# Patient Record
Sex: Female | Born: 1945 | Race: Black or African American | Hispanic: No | Smoking: Never smoker
Health system: Southern US, Community
[De-identification: ages and names within clinical notes are randomized; demographics above are authoritative.]

## PROBLEM LIST (undated history)

## (undated) DIAGNOSIS — E119 Type 2 diabetes mellitus without complications: Secondary | ICD-10-CM

## (undated) DIAGNOSIS — E7800 Pure hypercholesterolemia, unspecified: Secondary | ICD-10-CM

## (undated) DIAGNOSIS — N179 Acute kidney failure, unspecified: Secondary | ICD-10-CM

## (undated) DIAGNOSIS — D649 Anemia, unspecified: Secondary | ICD-10-CM

## (undated) DIAGNOSIS — I10 Essential (primary) hypertension: Secondary | ICD-10-CM

## (undated) HISTORY — DX: Essential (primary) hypertension: I10

## (undated) HISTORY — DX: Anemia, unspecified: D64.9

## (undated) HISTORY — DX: Type 2 diabetes mellitus without complications: E11.9

## (undated) HISTORY — DX: Acute kidney failure, unspecified: N17.9

## (undated) HISTORY — PX: OOPHORECTOMY: SHX86

---

## 1973-10-17 HISTORY — PX: TUBAL LIGATION: SHX77

## 1977-10-17 HISTORY — PX: MICROLARYNGOSCOPY WITH CO2 LASER AND EXCISION OF VOCAL CORD LESION: SHX5970

## 1979-10-18 HISTORY — PX: ABDOMINAL HYSTERECTOMY: SHX81

## 1999-10-18 DIAGNOSIS — E119 Type 2 diabetes mellitus without complications: Secondary | ICD-10-CM

## 1999-10-18 HISTORY — DX: Type 2 diabetes mellitus without complications: E11.9

## 2003-10-18 HISTORY — PX: BREAST BIOPSY: SHX20

## 2007-05-21 ENCOUNTER — Ambulatory Visit: Payer: Self-pay | Admitting: Internal Medicine

## 2007-08-22 ENCOUNTER — Ambulatory Visit: Payer: Self-pay | Admitting: Internal Medicine

## 2008-04-14 ENCOUNTER — Ambulatory Visit: Payer: Self-pay | Admitting: Internal Medicine

## 2008-05-29 ENCOUNTER — Ambulatory Visit: Payer: Self-pay | Admitting: Gastroenterology

## 2010-01-21 ENCOUNTER — Ambulatory Visit: Payer: Self-pay | Admitting: Internal Medicine

## 2011-01-27 ENCOUNTER — Ambulatory Visit: Payer: Self-pay | Admitting: Internal Medicine

## 2013-06-03 ENCOUNTER — Ambulatory Visit: Payer: Self-pay | Admitting: Internal Medicine

## 2013-10-17 HISTORY — PX: BREAST BIOPSY: SHX20

## 2013-10-31 ENCOUNTER — Ambulatory Visit: Payer: Self-pay | Admitting: Internal Medicine

## 2013-11-12 ENCOUNTER — Encounter: Payer: Self-pay | Admitting: *Deleted

## 2013-12-05 ENCOUNTER — Encounter: Payer: Self-pay | Admitting: General Surgery

## 2013-12-05 ENCOUNTER — Ambulatory Visit (INDEPENDENT_AMBULATORY_CARE_PROVIDER_SITE_OTHER): Payer: Medicare Other | Admitting: General Surgery

## 2013-12-05 VITALS — BP 138/74 | HR 86 | Resp 14 | Ht 63.0 in | Wt 142.0 lb

## 2013-12-05 DIAGNOSIS — D249 Benign neoplasm of unspecified breast: Secondary | ICD-10-CM

## 2013-12-05 NOTE — Patient Instructions (Signed)
Continue self breast exams. Call office for any new breast issues or concerns. 

## 2013-12-05 NOTE — Progress Notes (Signed)
Patient ID: Patricia Cross, female   DOB: [birth year], 68 y.o.   MRN: [id number]  Chief Complaint  Patient presents with  . Breast Problem    abnormal mammogram    HPI Patricia Cross is a 69 y.o. female.  who presents for a breast evaluation. The most recent mammogram was done on 11-01-13. An ultrasound was done as well. Patient does perform regular self breast checks and gets regular mammograms done.  No family history of breast cancer.States she felt the "hard spot" right breast back around Christmas and it has not change in size. Denies pain or any other breast symptoms.  HPI  Past Medical History  Diagnosis Date  . Diabetes mellitus without complication   . Hypertension   . Anemia     Past Surgical History  Procedure Laterality Date  . Abdominal hysterectomy  1981  . Tubal ligation  1975  . Microlaryngoscopy with co2 laser and excision of vocal cord lesion  1979  . Breast biopsy Left 2005    Family History  Problem Relation Age of Onset  . Cancer Daughter 67    pancreatic    Social History History  Substance Use Topics  . Smoking status: Never Smoker   . Smokeless tobacco: Not on file  . Alcohol Use: No    Allergies not on file  Current Outpatient Prescriptions  Medication Sig Dispense Refill  . amLODipine (NORVASC) 5 MG tablet Take 5 mg by mouth daily.       Marland Kitchen glyBURIDE (DIABETA) 5 MG tablet Take 5 mg by mouth 3 (three) times daily.       Marland Kitchen losartan-hydrochlorothiazide (HYZAAR) 100-25 MG per tablet Take 1 tablet by mouth daily.       . metFORMIN (GLUCOPHAGE) 1000 MG tablet Take 1,000 mg by mouth 2 (two) times daily with a meal.       . ONGLYZA 5 MG TABS tablet 5 mg daily.       . pantoprazole (PROTONIX) 40 MG tablet Take 40 mg by mouth daily.       Marland Kitchen PREMARIN 0.45 MG tablet Take 0.45 mg by mouth daily.       . simvastatin (ZOCOR) 80 MG tablet Take 80 mg by mouth daily at 6 PM.       . SUPREP BOWEL PREP SOLN        No current facility-administered  medications for this visit.    Review of Systems Review of Systems  Constitutional: Negative.   Respiratory: Negative.   Cardiovascular: Negative.     Blood pressure 138/74, pulse 86, resp. rate 14, height 5\' 3"  (1.6 m), weight 142 lb (64.411 kg).  Physical Exam Physical Exam  Constitutional: She is oriented to person, place, and time. She appears well-developed and well-nourished.  Neck: Neck supple.  Cardiovascular: Normal rate, regular rhythm and normal heart sounds.   Pulmonary/Chest: Effort normal and breath sounds normal. Right breast exhibits mass. Right breast exhibits no inverted nipple, no nipple discharge, no skin change and no tenderness. Left breast exhibits no inverted nipple, no mass, no nipple discharge, no skin change and no tenderness.  Right breast nodule 4 o'clock   Lymphadenopathy:    She has no cervical adenopathy.  Neurological: She is alert and oriented to person, place, and time.  Skin: Skin is warm and dry.    Data Reviewed 08/22/2007 bilateral screening mammograms were reviewed. This shows a 0.8 x 1.2 x 1.25 cm well-circumscribed subcutaneous nodule in the right retroareolar tissue. BI-RAD-3 10/31/2013 right breast  diagnostic mammogram shows a 1.2 x 1.95 x 1.95 slightly lobulated subcutaneous mass in the right retroareolar tissue. BI-RAD-3   Assessment    Right retroareolar fibroadenoma, now palpable. Modest change from 1.2-1.95 cm over 9 years.     Plan    The patient had been encouraged to have a followup exam in 6 months. She is now anxious that there is palpable. She was reassured that this is likely a benign lesion but desired to proceed to excision. This can safely be accomplished as an office procedure.       Robert Bellow 12/06/2013, 12:30 PM

## 2013-12-06 DIAGNOSIS — D249 Benign neoplasm of unspecified breast: Secondary | ICD-10-CM | POA: Insufficient documentation

## 2013-12-23 ENCOUNTER — Ambulatory Visit (INDEPENDENT_AMBULATORY_CARE_PROVIDER_SITE_OTHER): Payer: Medicare Other | Admitting: General Surgery

## 2013-12-23 ENCOUNTER — Encounter: Payer: Self-pay | Admitting: General Surgery

## 2013-12-23 VITALS — BP 130/62 | HR 74 | Resp 13 | Ht 63.0 in | Wt 139.0 lb

## 2013-12-23 DIAGNOSIS — N63 Unspecified lump in unspecified breast: Secondary | ICD-10-CM

## 2013-12-23 NOTE — Progress Notes (Signed)
Patient ID: Patricia Cross, female   DOB: 1946/01/09, 68 y.o.   MRN: [id number]  Chief Complaint  Patient presents with  . Procedure    right breast mass    HPI Patricia Cross is a 68 y.o. female here today for an right breast excision.   HPI  Past Medical History  Diagnosis Date  . Diabetes mellitus without complication   . Hypertension   . Anemia     Past Surgical History  Procedure Laterality Date  . Abdominal hysterectomy  1981  . Tubal ligation  1975  . Microlaryngoscopy with co2 laser and excision of vocal cord lesion  1979  . Breast biopsy Left 2005    Family History  Problem Relation Age of Onset  . Cancer Daughter 29    pancreatic    Social History History  Substance Use Topics  . Smoking status: Never Smoker   . Smokeless tobacco: Never Used  . Alcohol Use: No    No Known Allergies  Current Outpatient Prescriptions  Medication Sig Dispense Refill  . amLODipine (NORVASC) 5 MG tablet Take 5 mg by mouth daily.       Marland Kitchen glyBURIDE (DIABETA) 5 MG tablet Take 5 mg by mouth 3 (three) times daily.       Marland Kitchen losartan-hydrochlorothiazide (HYZAAR) 100-25 MG per tablet Take 1 tablet by mouth daily.       . metFORMIN (GLUCOPHAGE) 1000 MG tablet Take 1,000 mg by mouth 2 (two) times daily with a meal.       . ONGLYZA 5 MG TABS tablet 5 mg daily.       . pantoprazole (PROTONIX) 40 MG tablet Take 40 mg by mouth daily.       Marland Kitchen PREMARIN 0.45 MG tablet Take 0.45 mg by mouth daily.       . simvastatin (ZOCOR) 80 MG tablet Take 80 mg by mouth daily at 6 PM.       . SUPREP BOWEL PREP SOLN        No current facility-administered medications for this visit.    Review of Systems Review of Systems  Constitutional: Negative.   Respiratory: Negative.   Cardiovascular: Negative.     Blood pressure 130/62, pulse 74, resp. rate 13, height 5\' 3"  (1.6 m), weight 139 lb (63.05 kg).  Physical Exam Physical Exam  Pulmonary/Chest: Right breast exhibits mass (1.5 cm massin the  edge of the areola at the 4 o'clock position of the right breast. Confirmed by palpation with the patient prior to the instillation of local anesthesia.).    Data Reviewed Previous ultrasound examinations.  Assessment    Fibroadenoma of the right breast.     Plan    The patient desired to proceed with excision. The area was marked after confirming the area of palpable concern with the patient. 20 cc of 0.5% Xylocaine with 0.25% Marcaine with 1-200,000 units of epinephrine was used. Chlor prep was applied to the skin. 3 cc of 1% plain Xylocaine was then instilled to achieve excellent anesthesia. A small incision along the edge of the areola was made and carried down through skin and subcutaneous tissue. The nodular tissue was excised sharply and orientated, short suture medially, long suture superiorly and placed in formalin for routine histology. The deep tissue was approximated with 3-0 Vicryl suture. The skin was closed with a running 3-0 Vicryl subcuticular suture. Benzoin and Steri-Strips followed by Telfa Tegaderm dressing was applied. The procedure was well tolerated. Home going instruction sheet provided.  A  prescription for Norco 5/325, #20 with the inscription 1 p.o. Q.4 h. P.r.n. For pain with no refills was provided.  The patient will follow up in one week for wound examination with the staff.       Robert Bellow 12/23/2013, 10:04 PM

## 2013-12-23 NOTE — Patient Instructions (Addendum)
CARE AFTER BREAST  Excision  1. Leave the dressing on that your doctor applied after surgery. It is waterproof. You may bathe, shower and/or swim. The dressing will probably remain intact until your return office visit. If the dressing comes off, you will see small strips of tape against your skin on the incision. Do not remove these strips.  2. You may want to use a gauze,cloth or similar protection in your bra to prevent rubbing against your dressing and incision. This is not necessary, but you may feel more comfortable doing so.  3. It is recommended that you wear a bra day and night to give support to the breast. This will prevent the weight of the breast from pulling on the incision.  4. Your breast will feel hard and lumpy under the incision. Do not be alarmed. This is the underlying stitching of tissue. Softening of this tissue will occur in time.  5. Make sure you call the office and schedule an appointment in one week after your surgery. The office phone number is [phone]. The [facility] may have already done this for you.  6. You will notice about a week after your office visit that the strips of the tape on your incision will begin to loosen. These may then be removed.  7. Report to your doctor any of the following:  * Severe pain not relieved by your pain medication  *Redness of the incision  * Drainage from the incision  *Fever greater than 101 degrees 

## 2013-12-24 LAB — PATHOLOGY

## 2013-12-26 ENCOUNTER — Telehealth: Payer: Self-pay | Admitting: *Deleted

## 2013-12-26 NOTE — Telephone Encounter (Signed)
Notified patient as instructed, no cancer noted, patient pleased. Discussed follow-up appointments, patient agrees

## 2013-12-30 ENCOUNTER — Ambulatory Visit (INDEPENDENT_AMBULATORY_CARE_PROVIDER_SITE_OTHER): Payer: Medicare Other | Admitting: *Deleted

## 2013-12-30 DIAGNOSIS — N63 Unspecified lump in unspecified breast: Secondary | ICD-10-CM

## 2013-12-30 NOTE — Progress Notes (Signed)
Patient came in today for a wound check.  The wound is clean, with no signs of infection noted. .Follow up as scheduled.  

## 2014-01-06 ENCOUNTER — Telehealth: Payer: Self-pay | Admitting: *Deleted

## 2014-01-06 NOTE — Telephone Encounter (Signed)
Left message that patient is to continue screening mammograms yearly with her PCP.

## 2014-01-27 ENCOUNTER — Ambulatory Visit: Payer: Self-pay | Admitting: Internal Medicine

## 2014-01-27 LAB — CANCER CENTER HEMOGLOBIN: HGB: 10.5 g/dL — ABNORMAL LOW (ref 12.0–16.0)

## 2014-01-27 LAB — FERRITIN: FERRITIN (ARMC): 7 ng/mL — AB (ref 8–388)

## 2014-02-14 ENCOUNTER — Ambulatory Visit: Payer: Self-pay | Admitting: Internal Medicine

## 2014-02-14 LAB — OCCULT BLOOD X 1 CARD TO LAB, STOOL
Occult Blood, Feces: NEGATIVE
Occult Blood, Feces: NEGATIVE
Occult Blood, Feces: NEGATIVE

## 2014-02-17 LAB — CANCER CENTER HEMOGLOBIN: HGB: 10.1 g/dL — AB (ref 12.0–16.0)

## 2014-03-17 ENCOUNTER — Ambulatory Visit: Payer: Self-pay | Admitting: Internal Medicine

## 2014-03-19 LAB — CANCER CENTER HEMOGLOBIN: HGB: 10.9 g/dL — ABNORMAL LOW (ref 12.0–16.0)

## 2014-04-16 ENCOUNTER — Ambulatory Visit: Payer: Self-pay | Admitting: Internal Medicine

## 2014-06-19 ENCOUNTER — Ambulatory Visit: Payer: Self-pay | Admitting: Internal Medicine

## 2014-08-18 ENCOUNTER — Encounter: Payer: Self-pay | Admitting: General Surgery

## 2014-08-28 IMAGING — MG MM MAMMO DIAGNOSTIC UNILATERAL*R*
1 series · 3 of 3 positions shown · non-contrast
Comparison: Priors

CLINICAL DATA: Patient presents for evaluation of palpable
abnormality within the right breast.

EXAM:
DIGITAL DIAGNOSTIC  RIGHT MAMMOGRAM WITH CAD
ULTRASOUND RIGHT BREAST

[R CC · right · 3 of 3 slices shown]
[im 1/3]
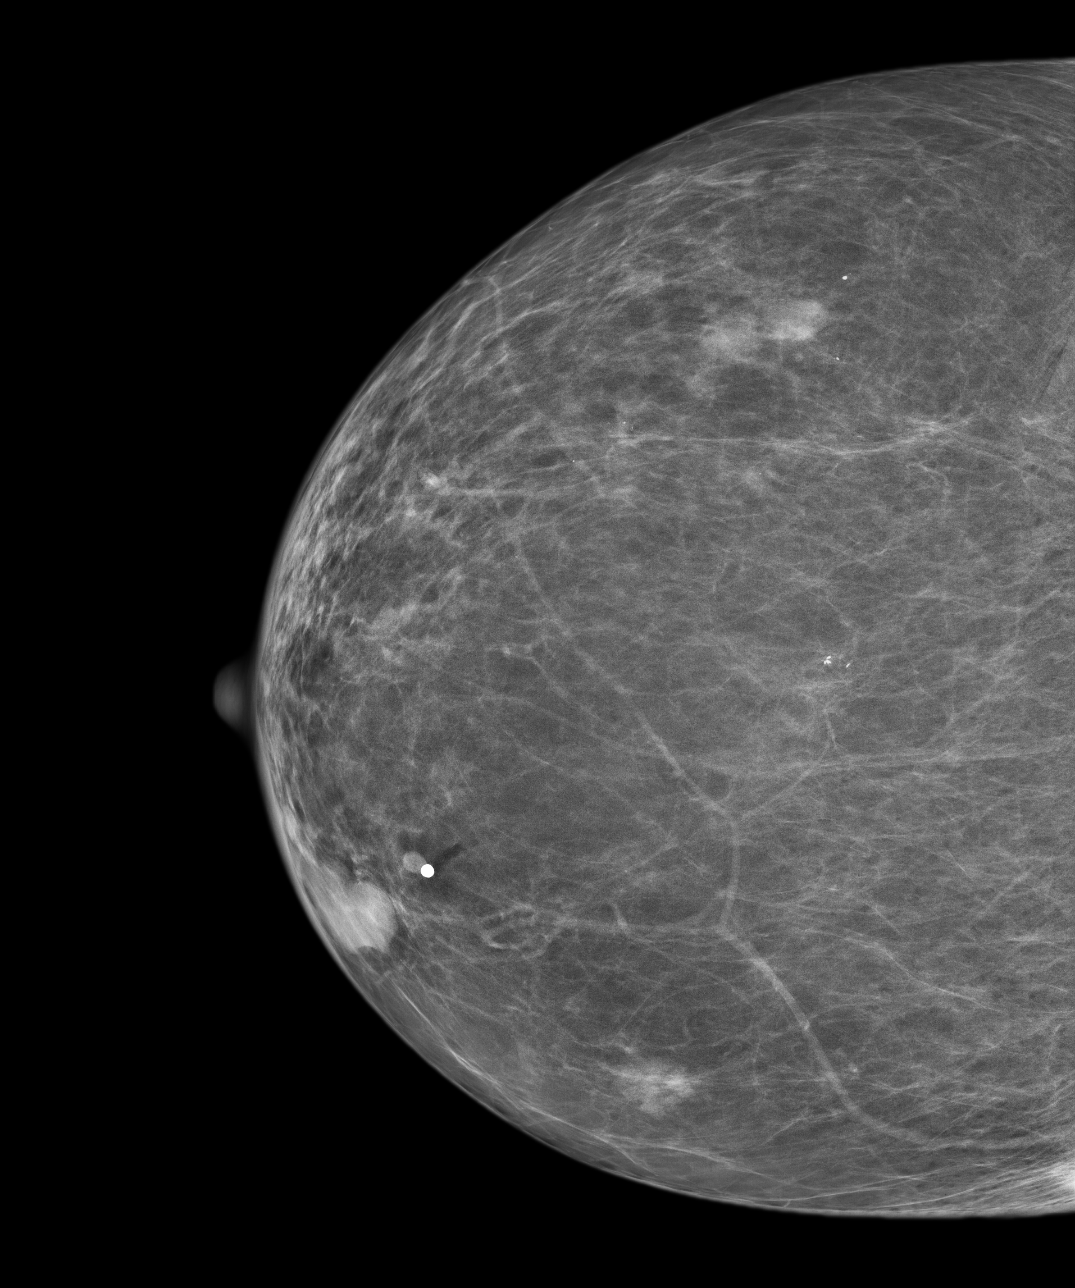
[im 2/3]
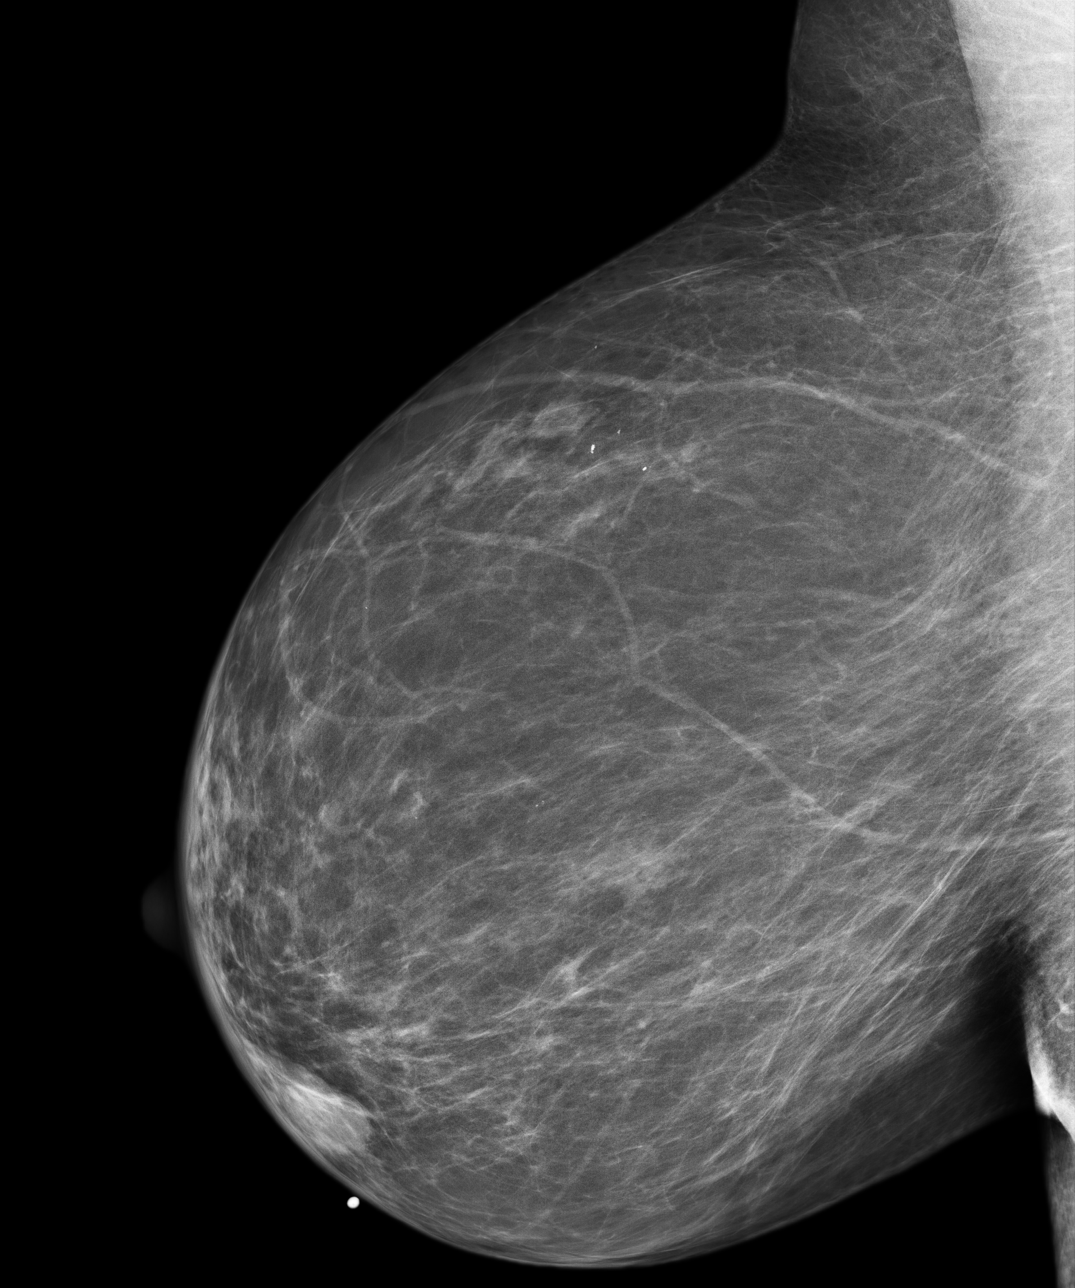
[im 3/3]
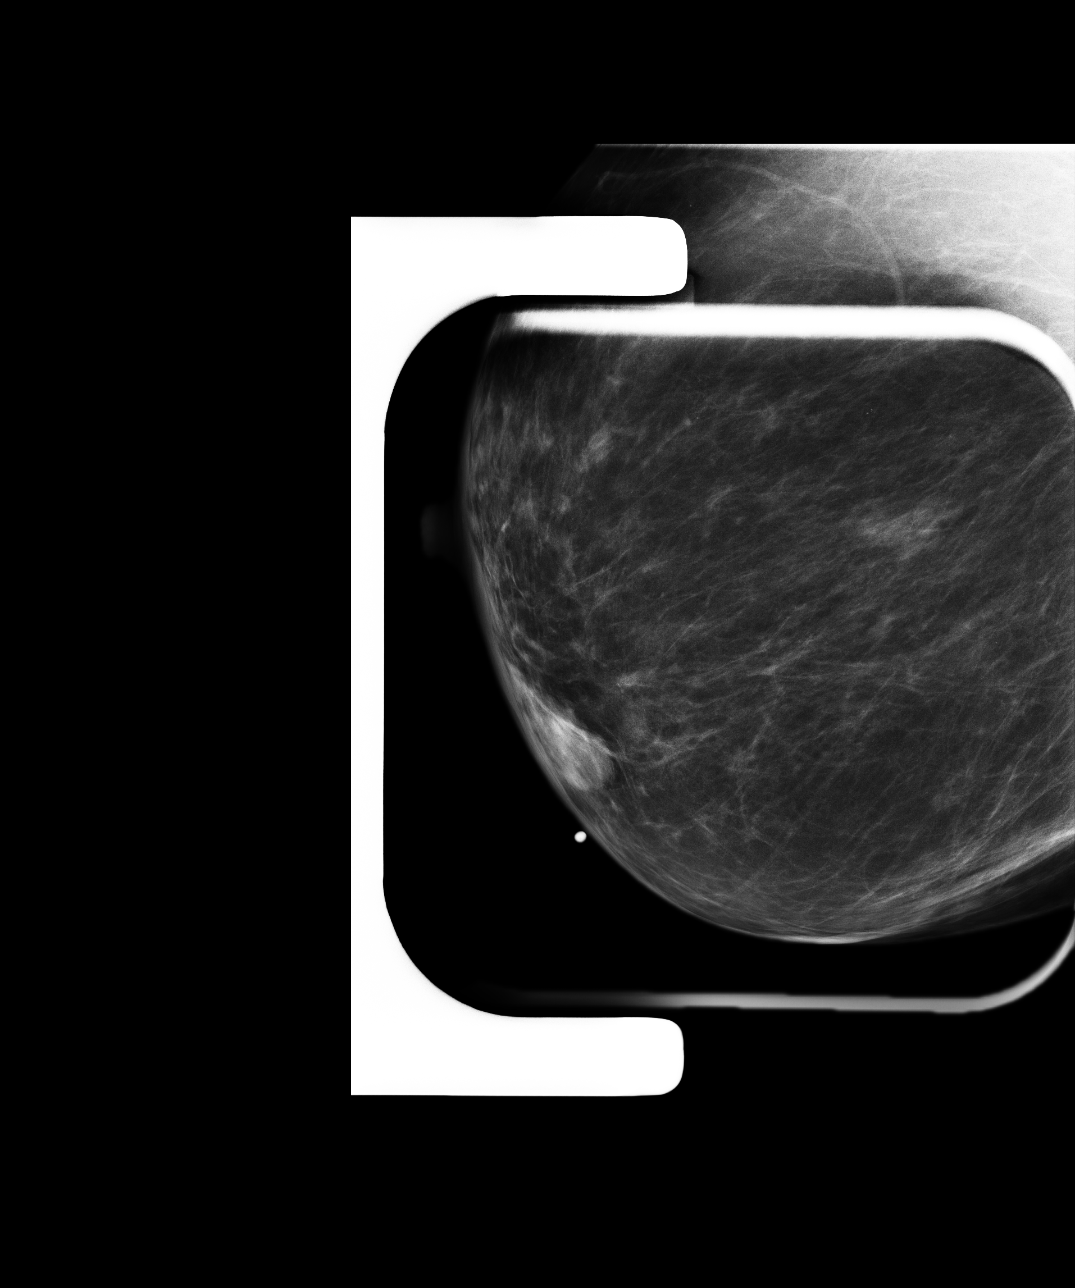

[3 of 3 positions shown; findings below may reference images not displayed]

ACR Breast Density Category b: There are scattered areas of
fibroglandular density.
FINDINGS: Within the right breast underlying the palpable marker there is a
2.0 cm circumscribed oval mass. This mass is stable dating back to
08/22/2007. This is compatible with a benign process.

Mammographic images were processed with CAD.

On physical exam, I palpate a soft mass underlying the skin within
the right breast.

Ultrasound is performed, showing a 1.5 x 0.7 x 1.7 cm circumscribed
hypoechoic mass within the right breast 4 o'clock position 1 cm from
the nipple.
IMPRESSION: Palpable mass is stable on mammogram dating back to 1006 compatible
with a benign etiology.

The stability of this mass and the fact this represents a benign
etiology given stability were discussed with the patient. She stated
that she would like to have a repeat ultrasound in 6 months.

RECOMMENDATION:
It was discussed with the patient that she could return to bilateral
screening mammography however she states she desires to have a
followup ultrasound of this mass in 6 months.

I have discussed the findings and recommendations with the patient.
Results were also provided in writing at the conclusion of the
visit.

BI-RADS CATEGORY  3: Probably benign finding(s) - short interval
follow-up suggested.

## 2015-01-29 ENCOUNTER — Ambulatory Visit
Admit: 2015-01-29 | Disposition: A | Discharge status: Home / Self Care | Payer: Self-pay | Attending: Internal Medicine | Admitting: Internal Medicine

## 2016-01-06 ENCOUNTER — Encounter: Payer: Self-pay | Admitting: *Deleted

## 2016-01-07 ENCOUNTER — Encounter
Admission: RE | Disposition: A | Discharge status: Home / Self Care | Payer: Self-pay | Source: Ambulatory Visit | Attending: Gastroenterology

## 2016-01-07 ENCOUNTER — Encounter: Payer: Self-pay | Admitting: Gastroenterology

## 2016-01-07 ENCOUNTER — Ambulatory Visit: Payer: Medicare Other | Admitting: Anesthesiology

## 2016-01-07 ENCOUNTER — Ambulatory Visit
Admission: RE | Admit: 2016-01-07 | Discharge: 2016-01-07 | Disposition: A | Discharge status: Home / Self Care | Payer: Medicare Other | Source: Ambulatory Visit | Attending: Gastroenterology | Admitting: Gastroenterology

## 2016-01-07 DIAGNOSIS — Z7984 Long term (current) use of oral hypoglycemic drugs: Secondary | ICD-10-CM | POA: Insufficient documentation

## 2016-01-07 DIAGNOSIS — Z9889 Other specified postprocedural states: Secondary | ICD-10-CM | POA: Diagnosis not present

## 2016-01-07 DIAGNOSIS — E119 Type 2 diabetes mellitus without complications: Secondary | ICD-10-CM | POA: Insufficient documentation

## 2016-01-07 DIAGNOSIS — Z800 Family history of malignant neoplasm of digestive organs: Secondary | ICD-10-CM | POA: Diagnosis not present

## 2016-01-07 DIAGNOSIS — I10 Essential (primary) hypertension: Secondary | ICD-10-CM | POA: Insufficient documentation

## 2016-01-07 DIAGNOSIS — Z86010 Personal history of colonic polyps: Secondary | ICD-10-CM | POA: Insufficient documentation

## 2016-01-07 DIAGNOSIS — Z8719 Personal history of other diseases of the digestive system: Secondary | ICD-10-CM | POA: Insufficient documentation

## 2016-01-07 DIAGNOSIS — Z90710 Acquired absence of both cervix and uterus: Secondary | ICD-10-CM | POA: Insufficient documentation

## 2016-01-07 DIAGNOSIS — E7800 Pure hypercholesterolemia, unspecified: Secondary | ICD-10-CM | POA: Insufficient documentation

## 2016-01-07 DIAGNOSIS — R159 Full incontinence of feces: Secondary | ICD-10-CM | POA: Diagnosis not present

## 2016-01-07 HISTORY — DX: Pure hypercholesterolemia, unspecified: E78.00

## 2016-01-07 HISTORY — PX: COLONOSCOPY WITH PROPOFOL: SHX5780

## 2016-01-07 LAB — GLUCOSE, CAPILLARY: Glucose-Capillary: 132 mg/dL — ABNORMAL HIGH (ref 65–99)

## 2016-01-07 SURGERY — COLONOSCOPY WITH PROPOFOL
Anesthesia: General

## 2016-01-07 MED ORDER — SODIUM CHLORIDE 0.9 % IV SOLN: INTRAVENOUS | Status: DC

## 2016-01-07 MED ORDER — PROPOFOL 500 MG/50ML IV EMUL
INTRAVENOUS | Status: DC | PRN
  Administered 2016-01-07: 100 ug/kg/min via INTRAVENOUS

## 2016-01-07 MED ORDER — PROPOFOL 10 MG/ML IV BOLUS
INTRAVENOUS | Status: DC | PRN
  Administered 2016-01-07: 80 mg via INTRAVENOUS

## 2016-01-07 MED ORDER — SODIUM CHLORIDE 0.9 % IV SOLN
INTRAVENOUS | Status: DC
Start: 2016-01-07 — End: 2016-01-07
  Administered 2016-01-07: 1000 mL via INTRAVENOUS

## 2016-01-07 NOTE — Transfer of Care (Signed)
Immediate Anesthesia Transfer of Care Note  Patient: Patricia Cross  Procedure(s) Performed: Procedure(s): COLONOSCOPY WITH PROPOFOL (N/A)  Patient Location: endo  Anesthesia Type:General  Level of Consciousness: sedated and responds to stimulation  Airway & Oxygen Therapy: Patient Spontanous Breathing and Patient connected to nasal cannula oxygen  Post-op Assessment: Report given to RN and Post -op Vital signs reviewed and stable  Post vital signs: Reviewed and stable  Last Vitals:  Filed Vitals:   01/07/16 0813 01/07/16 0943  BP: 141/58 120/54  Pulse: 97 83  Temp: 35.9 C 36.2 C  Resp: 16 18    Complications: No apparent anesthesia complications

## 2016-01-07 NOTE — H&P (Signed)
    Primary Care Physician:  Perrin Maltese, MD Primary Gastroenterologist:  Dr. Candace Cruise  Pre-Procedure History & Physical: HPI:  Patricia Cross is a 70 y.o. female is here for an colonoscopy  Past Medical History  Diagnosis Date  . Diabetes mellitus without complication ([place])   . Hypertension   . Anemia   . High cholesterol     Past Surgical History  Procedure Laterality Date  . Abdominal hysterectomy  1981  . Tubal ligation  1975  . Microlaryngoscopy with co2 laser and excision of vocal cord lesion  1979  . Breast biopsy Left 2005    Prior to Admission medications   Medication Sig Start Date End Date Taking? Authorizing Provider  amLODipine (NORVASC) 5 MG tablet Take 5 mg by mouth daily.  12/02/13  Yes Historical Provider, MD  glipiZIDE (GLUCOTROL) 10 MG tablet Take 10 mg by mouth 2 (two) times daily before a meal.   Yes Historical Provider, MD  losartan-hydrochlorothiazide (HYZAAR) 100-25 MG per tablet Take 1 tablet by mouth daily.  12/02/13  Yes Historical Provider, MD  metFORMIN (GLUCOPHAGE) 1000 MG tablet Take 1,000 mg by mouth 2 (two) times daily with a meal.  12/02/13  Yes Historical Provider, MD  ONGLYZA 5 MG TABS tablet 5 mg daily.  11/23/13  Yes Historical Provider, MD  pantoprazole (PROTONIX) 40 MG tablet Take 40 mg by mouth daily.  12/03/13  Yes Historical Provider, MD  PREMARIN 0.45 MG tablet Take 0.45 mg by mouth daily.  11/22/13  Yes Historical Provider, MD  simvastatin (ZOCOR) 80 MG tablet Take 80 mg by mouth daily at 6 PM.  11/23/13  Yes Historical Provider, MD  [place]  11/26/13  Yes Historical Provider, MD    Allergies as of 12/24/2015  . (No Known Allergies)    Family History  Problem Relation Age of Onset  . Cancer Daughter 36    pancreatic    Social History   Social History  . Marital Status: Widowed    Spouse Name: N/A  . Number of Children: N/A  . Years of Education: N/A   Occupational History  . Not on file.   Social History Main  Topics  . Smoking status: Never Smoker   . Smokeless tobacco: Never Used  . Alcohol Use: No  . Drug Use: No  . Sexual Activity: Not on file   Other Topics Concern  . Not on file   Social History Narrative    Review of Systems: See HPI, otherwise negative ROS  Physical Exam: BP 141/58 mmHg  Pulse 97  Temp(Src) 96.6 F (35.9 C) (Tympanic)  Resp 16  Ht 5\' 3"  (1.6 m)  Wt 62.596 kg (138 lb)  BMI 24.45 kg/m2  SpO2 99% General:   Alert,  pleasant and cooperative in NAD Head:  Normocephalic and atraumatic. Neck:  Supple; no masses or thyromegaly. Lungs:  Clear throughout to auscultation.    Heart:  Regular rate and rhythm. Abdomen:  Soft, nontender and nondistended. Normal bowel sounds, without guarding, and without rebound.   Neurologic:  Alert and  oriented x4;  grossly normal neurologically.  Impression/Plan: Patricia Cross is here for an colonoscopy to be performed for fecal incontinence  Risks, benefits, limitations, and alternatives regarding  colonoscopy} have been reviewed with the patient.  Questions have been answered.  All parties agreeable.   Patricia Cross, Patricia Dawn, MD  01/07/2016, 8:31 AM

## 2016-01-07 NOTE — Anesthesia Postprocedure Evaluation (Signed)
Anesthesia Post Note  Patient: Patricia Cross  Procedure(s) Performed: Procedure(s) (LRB): COLONOSCOPY WITH PROPOFOL (N/A)  Patient location during evaluation: PACU Anesthesia Type: General Level of consciousness: awake and alert Pain management: pain level controlled Vital Signs Assessment: post-procedure vital signs reviewed and stable Respiratory status: spontaneous breathing and respiratory function stable Cardiovascular status: stable Anesthetic complications: no    Last Vitals:  Filed Vitals:   01/07/16 1010 01/07/16 1020  BP:  148/66  Pulse: 67 78  Temp:    Resp:      Last Pain: There were no vitals filed for this visit.               KEPHART,WILLIAM K

## 2016-01-07 NOTE — Op Note (Signed)
[facility] Patient Name: Patricia Cross Procedure Date: 01/07/2016 9:17 AM MRN: [id number] Account #: [id number] Date of Birth: [birth year] Admit Type: Outpatient Age: 70 Room: [facility] ENDO ROOM 4 Gender: Female Note Status: Finalized Procedure:            Colonoscopy Indications:          Chronic diarrhea, fecal incontinence Providers:            Lupita Dawn. Candace Cruise, MD Referring MD:         Perrin Maltese, MD (Referring MD) Medicines:            Previous colon biopsy suggests lymphocytic colitis,                        Monitored Anesthesia Care Complications:        No immediate complications. Procedure:            Pre-Anesthesia Assessment:                       - Prior to the procedure, a History and Physical was                        performed, and patient medications, allergies and                        sensitivities were reviewed. The patient's tolerance of                        previous anesthesia was reviewed.                       - The risks and benefits of the procedure and the                        sedation options and risks were discussed with the                        patient. All questions were answered and informed                        consent was obtained.                       - After reviewing the risks and benefits, the patient                        was deemed in satisfactory condition to undergo the                        procedure.                       After obtaining informed consent, the colonoscope was                        passed under direct vision. Throughout the procedure,                        the patient's blood pressure, pulse, and oxygen  saturations were monitored continuously. The                        Colonoscope was introduced through the anus and                        advanced to the the cecum, identified by appendiceal                        orifice and ileocecal valve. The  colonoscopy was                        performed with moderate difficulty due to restricted                        mobility of the colon. The patient tolerated the                        procedure well. The quality of the bowel preparation                        was good. Findings:      The colon (entire examined portion) appeared normal. Biopsies for       histology were taken with a cold forceps from the entire colon for       evaluation of microscopic colitis.      Decreased sphincter tone as well. Impression:           - The entire examined colon is normal. Biopsied. Recommendation:       - Discharge patient to home.                       - Await pathology results.                       - The findings and recommendations were discussed with                        the patient. Procedure Code(s):    --- Professional ---                       [id number], Colonoscopy, flexible; with biopsy, single or                        multiple Diagnosis Code(s):    --- Professional ---                       K52.9, Noninfective gastroenteritis and colitis,                        unspecified CPT copyright 2016 [employer] Medical Association. All rights reserved. The codes documented in this report are preliminary and upon coder review may  be revised to meet current compliance requirements. Hulen Luster, MD 01/07/2016 9:41:20 AM This report has been signed electronically. Number of Addenda: 0 Note Initiated On: 01/07/2016 9:17 AM Scope Withdrawal Time: 0 hours 5 minutes 17 seconds  Total Procedure Duration: 0 hours 15 minutes 29 seconds       [facility]

## 2016-01-07 NOTE — Anesthesia Preprocedure Evaluation (Signed)
Anesthesia Evaluation  Patient identified by MRN, date of birth, ID band Patient awake    Reviewed: Allergy & Precautions, NPO status , Patient's Chart, lab work & pertinent test results  History of Anesthesia Complications Negative for: history of anesthetic complications  Airway Mallampati: II       Dental   Pulmonary neg pulmonary ROS,           Cardiovascular hypertension, Pt. on medications      Neuro/Psych negative neurological ROS     GI/Hepatic negative GI ROS, Neg liver ROS,   Endo/Other  diabetes, Oral Hypoglycemic Agents  Renal/GU negative Renal ROS     Musculoskeletal   Abdominal   Peds  Hematology  (+) anemia ,   Anesthesia Other Findings   Reproductive/Obstetrics                             Anesthesia Physical Anesthesia Plan  ASA: III  Anesthesia Plan: General   Post-op Pain Management:    Induction: Intravenous  Airway Management Planned:   Additional Equipment:   Intra-op Plan:   Post-operative Plan:   Informed Consent: I have reviewed the patients History and Physical, chart, labs and discussed the procedure including the risks, benefits and alternatives for the proposed anesthesia with the patient or authorized representative who has indicated his/her understanding and acceptance.     Plan Discussed with:   Anesthesia Plan Comments:         Anesthesia Quick Evaluation

## 2016-01-08 LAB — SURGICAL PATHOLOGY

## 2016-01-19 ENCOUNTER — Ambulatory Visit: Payer: Self-pay | Admitting: General Surgery

## 2016-02-03 ENCOUNTER — Encounter: Payer: Self-pay | Admitting: General Surgery

## 2016-02-03 ENCOUNTER — Ambulatory Visit (INDEPENDENT_AMBULATORY_CARE_PROVIDER_SITE_OTHER): Payer: Medicare Other | Admitting: General Surgery

## 2016-02-03 VITALS — BP 150/76 | HR 66 | Resp 14 | Ht 63.0 in | Wt 127.0 lb

## 2016-02-03 DIAGNOSIS — R159 Full incontinence of feces: Secondary | ICD-10-CM

## 2016-02-03 LAB — POC HEMOCCULT BLD/STL (OFFICE/1-CARD/DIAGNOSTIC): FECAL OCCULT BLD: NEGATIVE

## 2016-02-03 NOTE — Progress Notes (Signed)
Patient ID: Patricia Cross, female   DOB: [birth year], 70 y.o.   MRN: [id number]  Chief Complaint  Patient presents with  . Follow-up    colitis    HPI Patricia Cross is a 70 y.o. female here today folllowing up from a colonoscopy done on 01/07/16 by Dr. Verdie Shire showing possible colitis. She states she has stool leakage, she states "spasmatic feces" for at least 3 years spratically. She states it can be a smear or balls. Denies any bleeding.  Denies abdominal pain. Bowels move daily and mostly after every meal. Her husband had a heart transplant in 2001 and passed in 2015. I personally reviewed the patient's history. HPI  Past Medical History  Diagnosis Date  . Hypertension   . Anemia   . High cholesterol   . Diabetes mellitus without complication ([place]) 99991111    Past Surgical History  Procedure Laterality Date  . Abdominal hysterectomy  1981  . Tubal ligation  1975  . Microlaryngoscopy with co2 laser and excision of vocal cord lesion  1979  . Breast biopsy Left 2005  . Colonoscopy with propofol N/A 01/07/2016    Procedure: COLONOSCOPY WITH PROPOFOL;  Surgeon: Hulen Luster, MD;  Location: [facility] ENDOSCOPY;  Service: Gastroenterology;  Laterality: N/A;    Family History  Problem Relation Age of Onset  . Cancer Daughter 56    pancreatic    Social History Social History  Substance Use Topics  . Smoking status: Never Smoker   . Smokeless tobacco: Never Used  . Alcohol Use: No    No Known Allergies  Current Outpatient Prescriptions  Medication Sig Dispense Refill  . amLODipine (NORVASC) 5 MG tablet Take 5 mg by mouth daily.     Marland Kitchen glipiZIDE (GLUCOTROL) 10 MG tablet Take 10 mg by mouth 2 (two) times daily before a meal.    . losartan-hydrochlorothiazide (HYZAAR) 100-25 MG per tablet Take 1 tablet by mouth daily.     . metFORMIN (GLUCOPHAGE) 1000 MG tablet Take 1,000 mg by mouth 2 (two) times daily with a meal.     . ONGLYZA 5 MG TABS tablet 5 mg daily.     . pantoprazole  (PROTONIX) 40 MG tablet Take 40 mg by mouth daily.     . simvastatin (ZOCOR) 80 MG tablet Take 80 mg by mouth daily at 6 PM.     . SUPREP BOWEL PREP SOLN      No current facility-administered medications for this visit.    Review of Systems Review of Systems  Constitutional: Negative.   Respiratory: Negative.   Cardiovascular: Negative.   Gastrointestinal: Negative for nausea, vomiting, diarrhea, constipation and blood in stool.    Blood pressure 150/76, pulse 66, resp. rate 14, height 5\' 3"  (1.6 m), weight 127 lb (57.607 kg).  Physical Exam Physical Exam  Constitutional: She is oriented to person, place, and time. She appears well-developed and well-nourished.  HENT:  Mouth/Throat: Oropharynx is clear and moist.  Eyes: Conjunctivae are normal. No scleral icterus.  Neck: Neck supple.  Cardiovascular: Normal rate, regular rhythm and normal heart sounds.   Pulmonary/Chest: Effort normal and breath sounds normal.  Abdominal: Soft. Normal appearance and bowel sounds are normal. There is no tenderness.  Genitourinary: Rectal exam shows no fissure, no mass, no tenderness and anal tone normal. Guaiac negative stool.  Shallow ulcer 5 cm from anus  Lymphadenopathy:    She has no cervical adenopathy.       Right: No inguinal adenopathy present.  Left: No inguinal adenopathy present.  Neurological: She is alert and oriented to person, place, and time.  Skin: Skin is warm.  Psychiatric: Her behavior is normal.    Data Reviewed Colonoscopy dated 01/01/2016 completed by Verdie Shire, M.D.: Normal.  DIAGNOSIS:  A. COLON; RANDOM COLD BIOPSIES:  - PATCHY MILD INTRAEPITHELIAL LYMPHOCYTOSIS.   Comment:  The crypt architecture is intact and there is no active inflammation. In  some areas there are increased lymphocytes in the surface epithelium,  and a few intraepithelial eosinophils are also noted. No significant  lamina propria infiltrates are seen. There is no subepithelial collagen   deposition. No infectious agents or granulomas are identified. The  histologic features are mild and nonspecific, but raise concern for  lymphocytic colitis in the appropriate clinical setting. The changes  could represent a resolving infection or medication effect. Correlation  is recommended with regard to autoimmune disorders also.  Assessment    Benign anorectal exam. Normal sphincter tone.    Plan    Etiology for stool leakage unclear.  She may benefit from Jackson exercises.    Recommend adding a fiber supplement to her diet.  Follow up phone call in 3 weeks with status update.  PCP:  Lamonte Sakai S This information has been scribed by Gaspar Cola CMA.    Patricia Cross 02/04/2016, 5:27 PM

## 2016-02-03 NOTE — Patient Instructions (Addendum)
The patient is aware to call back for any questions or concerns. Recommend adding a fiber supplement to her diet like Meta wafers Follow up phone call in 3 weeks with status update.

## 2016-02-04 DIAGNOSIS — R159 Full incontinence of feces: Secondary | ICD-10-CM | POA: Insufficient documentation

## 2016-02-17 ENCOUNTER — Other Ambulatory Visit: Payer: Self-pay | Admitting: Internal Medicine

## 2016-02-17 DIAGNOSIS — Z1231 Encounter for screening mammogram for malignant neoplasm of breast: Secondary | ICD-10-CM

## 2016-02-24 ENCOUNTER — Ambulatory Visit
Admission: RE | Admit: 2016-02-24 | Discharge: 2016-02-24 | Disposition: A | Discharge status: Home / Self Care | Payer: Medicare Other | Source: Ambulatory Visit | Attending: Internal Medicine | Admitting: Internal Medicine

## 2016-02-24 DIAGNOSIS — Z1231 Encounter for screening mammogram for malignant neoplasm of breast: Secondary | ICD-10-CM | POA: Diagnosis present

## 2016-03-07 ENCOUNTER — Telehealth: Payer: Self-pay

## 2016-03-07 NOTE — Telephone Encounter (Signed)
-----   Message from Robert Bellow, MD sent at 03/06/2016  9:27 AM EDT ----- Patient was to call with report re: stool leakage w/ addition of fiber supplements.  See how she is doing.

## 2016-03-07 NOTE — Telephone Encounter (Signed)
Patient reports that she is doing great. Has been using the Meta wafers and doing Kagel exercises. She reports no further stool leakage. She "loves" the cookies.

## 2017-02-21 ENCOUNTER — Other Ambulatory Visit: Payer: Self-pay | Admitting: Internal Medicine

## 2017-02-21 DIAGNOSIS — Z1231 Encounter for screening mammogram for malignant neoplasm of breast: Secondary | ICD-10-CM

## 2017-03-16 ENCOUNTER — Ambulatory Visit: Payer: Medicare Other

## 2017-04-03 ENCOUNTER — Ambulatory Visit
Admission: RE | Admit: 2017-04-03 | Discharge: 2017-04-03 | Disposition: A | Discharge status: Home / Self Care | Payer: Medicare Other | Source: Ambulatory Visit | Attending: Internal Medicine | Admitting: Internal Medicine

## 2017-04-03 DIAGNOSIS — Z1231 Encounter for screening mammogram for malignant neoplasm of breast: Secondary | ICD-10-CM | POA: Insufficient documentation

## 2018-02-12 ENCOUNTER — Other Ambulatory Visit: Payer: Self-pay | Admitting: Internal Medicine

## 2018-02-12 DIAGNOSIS — Z1231 Encounter for screening mammogram for malignant neoplasm of breast: Secondary | ICD-10-CM

## 2018-04-05 ENCOUNTER — Ambulatory Visit
Admission: RE | Admit: 2018-04-05 | Discharge: 2018-04-05 | Disposition: A | Discharge status: Home / Self Care | Payer: Medicare Other | Source: Ambulatory Visit | Attending: Internal Medicine | Admitting: Internal Medicine

## 2018-04-05 DIAGNOSIS — Z1231 Encounter for screening mammogram for malignant neoplasm of breast: Secondary | ICD-10-CM | POA: Diagnosis present

## 2018-05-09 ENCOUNTER — Encounter: Payer: Self-pay | Admitting: Anesthesiology

## 2018-05-09 ENCOUNTER — Encounter
Admission: RE | Disposition: A | Discharge status: Home / Self Care | Payer: Self-pay | Source: Ambulatory Visit | Attending: Internal Medicine

## 2018-05-09 ENCOUNTER — Ambulatory Visit: Payer: Medicare Other | Admitting: Anesthesiology

## 2018-05-09 ENCOUNTER — Ambulatory Visit
Admission: RE | Admit: 2018-05-09 | Discharge: 2018-05-09 | Disposition: A | Discharge status: Home / Self Care | Payer: Medicare Other | Source: Ambulatory Visit | Attending: Internal Medicine | Admitting: Internal Medicine

## 2018-05-09 DIAGNOSIS — E119 Type 2 diabetes mellitus without complications: Secondary | ICD-10-CM | POA: Insufficient documentation

## 2018-05-09 DIAGNOSIS — D72820 Lymphocytosis (symptomatic): Secondary | ICD-10-CM | POA: Insufficient documentation

## 2018-05-09 DIAGNOSIS — R194 Change in bowel habit: Secondary | ICD-10-CM | POA: Diagnosis not present

## 2018-05-09 DIAGNOSIS — K5730 Diverticulosis of large intestine without perforation or abscess without bleeding: Secondary | ICD-10-CM | POA: Diagnosis not present

## 2018-05-09 DIAGNOSIS — R197 Diarrhea, unspecified: Secondary | ICD-10-CM | POA: Insufficient documentation

## 2018-05-09 DIAGNOSIS — K222 Esophageal obstruction: Secondary | ICD-10-CM | POA: Diagnosis not present

## 2018-05-09 DIAGNOSIS — K219 Gastro-esophageal reflux disease without esophagitis: Secondary | ICD-10-CM | POA: Insufficient documentation

## 2018-05-09 DIAGNOSIS — I10 Essential (primary) hypertension: Secondary | ICD-10-CM | POA: Insufficient documentation

## 2018-05-09 DIAGNOSIS — Z79899 Other long term (current) drug therapy: Secondary | ICD-10-CM | POA: Insufficient documentation

## 2018-05-09 DIAGNOSIS — K449 Diaphragmatic hernia without obstruction or gangrene: Secondary | ICD-10-CM | POA: Insufficient documentation

## 2018-05-09 DIAGNOSIS — E7800 Pure hypercholesterolemia, unspecified: Secondary | ICD-10-CM | POA: Insufficient documentation

## 2018-05-09 DIAGNOSIS — K921 Melena: Secondary | ICD-10-CM | POA: Insufficient documentation

## 2018-05-09 DIAGNOSIS — Z7984 Long term (current) use of oral hypoglycemic drugs: Secondary | ICD-10-CM | POA: Insufficient documentation

## 2018-05-09 DIAGNOSIS — R1310 Dysphagia, unspecified: Secondary | ICD-10-CM | POA: Diagnosis present

## 2018-05-09 HISTORY — PX: COLONOSCOPY WITH PROPOFOL: SHX5780

## 2018-05-09 HISTORY — PX: ESOPHAGOGASTRODUODENOSCOPY (EGD) WITH PROPOFOL: SHX5813

## 2018-05-09 LAB — GLUCOSE, CAPILLARY: Glucose-Capillary: 92 mg/dL (ref 70–99)

## 2018-05-09 SURGERY — COLONOSCOPY WITH PROPOFOL
Anesthesia: General

## 2018-05-09 MED ORDER — PROPOFOL 10 MG/ML IV BOLUS
INTRAVENOUS | Status: DC | PRN
  Administered 2018-05-09: 60 mg via INTRAVENOUS
  Administered 2018-05-09: 40 mg via INTRAVENOUS

## 2018-05-09 MED ORDER — LIDOCAINE HCL (PF) 1 % IJ SOLN
INTRAMUSCULAR | Status: AC
  Administered 2018-05-09: 0.3 mL
  Filled 2018-05-09: qty 2

## 2018-05-09 MED ORDER — PROPOFOL 10 MG/ML IV BOLUS
INTRAVENOUS | Status: AC
  Filled 2018-05-09: qty 40

## 2018-05-09 MED ORDER — LIDOCAINE HCL (CARDIAC) PF 100 MG/5ML IV SOSY
PREFILLED_SYRINGE | INTRAVENOUS | Status: DC | PRN
  Administered 2018-05-09: 80 mg via INTRAVENOUS

## 2018-05-09 MED ORDER — FENTANYL CITRATE (PF) 100 MCG/2ML IJ SOLN
INTRAMUSCULAR | Status: AC
  Filled 2018-05-09: qty 2

## 2018-05-09 MED ORDER — FENTANYL CITRATE (PF) 100 MCG/2ML IJ SOLN
INTRAMUSCULAR | Status: DC | PRN
  Administered 2018-05-09 (×2): 25 ug via INTRAVENOUS
  Administered 2018-05-09: 50 ug via INTRAVENOUS

## 2018-05-09 MED ORDER — PROPOFOL 500 MG/50ML IV EMUL
INTRAVENOUS | Status: DC | PRN
  Administered 2018-05-09: 125 ug/kg/min via INTRAVENOUS
  Administered 2018-05-09: 60 ug/kg/min via INTRAVENOUS

## 2018-05-09 MED ORDER — SODIUM CHLORIDE 0.9 % IV SOLN
INTRAVENOUS | Status: DC
  Administered 2018-05-09: 1000 mL via INTRAVENOUS

## 2018-05-09 NOTE — Anesthesia Preprocedure Evaluation (Signed)
Anesthesia Evaluation  Patient identified by MRN, date of birth, ID band Patient awake    Reviewed: Allergy & Precautions, NPO status , Patient's Chart, lab work & pertinent test results  History of Anesthesia Complications Negative for: history of anesthetic complications  Airway Mallampati: II       Dental   Pulmonary neg pulmonary ROS,    Pulmonary exam normal        Cardiovascular hypertension, Pt. on medications Normal cardiovascular exam     Neuro/Psych negative neurological ROS     GI/Hepatic negative GI ROS, Neg liver ROS, GERD  Controlled and Medicated,  Endo/Other  diabetes, Oral Hypoglycemic Agents  Renal/GU negative Renal ROS     Musculoskeletal   Abdominal Normal abdominal exam  (+)   Peds  Hematology  (+) anemia ,   Anesthesia Other Findings   Reproductive/Obstetrics                             Anesthesia Physical  Anesthesia Plan  ASA: III  Anesthesia Plan: General   Post-op Pain Management:    Induction: Intravenous  PONV Risk Score and Plan:   Airway Management Planned:   Additional Equipment:   Intra-op Plan:   Post-operative Plan:   Informed Consent: I have reviewed the patients History and Physical, chart, labs and discussed the procedure including the risks, benefits and alternatives for the proposed anesthesia with the patient or authorized representative who has indicated his/her understanding and acceptance.     Plan Discussed with: CRNA and Surgeon  Anesthesia Plan Comments:         Anesthesia Quick Evaluation

## 2018-05-09 NOTE — H&P (Signed)
  Outpatient short stay form Pre-procedure 05/09/2018 8:52 AM Patricia Cross K. Alice Reichert, M.D.  Primary Physician: Lamonte Sakai, M.D.  Reason for visit:  Dysphagia, melena, change in bowel habits  History of present illness:  72 y/o female patient presents for one year of intermittent esophageal dysphagia to solids. No known hx of GERD sx's. Also with intermittent melenic stools which have ceased over the past month. Has hx of diarrhea and some seepage consistent with personal hx of lymphocytic colitis dx in 2017 by Dr. Verdie Shire.    Current Facility-Administered Medications:  .  0.9 %  sodium chloride infusion, , Intravenous, Continuous, [place], Benay Pike, MD, Last Rate: 20 mL/hr at 05/09/18 0813  Medications Prior to Admission  Medication Sig Dispense Refill Last Dose  . amLODipine (NORVASC) 5 MG tablet Take 5 mg by mouth daily.    05/08/2018 at Unknown time  . glipiZIDE (GLUCOTROL) 10 MG tablet Take 10 mg by mouth 2 (two) times daily before a meal.   05/08/2018 at Unknown time  . losartan-hydrochlorothiazide (HYZAAR) 100-25 MG per tablet Take 1 tablet by mouth daily.    05/08/2018 at Unknown time  . metFORMIN (GLUCOPHAGE) 1000 MG tablet Take 1,000 mg by mouth 2 (two) times daily with a meal.    05/08/2018 at Unknown time  . ONGLYZA 5 MG TABS tablet 5 mg daily.    Past Week at Unknown time  . simvastatin (ZOCOR) 80 MG tablet Take 80 mg by mouth daily at 6 PM.    05/08/2018 at Unknown time  . [place]    05/08/2018 at Unknown time  . pantoprazole (PROTONIX) 40 MG tablet Take 40 mg by mouth daily.    Not Taking at Unknown time     No Known Allergies   Past Medical History:  Diagnosis Date  . Anemia   . Diabetes mellitus without complication ([place]) 0962  . High cholesterol   . Hypertension     Review of systems:  Otherwise negative.    Physical Exam  Gen: Alert, oriented. Appears stated age.  HEENT: [place]/AT. PERRLA. Lungs: CTA, no wheezes. CV: RR nl S1, S2. Abd: soft, benign,  no masses. BS+ Ext: No edema. Pulses 2+    Planned procedures: Proceed with EGD and colonoscopy. The patient understands the nature of the planned procedure, indications, risks, alternatives and potential complications including but not limited to bleeding, infection, perforation, damage to internal organs and possible oversedation/side effects from anesthesia. The patient agrees and gives consent to proceed.  Please refer to procedure notes for findings, recommendations and patient disposition/instructions.     Chioke Noxon K. Alice Reichert, M.D. Gastroenterology 05/09/2018  8:52 AM

## 2018-05-09 NOTE — Transfer of Care (Signed)
Immediate Anesthesia Transfer of Care Note  Patient: Patricia Cross  Procedure(s) Performed: COLONOSCOPY WITH PROPOFOL (N/A ) ESOPHAGOGASTRODUODENOSCOPY (EGD) WITH PROPOFOL (N/A )  Patient Location: PACU  Anesthesia Type:General  Level of Consciousness: sedated  Airway & Oxygen Therapy: Patient Spontanous Breathing and Patient connected to nasal cannula oxygen  Post-op Assessment: Report given to RN and Post -op Vital signs reviewed and stable  Post vital signs: Reviewed and stable  Last Vitals:  Vitals Value Taken Time  BP    Temp    Pulse    Resp    SpO2      Last Pain:  Vitals:   05/09/18 0756  TempSrc: Tympanic  PainSc: 0-No pain         Complications: No apparent anesthesia complications

## 2018-05-09 NOTE — Anesthesia Post-op Follow-up Note (Signed)
Anesthesia QCDR form completed.        

## 2018-05-09 NOTE — Op Note (Signed)
[facility] Gastroenterology Patient Name: Patricia Cross Procedure Date: 05/09/2018 8:59 AM MRN: [id number] Account #: [id number] Date of Birth: 16-Jul-1946 Admit Type: Outpatient Age: 72 Room: [facility] ENDO ROOM 4 Gender: Female Note Status: Finalized Procedure:            Colonoscopy Indications:          Melena, Diarrhea (secondary to noninfectious colitis) Providers:            Benay Pike. Alice Reichert MD, MD Referring MD:         Perrin Maltese, MD (Referring MD) Medicines:            Propofol per Anesthesia Complications:        No immediate complications. Procedure:            Pre-Anesthesia Assessment:                       - The risks and benefits of the procedure and the                        sedation options and risks were discussed with the                        patient. All questions were answered and informed                        consent was obtained.                       - Patient identification and proposed procedure were                        verified prior to the procedure by the nurse. The                        procedure was verified in the procedure room.                       - ASA Grade Assessment: III - A patient with severe                        systemic disease.                       - After reviewing the risks and benefits, the patient                        was deemed in satisfactory condition to undergo the                        procedure.                       After obtaining informed consent, the colonoscope was                        passed under direct vision. Throughout the procedure,                        the patient's blood pressure, pulse, and oxygen  saturations were monitored continuously. The                        Colonoscope was introduced through the anus and                        advanced to the the cecum, identified by appendiceal                        orifice and ileocecal valve. The  colonoscopy was                        performed without difficulty. The patient tolerated the                        procedure well. The quality of the bowel preparation                        was good. The ileocecal valve, appendiceal orifice, and                        rectum were photographed. Findings:      The perianal and digital rectal examinations were normal. Pertinent       negatives include normal sphincter tone and no palpable rectal lesions.      A few small-mouthed diverticula were found in the left colon and right       colon.      The exam was otherwise without abnormality.      Biopsies for histology were taken with a cold forceps from the right       colon and left colon for evaluation of microscopic colitis.      The exam was otherwise without abnormality on direct and retroflexion       views. Impression:           - Diverticulosis in the left colon and in the right                        colon.                       - The examination was otherwise normal.                       - The examination was otherwise normal on direct and                        retroflexion views.                       - Biopsies were taken with a cold forceps from the                        right colon and left colon for evaluation of                        microscopic colitis. Recommendation:       - Patient has a contact number available for                        emergencies. The signs and symptoms of potential  delayed complications were discussed with the patient.                        Return to normal activities tomorrow. Written discharge                        instructions were provided to the patient.                       - Resume previous diet.                       - Continue present medications.                       - No repeat colonoscopy due to age.                       - The findings and recommendations were discussed with                         the patient and their family.                       - Return to physician assistant in 3 months. Procedure Code(s):    --- Professional ---                       [phone], Colonoscopy, flexible; with biopsy, single or                        multiple Diagnosis Code(s):    --- Professional ---                       K92.1, Melena (includes Hematochezia)                       K52.9, Noninfective gastroenteritis and colitis,                        unspecified                       K57.30, Diverticulosis of large intestine without                        perforation or abscess without bleeding CPT copyright 2017 American Medical Association. All rights reserved. The codes documented in this report are preliminary and upon coder review may  be revised to meet current compliance requirements. Efrain Sella MD, MD 05/09/2018 9:23:38 AM This report has been signed electronically. Number of Addenda: 0 Note Initiated On: 05/09/2018 8:59 AM Scope Withdrawal Time: 0 hours 6 minutes 2 seconds  Total Procedure Duration: 0 hours 9 minutes 58 seconds       [facility]

## 2018-05-09 NOTE — Interval H&P Note (Signed)
History and Physical Interval Note:  05/09/2018 8:55 AM  Patricia Cross  has presented today for surgery, with the diagnosis of melena; lymphocytic colitis;loose stools; dysphagia  The various methods of treatment have been discussed with the patient and family. After consideration of risks, benefits and other options for treatment, the patient has consented to  Procedure(s): COLONOSCOPY WITH PROPOFOL (N/A) ESOPHAGOGASTRODUODENOSCOPY (EGD) WITH PROPOFOL (N/A) as a surgical intervention .  The patient's history has been reviewed, patient examined, no change in status, stable for surgery.  I have reviewed the patient's chart and labs.  Questions were answered to the patient's satisfaction.     [place], [place]

## 2018-05-09 NOTE — Op Note (Signed)
[facility] Gastroenterology Patient Name: Patricia Cross Procedure Date: 05/09/2018 9:00 AM MRN: [id number] Account #: [id number] Date of Birth: 1946/08/11 Admit Type: Outpatient Age: 72 Room: [facility] ENDO ROOM 4 Gender: Female Note Status: Finalized Procedure:            Upper GI endoscopy Indications:          Dysphagia Providers:            Benay Pike. Alice Reichert MD, MD Referring MD:         Perrin Maltese, MD (Referring MD) Medicines:            Propofol per Anesthesia Complications:        No immediate complications. Procedure:            Pre-Anesthesia Assessment:                       - The risks and benefits of the procedure and the                        sedation options and risks were discussed with the                        patient. All questions were answered and informed                        consent was obtained.                       - Patient identification and proposed procedure were                        verified prior to the procedure by the nurse. The                        procedure was verified in the procedure room.                       - ASA Grade Assessment: III - A patient with severe                        systemic disease.                       - After reviewing the risks and benefits, the patient                        was deemed in satisfactory condition to undergo the                        procedure.                       After obtaining informed consent, the endoscope was                        passed under direct vision. Throughout the procedure,                        the patient's blood pressure, pulse, and oxygen  saturations were monitored continuously. The Endoscope                        was introduced through the mouth, and advanced to the                        third part of duodenum. The upper GI endoscopy was                        accomplished without difficulty. The patient tolerated             the procedure well. Findings:      One benign-appearing, intrinsic mild stenosis was found in the distal       esophagus. This stenosis measured less than one cm (in length). The       stenosis was traversed. The scope was withdrawn. Dilation was performed       with a Maloney dilator with no resistance at 61 Fr.      Patchy mildly erythematous mucosa without bleeding was found in the       gastric antrum.      A 1 cm hiatal hernia was present.      The examined duodenum was normal. Impression:           - Benign-appearing esophageal stenosis. Dilated.                       - Erythematous mucosa in the antrum.                       - 1 cm hiatal hernia.                       - Normal examined duodenum.                       - No specimens collected. Recommendation:       - Monitor results to esophageal dilation                       - Proceed with colonoscopy Procedure Code(s):    --- Professional ---                       [phone], Esophagogastroduodenoscopy, flexible, transoral;                        diagnostic, including collection of specimen(s) by                        brushing or washing, when performed (separate procedure)                       43450, Dilation of esophagus, by unguided sound or                        bougie, single or multiple passes Diagnosis Code(s):    --- Professional ---                       R13.10, Dysphagia, unspecified                       K44.9, Diaphragmatic hernia without obstruction or  gangrene                       K31.89, Other diseases of stomach and duodenum                       K22.2, Esophageal obstruction CPT copyright 2017 American Medical Association. All rights reserved. The codes documented in this report are preliminary and upon coder review may  be revised to meet current compliance requirements. Efrain Sella MD, MD 05/09/2018 9:06:44 AM This report has been signed electronically. Number of  Addenda: 0 Note Initiated On: 05/09/2018 9:00 AM      [facility]

## 2018-05-10 ENCOUNTER — Encounter: Payer: Self-pay | Admitting: Internal Medicine

## 2018-05-10 LAB — SURGICAL PATHOLOGY

## 2018-05-10 NOTE — Anesthesia Postprocedure Evaluation (Signed)
Anesthesia Post Note  Patient: Patricia Cross  Procedure(s) Performed: COLONOSCOPY WITH PROPOFOL (N/A ) ESOPHAGOGASTRODUODENOSCOPY (EGD) WITH PROPOFOL (N/A )  Patient location during evaluation: PACU Anesthesia Type: General Level of consciousness: awake and alert and oriented Pain management: pain level controlled Vital Signs Assessment: post-procedure vital signs reviewed and stable Respiratory status: spontaneous breathing Cardiovascular status: blood pressure returned to baseline Anesthetic complications: no     Last Vitals:  Vitals:   05/09/18 0940 05/09/18 0950  BP: (!) 153/79 (!) 175/73  Pulse: 79 64  Resp: 15 15  Temp:    SpO2: 100% 100%    Last Pain:  Vitals:   05/09/18 0920  TempSrc: Tympanic  PainSc:                  Lyda Colcord

## 2019-05-27 ENCOUNTER — Other Ambulatory Visit: Payer: Self-pay | Admitting: Internal Medicine

## 2019-05-27 DIAGNOSIS — Z1231 Encounter for screening mammogram for malignant neoplasm of breast: Secondary | ICD-10-CM

## 2019-07-01 ENCOUNTER — Ambulatory Visit: Payer: Medicare Other

## 2019-08-19 ENCOUNTER — Ambulatory Visit
Admission: RE | Admit: 2019-08-19 | Discharge: 2019-08-19 | Disposition: A | Discharge status: Home / Self Care | Payer: Medicare Other | Source: Ambulatory Visit | Attending: Internal Medicine | Admitting: Internal Medicine

## 2019-08-19 DIAGNOSIS — Z1231 Encounter for screening mammogram for malignant neoplasm of breast: Secondary | ICD-10-CM | POA: Insufficient documentation

## 2020-08-24 ENCOUNTER — Other Ambulatory Visit: Payer: Self-pay | Admitting: Internal Medicine

## 2020-08-24 DIAGNOSIS — Z1231 Encounter for screening mammogram for malignant neoplasm of breast: Secondary | ICD-10-CM

## 2020-08-27 ENCOUNTER — Other Ambulatory Visit: Payer: Self-pay

## 2020-08-27 ENCOUNTER — Ambulatory Visit
Admission: RE | Admit: 2020-08-27 | Discharge: 2020-08-27 | Disposition: A | Discharge status: Home / Self Care | Payer: Medicare Other | Source: Ambulatory Visit | Attending: Internal Medicine | Admitting: Internal Medicine

## 2020-08-27 DIAGNOSIS — Z1231 Encounter for screening mammogram for malignant neoplasm of breast: Secondary | ICD-10-CM | POA: Diagnosis present

## 2021-08-20 ENCOUNTER — Other Ambulatory Visit: Payer: Self-pay | Admitting: Internal Medicine

## 2021-08-20 DIAGNOSIS — Z1231 Encounter for screening mammogram for malignant neoplasm of breast: Secondary | ICD-10-CM

## 2021-09-14 ENCOUNTER — Other Ambulatory Visit: Payer: Self-pay

## 2021-09-14 ENCOUNTER — Ambulatory Visit
Admission: RE | Admit: 2021-09-14 | Discharge: 2021-09-14 | Disposition: A | Discharge status: Home / Self Care | Payer: Medicare Other | Source: Ambulatory Visit | Attending: Internal Medicine | Admitting: Internal Medicine

## 2021-09-14 DIAGNOSIS — Z1231 Encounter for screening mammogram for malignant neoplasm of breast: Secondary | ICD-10-CM | POA: Insufficient documentation

## 2021-10-28 ENCOUNTER — Other Ambulatory Visit: Payer: Self-pay

## 2021-10-28 ENCOUNTER — Emergency Department: Payer: Medicare Other

## 2021-10-28 ENCOUNTER — Other Ambulatory Visit: Payer: Self-pay | Admitting: Radiology

## 2021-10-28 ENCOUNTER — Inpatient Hospital Stay
Admission: EM | Admit: 2021-10-28 | Discharge: 2021-11-03 | DRG: 371 | Disposition: A | Discharge status: Home / Self Care | Payer: Medicare Other | Attending: Internal Medicine | Admitting: Internal Medicine

## 2021-10-28 ENCOUNTER — Other Ambulatory Visit: Payer: Medicare Other

## 2021-10-28 DIAGNOSIS — N170 Acute kidney failure with tubular necrosis: Secondary | ICD-10-CM | POA: Diagnosis present

## 2021-10-28 DIAGNOSIS — Z90722 Acquired absence of ovaries, bilateral: Secondary | ICD-10-CM

## 2021-10-28 DIAGNOSIS — Z79899 Other long term (current) drug therapy: Secondary | ICD-10-CM | POA: Diagnosis not present

## 2021-10-28 DIAGNOSIS — K222 Esophageal obstruction: Secondary | ICD-10-CM | POA: Diagnosis present

## 2021-10-28 DIAGNOSIS — E860 Dehydration: Secondary | ICD-10-CM | POA: Diagnosis present

## 2021-10-28 DIAGNOSIS — A0472 Enterocolitis due to Clostridium difficile, not specified as recurrent: Principal | ICD-10-CM

## 2021-10-28 DIAGNOSIS — K219 Gastro-esophageal reflux disease without esophagitis: Secondary | ICD-10-CM | POA: Diagnosis present

## 2021-10-28 DIAGNOSIS — N179 Acute kidney failure, unspecified: Secondary | ICD-10-CM | POA: Diagnosis present

## 2021-10-28 DIAGNOSIS — K921 Melena: Secondary | ICD-10-CM | POA: Diagnosis present

## 2021-10-28 DIAGNOSIS — Z7984 Long term (current) use of oral hypoglycemic drugs: Secondary | ICD-10-CM | POA: Diagnosis not present

## 2021-10-28 DIAGNOSIS — E875 Hyperkalemia: Secondary | ICD-10-CM | POA: Diagnosis present

## 2021-10-28 DIAGNOSIS — D649 Anemia, unspecified: Secondary | ICD-10-CM

## 2021-10-28 DIAGNOSIS — E1165 Type 2 diabetes mellitus with hyperglycemia: Secondary | ICD-10-CM

## 2021-10-28 DIAGNOSIS — E1129 Type 2 diabetes mellitus with other diabetic kidney complication: Secondary | ICD-10-CM | POA: Diagnosis present

## 2021-10-28 DIAGNOSIS — R197 Diarrhea, unspecified: Secondary | ICD-10-CM

## 2021-10-28 DIAGNOSIS — Z20822 Contact with and (suspected) exposure to covid-19: Secondary | ICD-10-CM | POA: Diagnosis present

## 2021-10-28 DIAGNOSIS — N201 Calculus of ureter: Secondary | ICD-10-CM | POA: Diagnosis present

## 2021-10-28 DIAGNOSIS — E876 Hypokalemia: Secondary | ICD-10-CM | POA: Diagnosis present

## 2021-10-28 DIAGNOSIS — D72829 Elevated white blood cell count, unspecified: Secondary | ICD-10-CM | POA: Diagnosis present

## 2021-10-28 DIAGNOSIS — E872 Acidosis, unspecified: Secondary | ICD-10-CM | POA: Diagnosis present

## 2021-10-28 DIAGNOSIS — E1122 Type 2 diabetes mellitus with diabetic chronic kidney disease: Secondary | ICD-10-CM | POA: Diagnosis not present

## 2021-10-28 DIAGNOSIS — K922 Gastrointestinal hemorrhage, unspecified: Secondary | ICD-10-CM | POA: Diagnosis present

## 2021-10-28 DIAGNOSIS — E785 Hyperlipidemia, unspecified: Secondary | ICD-10-CM | POA: Diagnosis present

## 2021-10-28 DIAGNOSIS — E7800 Pure hypercholesterolemia, unspecified: Secondary | ICD-10-CM | POA: Diagnosis present

## 2021-10-28 DIAGNOSIS — I10 Essential (primary) hypertension: Secondary | ICD-10-CM | POA: Diagnosis present

## 2021-10-28 DIAGNOSIS — R112 Nausea with vomiting, unspecified: Secondary | ICD-10-CM | POA: Diagnosis not present

## 2021-10-28 DIAGNOSIS — D638 Anemia in other chronic diseases classified elsewhere: Secondary | ICD-10-CM | POA: Diagnosis present

## 2021-10-28 DIAGNOSIS — Z90710 Acquired absence of both cervix and uterus: Secondary | ICD-10-CM

## 2021-10-28 DIAGNOSIS — N1832 Chronic kidney disease, stage 3b: Secondary | ICD-10-CM | POA: Diagnosis not present

## 2021-10-28 LAB — COMPREHENSIVE METABOLIC PANEL
ALT: 24 U/L (ref 0–44)
AST: 44 U/L — ABNORMAL HIGH (ref 15–41)
Albumin: 3.7 g/dL (ref 3.5–5.0)
Alkaline Phosphatase: 53 U/L (ref 38–126)
BUN: 99 mg/dL — ABNORMAL HIGH (ref 8–23)
CO2: <7 mmol/L — ABNORMAL LOW (ref 22–32)
Calcium: 8.8 mg/dL — ABNORMAL LOW (ref 8.9–10.3)
Chloride: 94 mmol/L — ABNORMAL LOW (ref 98–111)
Creatinine, Ser: 6.93 mg/dL — ABNORMAL HIGH (ref 0.44–1.00)
GFR, Estimated: 6 mL/min — ABNORMAL LOW (ref >60)
Glucose, Bld: 271 mg/dL — ABNORMAL HIGH (ref 70–99)
Potassium: 5.3 mmol/L — ABNORMAL HIGH (ref 3.5–5.1)
Sodium: 137 mmol/L (ref 135–145)
Total Bilirubin: 0.8 mg/dL (ref 0.3–1.2)
Total Protein: 7 g/dL (ref 6.5–8.1)

## 2021-10-28 LAB — CBC
HCT: 31 % — ABNORMAL LOW (ref 36.0–46.0)
HCT: 28 % — ABNORMAL LOW (ref 36.0–46.0)
HCT: 26.9 % — ABNORMAL LOW (ref 36.0–46.0)
Hemoglobin: 10.1 g/dL — ABNORMAL LOW (ref 12.0–15.0)
Hemoglobin: 9.3 g/dL — ABNORMAL LOW (ref 12.0–15.0)
Hemoglobin: 9.3 g/dL — ABNORMAL LOW (ref 12.0–15.0)
MCH: 30.1 pg (ref 26.0–34.0)
MCH: 30.2 pg (ref 26.0–34.0)
MCH: 30 pg (ref 26.0–34.0)
MCHC: 32.6 g/dL (ref 30.0–36.0)
MCHC: 33.2 g/dL (ref 30.0–36.0)
MCHC: 34.6 g/dL (ref 30.0–36.0)
MCV: 92.3 fL (ref 80.0–100.0)
MCV: 90.9 fL (ref 80.0–100.0)
MCV: 86.8 fL (ref 80.0–100.0)
Platelets: 300 10*3/uL (ref 150–400)
Platelets: 240 10*3/uL (ref 150–400)
Platelets: 245 10*3/uL (ref 150–400)
RBC: 3.36 MIL/uL — ABNORMAL LOW (ref 3.87–5.11)
RBC: 3.08 MIL/uL — ABNORMAL LOW (ref 3.87–5.11)
RBC: 3.1 MIL/uL — ABNORMAL LOW (ref 3.87–5.11)
RDW: 19.1 % — ABNORMAL HIGH (ref 11.5–15.5)
RDW: 18.9 % — ABNORMAL HIGH (ref 11.5–15.5)
RDW: 18.6 % — ABNORMAL HIGH (ref 11.5–15.5)
WBC: 14.4 10*3/uL — ABNORMAL HIGH (ref 4.0–10.5)
WBC: 13.1 10*3/uL — ABNORMAL HIGH (ref 4.0–10.5)
WBC: 13.3 10*3/uL — ABNORMAL HIGH (ref 4.0–10.5)
nRBC: 0 % (ref 0.0–0.2)
nRBC: 0 % (ref 0.0–0.2)
nRBC: 0 % (ref 0.0–0.2)

## 2021-10-28 LAB — RESP PANEL BY RT-PCR (FLU A&B, COVID) ARPGX2
Influenza A by PCR: NEGATIVE
Influenza B by PCR: NEGATIVE
SARS Coronavirus 2 by RT PCR: NEGATIVE

## 2021-10-28 LAB — TYPE AND SCREEN
ABO/RH(D): O POS
Antibody Screen: NEGATIVE

## 2021-10-28 LAB — PROTIME-INR
INR: 1.4 — ABNORMAL HIGH (ref 0.8–1.2)
Prothrombin Time: 16.8 seconds — ABNORMAL HIGH (ref 11.4–15.2)

## 2021-10-28 LAB — APTT: aPTT: 33 seconds (ref 24–36)

## 2021-10-28 LAB — GLUCOSE, CAPILLARY
Glucose-Capillary: 268 mg/dL — ABNORMAL HIGH (ref 70–99)
Glucose-Capillary: 301 mg/dL — ABNORMAL HIGH (ref 70–99)

## 2021-10-28 LAB — CBG MONITORING, ED: Glucose-Capillary: 287 mg/dL — ABNORMAL HIGH (ref 70–99)

## 2021-10-28 LAB — POTASSIUM: Potassium: 4.6 mmol/L (ref 3.5–5.1)

## 2021-10-28 MED ORDER — ROSUVASTATIN CALCIUM 20 MG PO TABS
40.0000 mg | ORAL_TABLET | Freq: Every evening | ORAL | Status: DC
  Filled 2021-10-28: qty 2

## 2021-10-28 MED ORDER — ONDANSETRON HCL 4 MG/2ML IJ SOLN: 4.0000 mg | Freq: Three times a day (TID) | INTRAMUSCULAR | Status: DC | PRN

## 2021-10-28 MED ORDER — AMLODIPINE BESYLATE 10 MG PO TABS
10.0000 mg | ORAL_TABLET | Freq: Every day | ORAL | Status: DC
  Administered 2021-10-28 – 2021-10-29 (×2): 10 mg via ORAL
  Filled 2021-10-28: qty 1
  Filled 2021-10-28: qty 2

## 2021-10-28 MED ORDER — SODIUM BICARBONATE 8.4 % IV SOLN
50.0000 meq | Freq: Once | INTRAVENOUS | Status: AC
Start: 2021-10-28 — End: 2021-10-28
  Administered 2021-10-28: 50 meq via INTRAVENOUS
  Filled 2021-10-28: qty 50

## 2021-10-28 MED ORDER — SODIUM CHLORIDE 0.9 % IV BOLUS
500.0000 mL | Freq: Once | INTRAVENOUS | Status: AC
  Administered 2021-10-28: 500 mL via INTRAVENOUS

## 2021-10-28 MED ORDER — SODIUM BICARBONATE 8.4 % IV SOLN
INTRAVENOUS | Status: DC
  Filled 2021-10-28 (×2): qty 1000
  Filled 2021-10-28: qty 150
  Filled 2021-10-28: qty 1000
  Filled 2021-10-28: qty 150

## 2021-10-28 MED ORDER — ACETAMINOPHEN 325 MG PO TABS: 650.0000 mg | ORAL_TABLET | Freq: Four times a day (QID) | ORAL | Status: DC | PRN

## 2021-10-28 MED ORDER — HYDRALAZINE HCL 20 MG/ML IJ SOLN: 5.0000 mg | INTRAMUSCULAR | Status: DC | PRN

## 2021-10-28 MED ORDER — INSULIN ASPART 100 UNIT/ML IJ SOLN
0.0000 [IU] | Freq: Every day | INTRAMUSCULAR | Status: DC
  Administered 2021-10-28: 3 [IU] via SUBCUTANEOUS
  Administered 2021-10-30: 2 [IU] via SUBCUTANEOUS
  Filled 2021-10-28 (×4): qty 1

## 2021-10-28 MED ORDER — ADULT MULTIVITAMIN W/MINERALS CH
1.0000 | ORAL_TABLET | Freq: Every day | ORAL | Status: DC
  Administered 2021-10-28 – 2021-11-03 (×7): 1 via ORAL
  Filled 2021-10-28 (×7): qty 1

## 2021-10-28 MED ORDER — PANTOPRAZOLE SODIUM 40 MG IV SOLR
40.0000 mg | Freq: Two times a day (BID) | INTRAVENOUS | Status: DC
  Administered 2021-10-28 – 2021-11-03 (×12): 40 mg via INTRAVENOUS
  Filled 2021-10-28 (×12): qty 40

## 2021-10-28 MED ORDER — INSULIN ASPART 100 UNIT/ML IJ SOLN
0.0000 [IU] | Freq: Three times a day (TID) | INTRAMUSCULAR | Status: DC
  Administered 2021-10-28: 5 [IU] via SUBCUTANEOUS
  Administered 2021-10-29 (×2): 3 [IU] via SUBCUTANEOUS
  Administered 2021-10-29: 2 [IU] via SUBCUTANEOUS
  Administered 2021-10-30: 9 [IU] via SUBCUTANEOUS
  Administered 2021-10-30: 5 [IU] via SUBCUTANEOUS
  Administered 2021-10-30: 1 [IU] via SUBCUTANEOUS
  Administered 2021-10-31: 7 [IU] via SUBCUTANEOUS
  Administered 2021-10-31: 2 [IU] via SUBCUTANEOUS
  Administered 2021-10-31: 3 [IU] via SUBCUTANEOUS
  Administered 2021-11-01: 7 [IU] via SUBCUTANEOUS
  Administered 2021-11-01: 3 [IU] via SUBCUTANEOUS
  Administered 2021-11-01: 2 [IU] via SUBCUTANEOUS
  Administered 2021-11-02: 7 [IU] via SUBCUTANEOUS
  Administered 2021-11-02: 5 [IU] via SUBCUTANEOUS
  Administered 2021-11-02 – 2021-11-03 (×2): 2 [IU] via SUBCUTANEOUS
  Administered 2021-11-03: 9 [IU] via SUBCUTANEOUS
  Filled 2021-10-28 (×18): qty 1

## 2021-10-28 MED ORDER — PANTOPRAZOLE SODIUM 40 MG IV SOLR
40.0000 mg | Freq: Once | INTRAVENOUS | Status: AC
Start: 2021-10-28 — End: 2021-10-28
  Administered 2021-10-28: 40 mg via INTRAVENOUS
  Filled 2021-10-28: qty 40

## 2021-10-28 MED ORDER — SODIUM CHLORIDE 0.9 % IV SOLN
INTRAVENOUS | Status: DC
  Administered 2021-10-28: 1000 mL via INTRAVENOUS

## 2021-10-28 NOTE — ED Provider Notes (Signed)
[facility] Provider Note    Event Date/Time   First MD Initiated Contact with Patient 10/28/21 0920     (approximate)   History   Weakness   HPI  Patricia Cross is a 76 y.o. female with a history of lymphocytic colitis presents to the ER for generalized malaise over the past few days last night started having nausea vomiting has reportedly had over 10 episodes of dark tarry stool.  Does have some mild abdominal pain.  Denies any history of kidney disease does have a history of high blood pressure.  Apparently has not been tolerating much p.o. over the past few days to weeks.     Physical Exam   Triage Vital Signs: ED Triage Vitals  Enc Vitals Group     BP 10/28/21 0920 (!) 124/41     Pulse Rate 10/28/21 0920 96     Resp 10/28/21 0920 17     Temp 10/28/21 0920 (!) 97.4 F (36.3 C)     Temp Source 10/28/21 0920 Oral     SpO2 10/28/21 0920 100 %     Weight 10/28/21 0914 118 lb (53.5 kg)     Height 10/28/21 0914 5\' 3"  (1.6 m)     Head Circumference --      Peak Flow --      Pain Score 10/28/21 0914 0     Pain Loc --      Pain Edu? --      Excl. in [place]? --     Most recent vital signs: Vitals:   10/28/21 0920 10/28/21 1130  BP: (!) 124/41 (!) 114/50  Pulse: 96 93  Resp: 17 17  Temp: (!) 97.4 F (36.3 C)   SpO2: 100% 100%     Constitutional: Alert  Eyes: Conjunctivae are normal.  Head: Atraumatic. Nose: No congestion/rhinnorhea. Mouth/Throat: Mucous membranes are moist.   Neck: Painless ROM.  Cardiovascular:   Good peripheral circulation. Respiratory: Normal respiratory effort.  No retractions.  Gastrointestinal: Soft and nontender. Stool is guaiac positive Musculoskeletal:  no deformity Neurologic:  MAE spontaneously. No gross focal neurologic deficits are appreciated.  Skin:  Skin is warm, dry and intact. No rash noted. Psychiatric: Mood and affect are normal. Speech and behavior are normal.    ED Results / Procedures /  Treatments   Labs (all labs ordered are listed, but only abnormal results are displayed) Labs Reviewed  CBC - Abnormal; Notable for the following components:      Result Value   WBC 14.4 (*)    RBC 3.36 (*)    Hemoglobin 10.1 (*)    HCT 31.0 (*)    RDW 19.1 (*)    All other components within normal limits  COMPREHENSIVE METABOLIC PANEL - Abnormal; Notable for the following components:   Potassium 5.3 (*)    Chloride 94 (*)    CO2 <7 (*)    Glucose, Bld 271 (*)    BUN 99 (*)    Creatinine, Ser 6.93 (*)    Calcium 8.8 (*)    AST 44 (*)    GFR, Estimated 6 (*)    All other components within normal limits  PROTIME-INR - Abnormal; Notable for the following components:   Prothrombin Time 16.8 (*)    INR 1.4 (*)    All other components within normal limits  CBC - Abnormal; Notable for the following components:   WBC 13.1 (*)    RBC 3.08 (*)    Hemoglobin  9.3 (*)    HCT 28.0 (*)    RDW 18.9 (*)    All other components within normal limits  RESP PANEL BY RT-PCR (FLU A&B, COVID) ARPGX2  APTT  CBC  CBC  URINALYSIS, COMPLETE (UACMP) WITH MICROSCOPIC  TYPE AND SCREEN     EKG  ED ECG REPORT I, Merlyn Lot, the attending physician, personally viewed and interpreted this ECG.   Date: 10/28/2021  EKG Time: 9:21  Rate: 90  Rhythm: sinus  Axis: normal  Intervals: normal qt  ST&T Change: nonspecific st abn    RADIOLOGY Please see ED Course for my review and interpretation.  I personally reviewed all radiographic images ordered to evaluate for the above acute complaints and reviewed radiology reports and findings.  These findings were personally discussed with the patient.  Please see medical record for radiology report.     PROCEDURES:  Critical Care performed: Yes, see critical care procedure note(s)  .Critical Care Performed by: Merlyn Lot, MD Authorized by: Merlyn Lot, MD   Critical care provider statement:    Critical care time (minutes):   35   Critical care was necessary to treat or prevent imminent or life-threatening deterioration of the following conditions:  Renal failure   Critical care was time spent personally by me on the following activities:  Ordering and performing treatments and interventions, ordering and review of laboratory studies, ordering and review of radiographic studies, pulse oximetry, re-evaluation of patient's condition, review of old charts, obtaining history from patient or surrogate, examination of patient, evaluation of patient's response to treatment, discussions with primary provider, discussions with consultants and development of treatment plan with patient or surrogate   MEDICATIONS ORDERED IN ED: Medications  0.9 %  sodium chloride infusion (has no administration in time range)  pantoprazole (PROTONIX) injection 40 mg (has no administration in time range)  ondansetron (ZOFRAN) injection 4 mg (has no administration in time range)  acetaminophen (TYLENOL) tablet 650 mg (has no administration in time range)  hydrALAZINE (APRESOLINE) injection 5 mg (has no administration in time range)  insulin aspart (novoLOG) injection 0-9 Units (has no administration in time range)  insulin aspart (novoLOG) injection 0-5 Units (has no administration in time range)  sodium chloride 0.9 % bolus 500 mL (0 mLs Intravenous Stopped 10/28/21 1235)  pantoprazole (PROTONIX) injection 40 mg (40 mg Intravenous Given 10/28/21 1148)  sodium bicarbonate injection 50 mEq (50 mEq Intravenous Given 10/28/21 1231)  sodium chloride 0.9 % bolus 500 mL (500 mLs Intravenous New Bag/Given 10/28/21 1225)     IMPRESSION / MDM / [place] / ED COURSE  I reviewed the triage vital signs and the nursing notes.                              Differential diagnosis includes, but is not limited to, enteritis, colitis, SBO, diverticulitis, electrolyte abnormality, anemia, UGI B, renal failure   Patient presenting with multiple  episodes of nausea vomiting dark-colored diarrhea.  Hemodynamically stable upon arrival some mild abdominal pain for which CT imaging will be ordered.  Will give IV fluids.  Blood work ordered for above differential  Clinical Course as of 10/28/21 1242  Thu Oct 28, 2021  1034 Review of her blood work shows that she is in acute renal failure BUN 99 creatinine 6.93 mild elevation potassium but undetectable bicarb. [PR]  0623 CT scan on my review does not show any evidence of small bowel  obstruction will await formal read [PR]  1142 No sign of obstruction.  Does have weakly positive guaiac rectal exam no melena or bleeding at this time. currently she is stable and appropriate for admission to the hospital for further medical work-up.  Hospitalist has been consulted and has accepted patient to their service. [PR]    Clinical Course User Index [PR] Merlyn Lot, MD     FINAL CLINICAL IMPRESSION(S) / ED DIAGNOSES   Final diagnoses:  Acute renal failure, unspecified acute renal failure type [facility])  Gastrointestinal hemorrhage, unspecified gastrointestinal hemorrhage type     Rx / DC Orders   ED Discharge Orders     None        Note:  This document was prepared using Dragon voice recognition software and may include unintentional dictation errors.    Merlyn Lot, MD 10/28/21 1243

## 2021-10-28 NOTE — ED Notes (Signed)
+   occult card

## 2021-10-28 NOTE — Consult Note (Signed)
[facility]  CONSULT NOTE    Date: 10/28/2021                  Patient Name:  Patricia Cross  MRN: [id number]  DOB: 27-Jan-1946  Age / Sex: 76 y.o., female         PCP: Perrin Maltese, MD                 Service Requesting Consult: [facility]                 Reason for Consult: Acute kidney injury            History of Present Illness: Patricia Cross is a 76 y.o.  female with past medical history of diabetes, GERD, anemia, hypertension, lymphocytic colitis, and hyperlipidemia, who was admitted to [facility] on 10/28/2021 for GI bleeding [K92.2]   Patient presents to the emergency department with complaints of nausea, vomiting, diarrhea with black stools.  She states she has been experiencing this for 2 to 3 days.  Patient states she follows with Dr Humphrey Rolls every 3 months. Denies NSAID use. Denies shortness of breath and cough. Patient drowsy. Family at bedside state her appetite has been poor.   Labs show potassium 5.3, glucose 271, BUN 99, and creatinine 6.93 with GFR 6.  CT abdomen pelvis shows 2 mm calcification partially obstructive stone impacting right ureter.  No significant upstream dilation or obstruction.   Medications: Outpatient medications: (Not in a hospital admission)   Current medications: Current Facility-Administered Medications  Medication Dose Route Frequency Provider Last Rate Last Admin   0.9 %  sodium chloride infusion   Intravenous Continuous Ivor Costa, MD       acetaminophen (TYLENOL) tablet 650 mg  650 mg Oral Q6H PRN Ivor Costa, MD       amLODipine (NORVASC) tablet 10 mg  10 mg Oral Daily Ivor Costa, MD       hydrALAZINE (APRESOLINE) injection 5 mg  5 mg Intravenous Q2H PRN Ivor Costa, MD       insulin aspart (novoLOG) injection 0-5 Units  0-5 Units Subcutaneous QHS Ivor Costa, MD       insulin aspart (novoLOG) injection 0-9 Units  0-9 Units Subcutaneous TID WC Ivor Costa, MD       multivitamin with minerals tablet 1 tablet  1  tablet Oral Daily Ivor Costa, MD       ondansetron [facility]) injection 4 mg  4 mg Intravenous Q8H PRN Ivor Costa, MD       pantoprazole (PROTONIX) injection 40 mg  40 mg Intravenous Q12H Ivor Costa, MD       Current Outpatient Medications  Medication Sig Dispense Refill   amLODipine (NORVASC) 10 MG tablet Take 10 mg by mouth daily.     glipiZIDE (GLUCOTROL) 10 MG tablet Take 10 mg by mouth 2 (two) times daily before a meal.     hydrochlorothiazide (HYDRODIURIL) 25 MG tablet Take 25 mg by mouth daily.     JANUVIA 100 MG tablet Take 100 mg by mouth daily.     losartan (COZAAR) 100 MG tablet Take 100 mg by mouth daily.     metFORMIN (GLUCOPHAGE) 1000 MG tablet Take 1,000 mg by mouth 2 (two) times daily with a meal.      Multiple Vitamin (MULTIVITAMIN) tablet Take 1 tablet by mouth daily.     rosuvastatin (CRESTOR) 40 MG tablet Take 40 mg by mouth daily.     ONGLYZA  5 MG TABS tablet 5 mg daily.  (Patient not taking: Reported on 10/28/2021)     pantoprazole (PROTONIX) 40 MG tablet Take 40 mg by mouth daily.  (Patient not taking: Reported on 10/28/2021)     simvastatin (ZOCOR) 80 MG tablet Take 80 mg by mouth daily at 6 PM.  (Patient not taking: Reported on 10/28/2021)     [place]  (Patient not taking: Reported on 10/28/2021)        Allergies: No Known Allergies    Past Medical History: Past Medical History:  Diagnosis Date   Anemia    Diabetes mellitus without complication ([place]) 6295   High cholesterol    Hypertension      Past Surgical History: Past Surgical History:  Procedure Laterality Date   ABDOMINAL HYSTERECTOMY  1981   BREAST BIOPSY Left 2005   neg core   BREAST BIOPSY Right 2015   neg core-Fibroadenoma   COLONOSCOPY WITH PROPOFOL N/A 01/07/2016   Procedure: COLONOSCOPY WITH PROPOFOL;  Surgeon: Hulen Luster, MD;  Location: [facility] ENDOSCOPY;  Service: Gastroenterology;  Laterality: N/A;   COLONOSCOPY WITH PROPOFOL N/A 05/09/2018   Procedure: COLONOSCOPY WITH  PROPOFOL;  Surgeon: Toledo, Benay Pike, MD;  Location: [facility] ENDOSCOPY;  Service: Gastroenterology;  Laterality: N/A;   ESOPHAGOGASTRODUODENOSCOPY (EGD) WITH PROPOFOL N/A 05/09/2018   Procedure: ESOPHAGOGASTRODUODENOSCOPY (EGD) WITH PROPOFOL;  Surgeon: Toledo, Benay Pike, MD;  Location: [facility] ENDOSCOPY;  Service: Gastroenterology;  Laterality: N/A;   MICROLARYNGOSCOPY WITH CO2 LASER AND EXCISION OF VOCAL CORD LESION  1979   OOPHORECTOMY     one left, don't know which one   TUBAL LIGATION  1975     Family History: Family History  Problem Relation Age of Onset   Cancer Daughter 93       pancreatic   Breast cancer Neg Hx      Social History: Social History   Socioeconomic History   Marital status: Widowed    Spouse name: Not on file   Number of children: Not on file   Years of education: Not on file   Highest education level: Not on file  Occupational History   Not on file  Tobacco Use   Smoking status: Never   Smokeless tobacco: Never  Substance and Sexual Activity   Alcohol use: No   Drug use: No   Sexual activity: Not on file  Other Topics Concern   Not on file  Social History Narrative   Not on file   Social Determinants of Health   Financial Resource Strain: Not on file  Food Insecurity: Not on file  Transportation Needs: Not on file  Physical Activity: Not on file  Stress: Not on file  Social Connections: Not on file  Intimate Partner Violence: Not on file     Review of Systems: Review of Systems  Constitutional:  Positive for malaise/fatigue. Negative for chills and fever.  HENT:  Negative for congestion, sore throat and tinnitus.   Eyes:  Negative for blurred vision and redness.  Respiratory:  Positive for cough. Negative for shortness of breath and wheezing.   Cardiovascular:  Negative for chest pain, palpitations, claudication and leg swelling.  Gastrointestinal:  Positive for blood in stool, diarrhea, nausea and vomiting. Negative for abdominal pain.   Genitourinary:  Negative for flank pain, frequency and hematuria.  Musculoskeletal:  Negative for back pain, falls and myalgias.  Skin:  Negative for rash.  Neurological:  Positive for weakness. Negative for dizziness and headaches.  Endo/Heme/Allergies:  Does not bruise/bleed easily.  Psychiatric/Behavioral:  Negative for depression. The patient is not nervous/anxious and does not have insomnia.    Vital Signs: Blood pressure (!) 107/43, pulse 92, temperature (!) 97.4 F (36.3 C), temperature source Oral, resp. rate 16, height 5\' 3"  (1.6 m), weight 53.5 kg, SpO2 100 %.  Weight trends: Filed Weights   10/28/21 0914  Weight: 53.5 kg    Physical Exam: General: NAD, drowsy  Head: Normocephalic, atraumatic.  Dry oral mucosal membranes  Eyes: Enophthalmos  Lungs:  Clear to auscultation, normal effort  Heart: Regular rate and rhythm  Abdomen:  Soft, nontender, nontender  Extremities:  No peripheral edema.  Neurologic: Nonfocal, moving all four extremities  Skin: No lesions,dry, tenting    Lab results: Basic Metabolic Panel: Recent Labs  Lab 10/28/21 0915  NA 137  K 5.3*  CL 94*  CO2 <7*  GLUCOSE 271*  BUN 99*  CREATININE 6.93*  CALCIUM 8.8*    Liver Function Tests: Recent Labs  Lab 10/28/21 0915  AST 44*  ALT 24  ALKPHOS 53  BILITOT 0.8  PROT 7.0  ALBUMIN 3.7   No results for input(s): LIPASE, AMYLASE in the last 168 hours. No results for input(s): AMMONIA in the last 168 hours.  CBC: Recent Labs  Lab 10/28/21 0915 10/28/21 1226  WBC 14.4* 13.1*  HGB 10.1* 9.3*  HCT 31.0* 28.0*  MCV 92.3 90.9  PLT 300 240    Cardiac Enzymes: No results for input(s): CKTOTAL, CKMB, CKMBINDEX, TROPONINI in the last 168 hours.  BNP: Invalid input(s): POCBNP  CBG: No results for input(s): GLUCAP in the last 168 hours.  Microbiology: Results for orders placed or performed during the hospital encounter of 10/28/21  Resp Panel by RT-PCR (Flu A&B, Covid)  Nasopharyngeal Swab     Status: None   Collection Time: 10/28/21  9:17 AM   Specimen: Nasopharyngeal Swab; Nasopharyngeal(NP) swabs in vial transport medium  Result Value Ref Range Status   SARS Coronavirus 2 by RT PCR NEGATIVE NEGATIVE Final    Comment: (NOTE) SARS-CoV-2 target nucleic acids are NOT DETECTED.  The SARS-CoV-2 RNA is generally detectable in upper respiratory specimens during the acute phase of infection. The lowest concentration of SARS-CoV-2 viral copies this assay can detect is 138 copies/mL. A negative result does not preclude SARS-Cov-2 infection and should not be used as the sole basis for treatment or other patient management decisions. A negative result may occur with  improper specimen collection/handling, submission of specimen other than nasopharyngeal swab, presence of viral mutation(s) within the areas targeted by this assay, and inadequate number of viral copies(<138 copies/mL). A negative result must be combined with clinical observations, patient history, and epidemiological information. The expected result is Negative.  Fact Sheet for Patients:  EntrepreneurPulse.com.au  Fact Sheet for Healthcare Providers:  IncredibleEmployment.be  This test is no t yet approved or cleared by the [place] FDA and  has been authorized for detection and/or diagnosis of SARS-CoV-2 by FDA under an Emergency Use Authorization (EUA). This EUA will remain  in effect (meaning this test can be used) for the duration of the COVID-19 declaration under Section 564(b)(1) of the Act, 21 U.S.C.section 360bbb-3(b)(1), unless the authorization is terminated  or revoked sooner.       Influenza A by PCR NEGATIVE NEGATIVE Final   Influenza B by PCR NEGATIVE NEGATIVE Final    Comment: (NOTE) The Xpert Xpress SARS-CoV-2/FLU/RSV plus assay is intended as an aid in the diagnosis of influenza from  Nasopharyngeal swab specimens and should not be  used as a sole basis for treatment. Nasal washings and aspirates are unacceptable for Xpert Xpress SARS-CoV-2/FLU/RSV testing.  Fact Sheet for Patients: EntrepreneurPulse.com.au  Fact Sheet for Healthcare Providers: IncredibleEmployment.be  This test is not yet approved or cleared by the [place] FDA and has been authorized for detection and/or diagnosis of SARS-CoV-2 by FDA under an Emergency Use Authorization (EUA). This EUA will remain in effect (meaning this test can be used) for the duration of the COVID-19 declaration under Section 564(b)(1) of the Act, 21 U.S.C. section 360bbb-3(b)(1), unless the authorization is terminated or revoked.  Performed at [facility], [address] [place], [place] [address]     Coagulation Studies: Recent Labs    10/28/21 0915  LABPROT 16.8*  INR 1.4*    Urinalysis: No results for input(s): COLORURINE, LABSPEC, PHURINE, GLUCOSEU, HGBUR, BILIRUBINUR, KETONESUR, PROTEINUR, UROBILINOGEN, NITRITE, LEUKOCYTESUR in the last 72 hours.  Invalid input(s): APPERANCEUR    Imaging: CT ABDOMEN PELVIS WO CONTRAST  Result Date: 10/28/2021 CLINICAL DATA:  Nausea/vomiting RLQ abdominal pain (Age >= 14y) EXAM: CT ABDOMEN AND PELVIS WITHOUT CONTRAST TECHNIQUE: Multidetector CT imaging of the abdomen and pelvis was performed following the standard protocol without IV contrast. RADIATION DOSE REDUCTION: This exam was performed according to the departmental dose-optimization program which includes automated exposure control, adjustment of the mA and/or kV according to patient size and/or use of iterative reconstruction technique. COMPARISON:  None. FINDINGS: Inferior chest: The lung bases are well-aerated. Hepatobiliary: The liver is normal in size without focal abnormality. No intrahepatic or extrahepatic biliary ductal dilation. The gallbladder appears normal. Spleen: Normal in size without focal abnormality.  Pancreas: No pancreatic ductal dilatation or surrounding inflammatory changes. Adrenals/Urinary Tract: Adrenal glands are unremarkable. The kidneys are normal in size. No hydronephrosis. There is a 2 mm calcification which appears to correspond to a partially obstructive stone impacted in the proximal right ureter. Bladder is unremarkable. Stomach/Bowel: The stomach, small bowel and large bowel are normal in caliber without abnormal wall thickening or surrounding inflammatory changes. Reproductive: Status post hysterectomy. No adnexal masses. Lymphatic: No enlarged lymph nodes in the abdomen or pelvis. Vasculature: The abdominal aorta is normal in caliber. Advanced atherosclerotic disease of the aorta. Other: No abdominopelvic ascites. Musculoskeletal: No aggressive osseous lesions. Advanced degenerative changes at L5-S1. The soft tissues are unremarkable. IMPRESSION: There is a 2 mm calcification which appears to correspond to a partially obstructive stone impacted in the proximal right ureter. No significant upstream dilation or hydronephrosis. Electronically Signed   By: Albin Felling M.D.   On: 10/28/2021 10:49     Assessment & Plan: Ms. LYNDEL DANCEL is a 76 y.o.  female with past medical history of diabetes, GERD, anemia, hypertension, lymphocytic colitis, and hyperlipidemia, who was admitted to [facility] on 10/28/2021 for GI bleeding [K92.2]  Acute kidney injury Baseline unknown.  Likely secondary to severe dehydration.  CT abdomen pelvis shows nonobstructive renal stone.  Losartan and metformin currently held.  No IV contrast exposure.  No acute indication for dialysis at this time but monitoring closely.  Will order IV hydration and other serologies including ANA, ANCA, complements, SPEP and UPEP.  Will order Foley catheter for strict I's and O's.  2.  Hyperkalemia due to kidney injury versus diarrhea.  Potassium on admission 5.3.  We will continue to monitor and assess need for [facility].  3.   Metabolic acidosis.  Serum CO2 less than 7.  Will order sodium bicarb  infusion at 125 mL/h.  We will monitor closely  4.  Hypertension of unspecified cause.  Home regimen includes amlodipine and losartan.  Losartan currently held in setting of kidney injury.  BP currently 107/43.    LOS: 0 Jeanny Rymer 1/12/20233:34 PM

## 2021-10-28 NOTE — ED Notes (Signed)
Pt given pillow per request

## 2021-10-28 NOTE — ED Notes (Signed)
Pt appears weak in bed. BIBA for gen weakness and black stool x yesterday. A/ox4, denies ABD pain or diarrhea. Denies blood in vomit. Denies blood thinners or iron supplements, dizziness, syncope, CP, SOB.

## 2021-10-28 NOTE — ED Triage Notes (Signed)
Pt BIBA for general weakness since yesterday with black stools.

## 2021-10-28 NOTE — Consult Note (Addendum)
GI Inpatient Consult Note  Reason for Consult: Melena, nausea, vomiting   Attending Requesting Consult: Dr. Blaine Hamper  History of Present Illness: Patricia Cross is a 76 y.o. female seen for evaluation of melena, nausea and vomiting, diarrhea at the request of Dr. Blaine Hamper. Patient has a PMH of lymphocytic colitis, Anemia, DM, HTN, HLD. Patient presented to the [facility] ED this morning for chief complaint of  melena, diarrhea, nausea and vomiting with malaise. Upon presentation to the ED, vital signs were stable with BP 124/41-114/50, 96, 17, 97.4, spO2 100%. She had mild abdominal tenderness and weakly positive occult positive on DRE.  Labs were significant for Hgb 10, BUN 99, creatinine 6.93, eGFR 6, CO2 <7,  K 5.3. Imaging studies: CT A/P WO contrast  revealed a 2 mm partially obstructive stone impacted in the proximal right ureter. No significant upstream dilation or hydronephrosis. No abnormal findings in the GI tract. Her last recorded BUN 16, cr 0.89, eGFR 74 in 2019 per Epic review. Her last Hgb ws 10 in 2015. She is followed by Dr. Smiley Houseman at [facility]. The patient is unaware of any kidney disease.   She has been feeling fine with exception of mild weakness onset about a month ago. She had abrupt vomiting on Tues without nausea, or feeling ill. She vomited normal gastric contents and saw no coffee grounds or blood in the emesis. After she vomited twice, she took [employer] - a few doses. Symptoms worsened yesterday with vomiting about 10 time and diarrhea >20 times. She saw no coffee grounds or signs of bleeding in the emesis, but he diarrhea was black with continued Pepto Bismol use yesterday. She denies any abdominal pain, tenderness heartburn, or reflux.    She notes her stool was brown and formed before Tues. She has not had diarrhea from her lymphocytic colitis for many years after a treatment of budesonide in 2019.  She denies recent antibiotic exposure. She is wondering if she  acquired a GI virus. She last ate yesterday oatmeal and retained it for a short time. She has been trying to stay hydrated.   She is experiencing occasional solid food dysphagia and believes she needs another esophagus dilation. No chest pain or pressure. When food hangs up mid sternum, she drinks or waits and it passes through or it comes back up. She denies food impaction, odynophagia. The vomiting is not a/w with dysphagia.  Last Colonoscopy: 05/09/2018: Indic: melena and diarrhea Impression: diverticulosis in left and right colon, normal otherwise. No repeat d/t age  Last Endoscopy: 05/09/2018: Indic Dysphagia Imp: Benign appearing esophageal stricture dilated 54 Fr Maloney erythematous mucosa changes in antrum, 1 cm HH, normal appearing duodenum  81 mg ASA, no other OAC.   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    Past Surgical History: Past Surgical History:  Procedure Laterality Date   ABDOMINAL HYSTERECTOMY  1981   BREAST BIOPSY Left 2005   neg core   BREAST BIOPSY Right 2015   neg core-Fibroadenoma   COLONOSCOPY WITH PROPOFOL N/A 01/07/2016   Procedure: COLONOSCOPY WITH PROPOFOL;  Surgeon: Hulen Luster, MD;  Location: [facility] ENDOSCOPY;  Service: Gastroenterology;  Laterality: N/A;   COLONOSCOPY WITH PROPOFOL N/A 05/09/2018   Procedure: COLONOSCOPY WITH PROPOFOL;  Surgeon: Toledo, Benay Pike, MD;  Location: [facility] ENDOSCOPY;  Service: Gastroenterology;  Laterality: N/A;    ESOPHAGOGASTRODUODENOSCOPY (EGD) WITH PROPOFOL N/A 05/09/2018   Procedure: ESOPHAGOGASTRODUODENOSCOPY (EGD) WITH PROPOFOL;  Surgeon: Toledo, Benay Pike, MD;  Location: [facility] ENDOSCOPY;  Service: Gastroenterology;  Laterality: N/A;   MICROLARYNGOSCOPY WITH CO2 LASER AND EXCISION OF VOCAL CORD LESION  1979   OOPHORECTOMY     one left, don't know which one   TUBAL LIGATION  1975    Allergies: No Known Allergies  Home Medications: (Not in a hospital admission)  Home medication reconciliation was completed with the patient.   Scheduled Inpatient Medications:    insulin aspart  0-5 Units Subcutaneous QHS   insulin aspart  0-9 Units Subcutaneous TID WC   pantoprazole (PROTONIX) IV  40 mg Intravenous Q12H    Continuous Inpatient Infusions:    sodium chloride      PRN Inpatient Medications:  acetaminophen, hydrALAZINE, ondansetron (ZOFRAN) IV  Family History: family history includes Cancer (age of onset: 38) in her daughter.  The patient's family history is negative for inflammatory bowel disorders, GI malignancy, or solid organ transplantation.  Social History:   reports that she has never smoked. She has never used smokeless tobacco. She reports that she does not drink alcohol and does not use drugs. The patient denies ETOH, tobacco, or drug use.   Review of Systems: Constitutional: Weight is stable. Positive malaise and weakness Eyes: No changes in vision. ENT: No oral lesions, sore throat.  GI: see HPI.  Heme/Lymph: No easy bruising.  CV: No chest pain.  GU: No hematuria.  Integumentary: No rashes.  Neuro: No headaches.  Psych: No depression/anxiety.  Endocrine: No heat/cold intolerance.  Allergic/Immunologic: No urticaria.  Resp: No cough, SOB.  Musculoskeletal: No joint swelling.    Physical Examination: BP (!) 114/50    Pulse 93    Temp (!) 97.4 F (36.3 C) (Oral)    Resp 17    Ht '5\' 3"'  (1.6 m)    Wt 53.5 kg    SpO2 100%    BMI 20.90 kg/m  Gen: NAD, alert and  oriented x 4, resting in bed and answers all questions completely.  HEENT: Anicteric Neck: supple, no JVD or thyromegaly Chest: CTA bilaterally, no wheezes, crackles, or other adventitious sounds CV: RRR, no m/g/c/r Abd: soft, NT in all quadrants, ND, +BS in all four quadrants; no HSM, guarding, rigidity, or rebound tenderness Ext: no edema, well perfused with 2+ pulses, Skin: no rash or lesions noted Lymph: no LAD  Data: Lab Results  Component Value Date   WBC 13.1 (H) 10/28/2021   HGB 9.3 (L) 10/28/2021   HCT 28.0 (L) 10/28/2021   MCV 90.9 10/28/2021   PLT 240 10/28/2021   Recent Labs  Lab 10/28/21 0915 10/28/21 1226  HGB 10.1* 9.3*   Lab Results  Component Value Date   NA 137 10/28/2021   K 5.3 (H) 10/28/2021   CL 94 (L) 10/28/2021   CO2 <7 (L) 10/28/2021   BUN 99 (H) 10/28/2021   CREATININE 6.93 (H) 10/28/2021   Lab Results  Component Value Date  ALT 24 10/28/2021   AST 44 (H) 10/28/2021   ALKPHOS 53 10/28/2021   BILITOT 0.8 10/28/2021   Recent Labs  Lab 10/28/21 0915 10/28/21 1226  APTT  --  33  INR 1.4*  --    CLINICAL DATA:  Nausea/vomiting RLQ abdominal pain (Age >= 14y)   EXAM: CT ABDOMEN AND PELVIS WITHOUT CONTRAST   TECHNIQUE: Multidetector CT imaging of the abdomen and pelvis was performed following the standard protocol without IV contrast.   RADIATION DOSE REDUCTION: This exam was performed according to the departmental dose-optimization program which includes automated exposure control, adjustment of the mA and/or kV according to patient size and/or use of iterative reconstruction technique.   COMPARISON:  None.   FINDINGS: Inferior chest: The lung bases are well-aerated.   Hepatobiliary: The liver is normal in size without focal abnormality. No intrahepatic or extrahepatic biliary ductal dilation. The gallbladder appears normal.   Spleen: Normal in size without focal abnormality.   Pancreas: No pancreatic ductal dilatation or  surrounding inflammatory changes.   Adrenals/Urinary Tract: Adrenal glands are unremarkable. The kidneys are normal in size. No hydronephrosis. There is a 2 mm calcification which appears to correspond to a partially obstructive stone impacted in the proximal right ureter. Bladder is unremarkable.   Stomach/Bowel: The stomach, small bowel and large bowel are normal in caliber without abnormal wall thickening or surrounding inflammatory changes.   Reproductive: Status post hysterectomy. No adnexal masses.   Lymphatic: No enlarged lymph nodes in the abdomen or pelvis.   Vasculature: The abdominal aorta is normal in caliber. Advanced atherosclerotic disease of the aorta.   Other: No abdominopelvic ascites.   Musculoskeletal: No aggressive osseous lesions. Advanced degenerative changes at L5-S1. The soft tissues are unremarkable.   IMPRESSION: There is a 2 mm calcification which appears to correspond to a partially obstructive stone impacted in the proximal right ureter. No significant upstream dilation or hydronephrosis.     Electronically Signed   By: Albin Felling M.D.   On: 10/28/2021 10:49  Assessment/Plan:  76 y/o AA female with a PMH of HTN, HLD, diabetes mellitus, GERD, anemia, Hx of lymphocytic colitis who presented to the [facility] ED this morning for chief complaint of melena, diarrhea, nausea, vomiting, and malaise. GI consulted for further evaluation and management.   Reported Melena - Dark stools most likely secondary to consumption of Pepto Bismol but based on history alone cannot rule out other etiology for UGI bleed to include DDX : Peptic ulcer, erosive gastritis, arteriovenous malformation. She has a small HH and would not expect Cameron erosions. She does take an 81 mg aspirin daily but no other over the counter NSAIDs.  No alcohol use, tobacco products, or anticoagulants. No previous GI bleed, liver disease, coagulopathy. Her recent EGD in 2019 showed benign  appearing esophageal stricture, erythematous mucosa changes in antrum, 1 cm HH, normal appearing duodenum. She has no abdominal  tenderness and denies any abdominal pain. She has seen no coffee ground emesis or blood in her emesis. Hgb is 10.1- 9.3 and near baseline in comparison to 2015. She is she is hemodynamically stable.   Vomiting: - DDx includes acute viral gastroenteritis,  duodenitis, gastritis, PUD  Diarrhea - Acute GI etiology to include viral or bacterial gastroenteritis, C diff, lymphocytic colitis flare. She has no abdominal tenderness on exam. She denies any abdominal pain with onset of vomiting and diarrhea 2 days ago. CT of A/P WO showed unremarkable stomach, small bowel and colon.   AKI -  Appears to be new onset renal disease and likely related to severe vomiting and diarrhea with dehydration. Nephrology is consulted.   Dysphagia - Hx of known esophageal stricture with most recent dilation in 2019.   Recommendations:  - Monitor serial H/H and transfuse if Hgb <7.0. Monitor for overt GI bleeding.   - Given hgb around documented baseline, will manage conservatively without endoscopy at this time given severe renal dysfunction and acidosis. Pepto bismol likely etiology of dark stools. FOBT is not indicated in inpatient setting and should not be used to determine management. If acute changes with signs of bleeding and improvement of renal function, can later consider EGD. Will plan for outpatient f/u and possible egd for reports of dysphagia.  - Check H pylori stool antigen.   - Continue Pantoprazole 40 mg IV every 12 hours for gastric protection.   - Recommend Stool for PCR, C diff. If negative, can consider fecal calprotectin +/- flex sig as outpatient  - Agree with Nephrology consult. Appreciate nephrology recs.    Thank you for the consult. Please call with questions or concerns.  Denice Paradise, [facility] [phone]

## 2021-10-28 NOTE — H&P (Signed)
History and Physical    Patricia Cross JEH:[id number] DOB: [birth year] DOA: 10/28/2021  Referring MD/NP/PA:   PCP: Perrin Maltese, MD   Patient coming from:  The patient is coming from home.  At baseline, pt is independent for most of ADL.        Chief Complaint: Black stool, nausea, vomiting, diarrhea  HPI: Patricia Cross is a 76 y.o. female with medical history significant of lymphocytic colitis, hypertension, hyperlipidemia, diabetes mellitus, GERD, anemia, who presents with black stool, nausea, vomiting, diarrhea.  Patient states that her symptoms have been going on for more than 3 days.  Patient has nausea, vomiting, diarrhea.  She states that she has at least 10 times of nonbilious nonbloody vomiting each day.  Patient has more than 20 times of black-colored diarrhea.  Denies abdominal pain.  No fever or chills.  Patient does not have chest pain, cough, shortness breath.  No symptoms of UTI.  Patient has generalized weakness, no unilateral numbness or tinglings extremities.  ED Course: pt was found to have hemoglobin 10.9 on 03/19/2014 --> 10.1 --> 9.3, WBC 14.4, negative COVID PCR, positive FOBT, potassium 5.3, AKI with creatinine 6.93, BUN 99, GFR 6 (no baseline creatinine available), bicarbonate <7.0, INR 1.4, temperature normal, blood pressure 114/50, heart rate 93, RR 17, oxygen saturation 100% on room air.  Patient is admitted to [facility] bed as inpatient.  Dr. Virgina Jock of GI and Dr. Holley Raring of renal are consulted.  CT of abdomen/pelvis: There is a 2 mm calcification which appears to correspond to a partially obstructive stone impacted in the proximal right ureter. No significant upstream dilation or hydronephrosis.   Review of Systems:   General: no fevers, chills, no body weight gain, has poor appetite, has fatigue HEENT: no blurry vision, hearing changes or sore throat Respiratory: no dyspnea, coughing, wheezing CV: no chest pain, no palpitations GI: has nausea,  vomiting, diarrhea, no constipation, abdominal pain,  GU: no dysuria, burning on urination, increased urinary frequency, hematuria  Ext: no leg edema Neuro: no unilateral weakness, numbness, or tingling, no vision change or hearing loss Skin: no rash, no skin tear. MSK: No muscle spasm, no deformity, no limitation of range of movement in spin Heme: No easy bruising.  Travel history: No recent long distant travel.  Allergy: No Known Allergies  Past Medical History:  Diagnosis Date   Anemia    Diabetes mellitus without complication ([place]) 3785   High cholesterol    Hypertension     Past Surgical History:  Procedure Laterality Date   ABDOMINAL HYSTERECTOMY  1981   BREAST BIOPSY Left 2005   neg core   BREAST BIOPSY Right 2015   neg core-Fibroadenoma   COLONOSCOPY WITH PROPOFOL N/A 01/07/2016   Procedure: COLONOSCOPY WITH PROPOFOL;  Surgeon: Hulen Luster, MD;  Location: [facility] ENDOSCOPY;  Service: Gastroenterology;  Laterality: N/A;   COLONOSCOPY WITH PROPOFOL N/A 05/09/2018   Procedure: COLONOSCOPY WITH PROPOFOL;  Surgeon: Toledo, Benay Pike, MD;  Location: [facility] ENDOSCOPY;  Service: Gastroenterology;  Laterality: N/A;   ESOPHAGOGASTRODUODENOSCOPY (EGD) WITH PROPOFOL N/A 05/09/2018   Procedure: ESOPHAGOGASTRODUODENOSCOPY (EGD) WITH PROPOFOL;  Surgeon: Toledo, Benay Pike, MD;  Location: [facility] ENDOSCOPY;  Service: Gastroenterology;  Laterality: N/A;   MICROLARYNGOSCOPY WITH CO2 LASER AND EXCISION OF VOCAL CORD LESION  1979   OOPHORECTOMY     one left, don't know which one   TUBAL LIGATION  1975    Social History:  reports that she has never smoked. She has never  used smokeless tobacco. She reports that she does not drink alcohol and does not use drugs.  Family History:  Family History  Problem Relation Age of Onset   Cancer Daughter 32       pancreatic   Breast cancer Neg Hx      Prior to Admission medications   Medication Sig Start Date End Date Taking? Authorizing Provider   amLODipine (NORVASC) 10 MG tablet Take 10 mg by mouth daily. 09/11/21   [provider]  glipiZIDE (GLUCOTROL) 10 MG tablet Take 10 mg by mouth 2 (two) times daily before a meal.    [provider]  hydrochlorothiazide (HYDRODIURIL) 25 MG tablet Take 25 mg by mouth daily. 09/01/21   [provider]  JANUVIA 100 MG tablet Take 100 mg by mouth daily. 09/01/21   [provider]  losartan (COZAAR) 100 MG tablet Take 100 mg by mouth daily. 10/12/21   [provider]  metFORMIN (GLUCOPHAGE) 1000 MG tablet Take 1,000 mg by mouth 2 (two) times daily with a meal.  12/02/13   [provider]  ONGLYZA 5 MG TABS tablet 5 mg daily.  11/23/13   [provider]  pantoprazole (PROTONIX) 40 MG tablet Take 40 mg by mouth daily.  12/03/13   [provider]  rosuvastatin (CRESTOR) 40 MG tablet Take 40 mg by mouth daily. 05/25/21   [provider]  simvastatin (ZOCOR) 80 MG tablet Take 80 mg by mouth daily at 6 PM.  11/23/13   [provider]  [place]  11/26/13   [provider]    Physical Exam: Vitals:   10/28/21 0914 10/28/21 0920 10/28/21 1130  BP:  (!) 124/41 (!) 114/50  Pulse:  96 93  Resp:  17 17  Temp:  (!) 97.4 F (36.3 C)   TempSrc:  Oral   SpO2:  100% 100%  Weight: 53.5 kg    Height: 5\' 3"  (1.6 m)     General: Not in acute distress.  Dry mucosal membrane HEENT:       Eyes: PERRL, EOMI, no scleral icterus.       ENT: No discharge from the ears and nose, no pharynx injection, no tonsillar enlargement.        Neck: No JVD, no bruit, no mass felt. Heme: No neck lymph node enlargement. Cardiac: S1/S2, RRR, No murmurs, No gallops or rubs. Respiratory: No rales, wheezing, rhonchi or rubs. GI: Soft, nondistended, nontender, no rebound pain, no organomegaly, BS present. GU: No hematuria Ext: No pitting leg edema bilaterally. 1+DP/PT pulse bilaterally. Musculoskeletal: No joint deformities, No  joint redness or warmth, no limitation of ROM in spin. Skin: No rashes.  Neuro: Alert, oriented X3, cranial nerves II-XII grossly intact, moves all extremities normally. Psych: Patient is not psychotic, no suicidal or hemocidal ideation.  Labs on Admission: I have personally reviewed following labs and imaging studies  CBC: Recent Labs  Lab 10/28/21 0915 10/28/21 1226  WBC 14.4* 13.1*  HGB 10.1* 9.3*  HCT 31.0* 28.0*  MCV 92.3 90.9  PLT 300 800   Basic Metabolic Panel: Recent Labs  Lab 10/28/21 0915  NA 137  K 5.3*  CL 94*  CO2 <7*  GLUCOSE 271*  BUN 99*  CREATININE 6.93*  CALCIUM 8.8*   GFR: Estimated Creatinine Clearance: 5.6 mL/min (A) (by C-G formula based on SCr of 6.93 mg/dL (H)). Liver Function Tests: Recent Labs  Lab 10/28/21 0915  AST 44*  ALT 24  ALKPHOS 53  BILITOT 0.8  PROT 7.0  ALBUMIN 3.7   No results for input(s): LIPASE, AMYLASE in the last 168 hours. No results for input(s): AMMONIA in the last 168 hours. Coagulation Profile: Recent Labs  Lab 10/28/21 0915  INR 1.4*   Cardiac Enzymes: No results for input(s): CKTOTAL, CKMB, CKMBINDEX, TROPONINI in the last 168 hours. BNP (last 3 results) No results for input(s): PROBNP in the last 8760 hours. HbA1C: No results for input(s): HGBA1C in the last 72 hours. CBG: No results for input(s): GLUCAP in the last 168 hours. Lipid Profile: No results for input(s): CHOL, HDL, LDLCALC, TRIG, CHOLHDL, LDLDIRECT in the last 72 hours. Thyroid Function Tests: No results for input(s): TSH, T4TOTAL, FREET4, T3FREE, THYROIDAB in the last 72 hours. Anemia Panel: No results for input(s): VITAMINB12, FOLATE, FERRITIN, TIBC, IRON, RETICCTPCT in the last 72 hours. Urine analysis: No results found for: COLORURINE, APPEARANCEUR, LABSPEC, PHURINE, GLUCOSEU, HGBUR, BILIRUBINUR, KETONESUR, PROTEINUR, UROBILINOGEN, NITRITE, LEUKOCYTESUR Sepsis Labs: @LABRCNTIP (procalcitonin:4,lacticidven:4) ) Recent Results (from  the past 240 hour(s))  Resp Panel by RT-PCR (Flu A&B, Covid) Nasopharyngeal Swab     Status: None   Collection Time: 10/28/21  9:17 AM   Specimen: Nasopharyngeal Swab; Nasopharyngeal(NP) swabs in vial transport medium  Result Value Ref Range Status   SARS Coronavirus 2 by RT PCR NEGATIVE NEGATIVE Final    Comment: (NOTE) SARS-CoV-2 target nucleic acids are NOT DETECTED.  The SARS-CoV-2 RNA is generally detectable in upper respiratory specimens during the acute phase of infection. The lowest concentration of SARS-CoV-2 viral copies this assay can detect is 138 copies/mL. A negative result does not preclude SARS-Cov-2 infection and should not be used as the sole basis for treatment or other patient management decisions. A negative result may occur with  improper specimen collection/handling, submission of specimen other than nasopharyngeal swab, presence of viral mutation(s) within the areas targeted by this assay, and inadequate number of viral copies(<138 copies/mL). A negative result must be combined with clinical observations, patient history, and epidemiological information. The expected result is Negative.  Fact Sheet for Patients:  EntrepreneurPulse.com.au  Fact Sheet for Healthcare Providers:  IncredibleEmployment.be  This test is no t yet approved or cleared by the [place] FDA and  has been authorized for detection and/or diagnosis of SARS-CoV-2 by FDA under an Emergency Use Authorization (EUA). This EUA will remain  in effect (meaning this test can be used) for the duration of the COVID-19 declaration under Section 564(b)(1) of the Act, 21 U.S.C.section 360bbb-3(b)(1), unless the authorization is terminated  or revoked sooner.       Influenza A by PCR NEGATIVE NEGATIVE Final   Influenza B by PCR NEGATIVE NEGATIVE Final    Comment: (NOTE) The Xpert Xpress SARS-CoV-2/FLU/RSV plus assay is intended as an aid in the diagnosis of  influenza from Nasopharyngeal swab specimens and should not be used as a sole basis for treatment. Nasal washings and aspirates are unacceptable for Xpert Xpress SARS-CoV-2/FLU/RSV testing.  Fact Sheet for Patients: EntrepreneurPulse.com.au  Fact Sheet for Healthcare Providers: IncredibleEmployment.be  This test is not yet approved or cleared by the [place] FDA and has been authorized for detection and/or diagnosis of SARS-CoV-2 by FDA under an Emergency Use Authorization (EUA). This EUA will remain in effect (meaning this test can be used) for the duration of the COVID-19 declaration under Section 564(b)(1) of the Act, 21 U.S.C. section 360bbb-3(b)(1), unless the authorization is terminated or revoked.  Performed at [facility], [address] [place], [place] [address]  Radiological Exams on Admission: CT ABDOMEN PELVIS WO CONTRAST  Result Date: 10/28/2021 CLINICAL DATA:  Nausea/vomiting RLQ abdominal pain (Age >= 14y) EXAM: CT ABDOMEN AND PELVIS WITHOUT CONTRAST TECHNIQUE: Multidetector CT imaging of the abdomen and pelvis was performed following the standard protocol without IV contrast. RADIATION DOSE REDUCTION: This exam was performed according to the departmental dose-optimization program which includes automated exposure control, adjustment of the mA and/or kV according to patient size and/or use of iterative reconstruction technique. COMPARISON:  None. FINDINGS: Inferior chest: The lung bases are well-aerated. Hepatobiliary: The liver is normal in size without focal abnormality. No intrahepatic or extrahepatic biliary ductal dilation. The gallbladder appears normal. Spleen: Normal in size without focal abnormality. Pancreas: No pancreatic ductal dilatation or surrounding inflammatory changes. Adrenals/Urinary Tract: Adrenal glands are unremarkable. The kidneys are normal in size. No hydronephrosis. There is a 2 mm  calcification which appears to correspond to a partially obstructive stone impacted in the proximal right ureter. Bladder is unremarkable. Stomach/Bowel: The stomach, small bowel and large bowel are normal in caliber without abnormal wall thickening or surrounding inflammatory changes. Reproductive: Status post hysterectomy. No adnexal masses. Lymphatic: No enlarged lymph nodes in the abdomen or pelvis. Vasculature: The abdominal aorta is normal in caliber. Advanced atherosclerotic disease of the aorta. Other: No abdominopelvic ascites. Musculoskeletal: No aggressive osseous lesions. Advanced degenerative changes at L5-S1. The soft tissues are unremarkable. IMPRESSION: There is a 2 mm calcification which appears to correspond to a partially obstructive stone impacted in the proximal right ureter. No significant upstream dilation or hydronephrosis. Electronically Signed   By: Albin Felling M.D.   On: 10/28/2021 10:49     EKG: I have personally reviewed.  Sinus rhythm, QTC 487, low voltage, early R wave progression, noted to be acutely  Assessment/Plan Principal Problem:   GI bleeding Active Problems:   Hypertension   Type II diabetes mellitus with renal manifestations (HCC)   AKI (acute kidney injury) (HCC)   Hyperkalemia   Right ureteral stone   Leukocytosis   Nausea vomiting and diarrhea   Normocytic anemia   HLD (hyperlipidemia)   GI bleeding and nausea vomiting and diarrhea with black stool: Hgb 10.9 in 2015 -- > 10.1 today. No recent Hgb available.  Patient has history of lymphocytic colitis per patient.  Consulted Dr. Virgina Jock of GI.  CT scan of abdomen/pelvis is negative for acute intra-abdominal issues except for a small partially obstructive right ureteral stone.  - will admitted to med-surg bed as inpatient - IVF: 500 ml x 2 NS bolus, then at 125 mL/hr - Start IV pantoprazole 40 mg bid - check C diff and GI path panel - Zofran IV for nausea - Avoid NSAIDs and SQ heparin - Maintain  IV access (2 large bore IVs if possible). - Monitor closely and follow q6h cbc, transfuse as necessary, if Hgb<7.0 - LaB: INR, PTT and type screen  AKI (acute kidney injury): Patient has severe AKI, no baseline creatinine available.  Likely mainly due to dehydration and continuation of diuretics and Cozaar.  CT scan showed a small 2 mm partially obstructive right ureteral stone, but no hydronephrosis.  Consulted with Dr. Holley Raring of renal -Hold Cozaar, HCTZ -IV fluid as above,  -Follow-up with BMP -f/u UA  Hypertension -IV hydralazine as needed -Hold HCTZ and Cozaar -Amlodipine,  Type II diabetes mellitus with renal manifestations ([place]): COVID will, blood sugar 271.  Patient is taking metformin, Januvia and glipizide at home -Sliding scale insulin -Check A1c  Hyperkalemia: K  5.3 -IVF as above -give 50 mEq of sodium bicarbonate with IV fluid -repeat K level in afternoon at 3:00 PM  Right ureteral stone: Consulted Dr. Karie Fetch of urology -f/u Dr. Doristine Counter recommendations  Leukocytosis: WBC 14.4. No fever -f/u UA and  -f/u C diff   Normocytic anemia:  -f/u CBC q6h  HLD: -hold Cretor due to severe AKI per pharmacist recommendation   DVT ppx: SCD Code Status: Full code Family Communication:  Yes, patient's sister and grandson   at bed side. Disposition Plan:  Anticipate discharge back to previous environment Consults called:   Dr. Virgina Jock of GI and Dr. Holley Raring of renal are consulted. Admission status and Level of care: Telemetry Medical:     as inpt         Status is: Inpatient  Remains inpatient appropriate because: Patient has multiple comorbidities, now presents with nausea, vomiting, diarrhea with black stool.  Hemoglobin dropped from 10.9-9.3.  Patient also has a severe AKI, hyperkalemia, right ureteral stone.  Her presentation is highly complicated.  Patient at high risk of deteriorating.  Need to be treated in hospital for at least 2 days.           Date of  Service 10/28/2021    Ivor Costa [facility]   If 7PM-7AM, please contact night-coverage [email or web address] 10/28/2021, 3:03 PM

## 2021-10-28 NOTE — ED Notes (Signed)
Patient transported to CT 

## 2021-10-29 ENCOUNTER — Inpatient Hospital Stay: Payer: Medicare Other

## 2021-10-29 DIAGNOSIS — E875 Hyperkalemia: Secondary | ICD-10-CM

## 2021-10-29 DIAGNOSIS — N179 Acute kidney failure, unspecified: Secondary | ICD-10-CM

## 2021-10-29 DIAGNOSIS — K922 Gastrointestinal hemorrhage, unspecified: Secondary | ICD-10-CM

## 2021-10-29 LAB — IRON AND TIBC
Iron: 43 ug/dL (ref 28–170)
Saturation Ratios: 15 % (ref 10.4–31.8)
TIBC: 293 ug/dL (ref 250–450)
UIBC: 250 ug/dL

## 2021-10-29 LAB — CBC
HCT: 27.5 % — ABNORMAL LOW (ref 36.0–46.0)
HCT: 28 % — ABNORMAL LOW (ref 36.0–46.0)
Hemoglobin: 9.7 g/dL — ABNORMAL LOW (ref 12.0–15.0)
Hemoglobin: 10 g/dL — ABNORMAL LOW (ref 12.0–15.0)
MCH: 29.6 pg (ref 26.0–34.0)
MCH: 29.7 pg (ref 26.0–34.0)
MCHC: 35.3 g/dL (ref 30.0–36.0)
MCHC: 35.7 g/dL (ref 30.0–36.0)
MCV: 83.8 fL (ref 80.0–100.0)
MCV: 83.1 fL (ref 80.0–100.0)
Platelets: 239 10*3/uL (ref 150–400)
Platelets: 227 10*3/uL (ref 150–400)
RBC: 3.28 MIL/uL — ABNORMAL LOW (ref 3.87–5.11)
RBC: 3.37 MIL/uL — ABNORMAL LOW (ref 3.87–5.11)
RDW: 18 % — ABNORMAL HIGH (ref 11.5–15.5)
RDW: 17.9 % — ABNORMAL HIGH (ref 11.5–15.5)
WBC: 12.1 10*3/uL — ABNORMAL HIGH (ref 4.0–10.5)
WBC: 8.5 10*3/uL (ref 4.0–10.5)
nRBC: 0 % (ref 0.0–0.2)
nRBC: 0 % (ref 0.0–0.2)

## 2021-10-29 LAB — GLUCOSE, CAPILLARY
Glucose-Capillary: 206 mg/dL — ABNORMAL HIGH (ref 70–99)
Glucose-Capillary: 177 mg/dL — ABNORMAL HIGH (ref 70–99)
Glucose-Capillary: 177 mg/dL — ABNORMAL HIGH (ref 70–99)
Glucose-Capillary: 61 mg/dL — ABNORMAL LOW (ref 70–99)
Glucose-Capillary: 57 mg/dL — ABNORMAL LOW (ref 70–99)
Glucose-Capillary: 61 mg/dL — ABNORMAL LOW (ref 70–99)
Glucose-Capillary: 71 mg/dL (ref 70–99)

## 2021-10-29 LAB — FERRITIN: Ferritin: 153 ng/mL (ref 11–307)

## 2021-10-29 LAB — BASIC METABOLIC PANEL
Anion gap: 19 — ABNORMAL HIGH (ref 5–15)
BUN: 102 mg/dL — ABNORMAL HIGH (ref 8–23)
CO2: 25 mmol/L (ref 22–32)
Calcium: 7.6 mg/dL — ABNORMAL LOW (ref 8.9–10.3)
Chloride: 95 mmol/L — ABNORMAL LOW (ref 98–111)
Creatinine, Ser: 6.65 mg/dL — ABNORMAL HIGH (ref 0.44–1.00)
GFR, Estimated: 6 mL/min — ABNORMAL LOW (ref >60)
Glucose, Bld: 243 mg/dL — ABNORMAL HIGH (ref 70–99)
Potassium: 3.7 mmol/L (ref 3.5–5.1)
Sodium: 139 mmol/L (ref 135–145)

## 2021-10-29 LAB — VITAMIN B12: Vitamin B-12: 525 pg/mL (ref 180–914)

## 2021-10-29 LAB — HEMOGLOBIN A1C
Hgb A1c MFr Bld: 8.2 % — ABNORMAL HIGH (ref 4.8–5.6)
Mean Plasma Glucose: 188.64 mg/dL

## 2021-10-29 MED ORDER — INSULIN ASPART 100 UNIT/ML IJ SOLN
4.0000 [IU] | Freq: Three times a day (TID) | INTRAMUSCULAR | Status: DC
  Administered 2021-10-29: 4 [IU] via SUBCUTANEOUS
  Filled 2021-10-29: qty 1

## 2021-10-29 MED ORDER — LACTATED RINGERS IV SOLN: INTRAVENOUS | Status: DC

## 2021-10-29 MED ORDER — INSULIN DETEMIR 100 UNIT/ML ~~LOC~~ SOLN
12.0000 [IU] | Freq: Every day | SUBCUTANEOUS | Status: DC
  Administered 2021-10-29: 12 [IU] via SUBCUTANEOUS
  Filled 2021-10-29 (×3): qty 0.12

## 2021-10-29 MED ORDER — CHLORHEXIDINE GLUCONATE CLOTH 2 % EX PADS
6.0000 | MEDICATED_PAD | Freq: Every day | CUTANEOUS | Status: DC
  Administered 2021-10-30 – 2021-11-03 (×5): 6 via TOPICAL

## 2021-10-29 NOTE — Plan of Care (Signed)

## 2021-10-29 NOTE — Progress Notes (Signed)
[facility] Inpatient Progress Note  Subjective: Patient is seen for reported melena, vomiting, diarrhea, AKI. Overnight she reports one episode of tarry diarrhea. Hgb remains unchanged from yesterday. VSS. She continues to report no abdominal pain or nausea. She enjoyed her clear liquids last evening without any vomiting and feels hungry today.UGI study to be done this morning to evaluate for gastric/peptic ulcer and stricture. Her mentation is unchanged from last evening and she feels stronger.  Objective: Vital signs in last 24 hours: Temp:  [97.4 F (36.3 C)-99 F (37.2 C)] 99 F (37.2 C) (01/13 0335) Pulse Rate:  [84-96] 85 (01/13 0335) Resp:  [16-20] 20 (01/13 0335) BP: (94-124)/(36-50) 105/44 (01/13 0335) SpO2:  [93 %-100 %] 97 % (01/13 0335) Weight:  [53.5 kg] 53.5 kg (01/12 0914) Blood pressure (!) 105/44, pulse 85, temperature 99 F (37.2 C), temperature source Oral, resp. rate 20, height 5\' 3"  (1.6 m), weight 53.5 kg, SpO2 97 %.    Intake/Output from previous day: 01/12 0701 - 01/13 0700 In: 1162.2 [I.V.:1162.2] Out: 0   Intake/Output this shift: No intake/output data recorded.   Gen: NAD. Appears comfortable. Very pleasant and conversational today reminiscing about her life in [place] and move to [place].  HEENT: Normocephalic   Chest: CTA, no wheezes.  CV: RR nl S1, S2. No gallops.  Abd: soft, non tender in all quads, flat and soft with  BS+  Ext: no edema bilateral  Neuro: Alert and oriented. Judgement appears normal. Nonfocal.   Lab Results: Results for orders placed or performed during the hospital encounter of 10/28/21 (from the past 24 hour(s))  CBC     Status: Abnormal   Collection Time: 10/28/21  9:15 AM  Result Value Ref Range   WBC 14.4 (H) 4.0 - 10.5 K/uL   RBC 3.36 (L) 3.87 - 5.11 MIL/uL   Hemoglobin 10.1 (L) 12.0 - 15.0 g/dL   HCT 31.0 (L) 36.0 - 46.0 %   MCV 92.3 80.0 - 100.0 fL   MCH 30.1 26.0 - 34.0 pg   MCHC 32.6 30.0 -  36.0 g/dL   RDW 19.1 (H) 11.5 - 15.5 %   Platelets 300 150 - 400 K/uL   nRBC 0.0 0.0 - 0.2 %  Comprehensive metabolic panel     Status: Abnormal   Collection Time: 10/28/21  9:15 AM  Result Value Ref Range   Sodium 137 135 - 145 mmol/L   Potassium 5.3 (H) 3.5 - 5.1 mmol/L   Chloride 94 (L) 98 - 111 mmol/L   CO2 <7 (L) 22 - 32 mmol/L   Glucose, Bld 271 (H) 70 - 99 mg/dL   BUN 99 (H) 8 - 23 mg/dL   Creatinine, Ser 6.93 (H) 0.44 - 1.00 mg/dL   Calcium 8.8 (L) 8.9 - 10.3 mg/dL   Total Protein 7.0 6.5 - 8.1 g/dL   Albumin 3.7 3.5 - 5.0 g/dL   AST 44 (H) 15 - 41 U/L   ALT 24 0 - 44 U/L   Alkaline Phosphatase 53 38 - 126 U/L   Total Bilirubin 0.8 0.3 - 1.2 mg/dL   GFR, Estimated 6 (L) >60 mL/min   Anion gap NOT CALCULATED 5 - 15  Protime-INR     Status: Abnormal   Collection Time: 10/28/21  9:15 AM  Result Value Ref Range   Prothrombin Time 16.8 (H) 11.4 - 15.2 seconds   INR 1.4 (H) 0.8 - 1.2  Resp Panel by RT-PCR (Flu A&B, Covid) Nasopharyngeal Swab  Status: None   Collection Time: 10/28/21  9:17 AM   Specimen: Nasopharyngeal Swab; Nasopharyngeal(NP) swabs in vial transport medium  Result Value Ref Range   SARS Coronavirus 2 by RT PCR NEGATIVE NEGATIVE   Influenza A by PCR NEGATIVE NEGATIVE   Influenza B by PCR NEGATIVE NEGATIVE  APTT     Status: None   Collection Time: 10/28/21 12:26 PM  Result Value Ref Range   aPTT 33 24 - 36 seconds  CBC     Status: Abnormal   Collection Time: 10/28/21 12:26 PM  Result Value Ref Range   WBC 13.1 (H) 4.0 - 10.5 K/uL   RBC 3.08 (L) 3.87 - 5.11 MIL/uL   Hemoglobin 9.3 (L) 12.0 - 15.0 g/dL   HCT 28.0 (L) 36.0 - 46.0 %   MCV 90.9 80.0 - 100.0 fL   MCH 30.2 26.0 - 34.0 pg   MCHC 33.2 30.0 - 36.0 g/dL   RDW 18.9 (H) 11.5 - 15.5 %   Platelets 240 150 - 400 K/uL   nRBC 0.0 0.0 - 0.2 %  Type and screen     Status: None   Collection Time: 10/28/21 12:26 PM  Result Value Ref Range   ABO/RH(D) O POS    Antibody Screen NEG    Sample  Expiration      10/31/2021,2359 Performed at [facility], [address] [place], [place] [address]   CBG monitoring, ED     Status: Abnormal   Collection Time: 10/28/21  4:30 PM  Result Value Ref Range   Glucose-Capillary 287 (H) 70 - 99 mg/dL  CBC     Status: Abnormal   Collection Time: 10/28/21  6:16 PM  Result Value Ref Range   WBC 13.3 (H) 4.0 - 10.5 K/uL   RBC 3.10 (L) 3.87 - 5.11 MIL/uL   Hemoglobin 9.3 (L) 12.0 - 15.0 g/dL   HCT 26.9 (L) 36.0 - 46.0 %   MCV 86.8 80.0 - 100.0 fL   MCH 30.0 26.0 - 34.0 pg   MCHC 34.6 30.0 - 36.0 g/dL   RDW 18.6 (H) 11.5 - 15.5 %   Platelets 245 150 - 400 K/uL   nRBC 0.0 0.0 - 0.2 %  Hemoglobin A1c     Status: Abnormal   Collection Time: 10/28/21  6:16 PM  Result Value Ref Range   Hgb A1c MFr Bld 8.2 (H) 4.8 - 5.6 %   Mean Plasma Glucose 188.64 mg/dL  Potassium     Status: None   Collection Time: 10/28/21  6:16 PM  Result Value Ref Range   Potassium 4.6 3.5 - 5.1 mmol/L  Glucose, capillary     Status: Abnormal   Collection Time: 10/28/21  8:51 PM  Result Value Ref Range   Glucose-Capillary 268 (H) 70 - 99 mg/dL  Glucose, capillary     Status: Abnormal   Collection Time: 10/28/21  9:46 PM  Result Value Ref Range   Glucose-Capillary 301 (H) 70 - 99 mg/dL  Basic metabolic panel     Status: Abnormal   Collection Time: 10/29/21  4:57 AM  Result Value Ref Range   Sodium 139 135 - 145 mmol/L   Potassium 3.7 3.5 - 5.1 mmol/L   Chloride 95 (L) 98 - 111 mmol/L   CO2 25 22 - 32 mmol/L   Glucose, Bld 243 (H) 70 - 99 mg/dL   BUN 102 (H) 8 - 23 mg/dL   Creatinine, Ser 6.65 (H) 0.44 - 1.00 mg/dL  Calcium 7.6 (L) 8.9 - 10.3 mg/dL   GFR, Estimated 6 (L) >60 mL/min   Anion gap 19 (H) 5 - 15  CBC     Status: Abnormal   Collection Time: 10/29/21  4:57 AM  Result Value Ref Range   WBC 12.1 (H) 4.0 - 10.5 K/uL   RBC 3.28 (L) 3.87 - 5.11 MIL/uL   Hemoglobin 9.7 (L) 12.0 - 15.0 g/dL   HCT 27.5 (L) 36.0 - 46.0 %   MCV 83.8 80.0 -  100.0 fL   MCH 29.6 26.0 - 34.0 pg   MCHC 35.3 30.0 - 36.0 g/dL   RDW 18.0 (H) 11.5 - 15.5 %   Platelets 239 150 - 400 K/uL   nRBC 0.0 0.0 - 0.2 %     Recent Labs    10/28/21 1226 10/28/21 1816 10/29/21 0457  WBC 13.1* 13.3* 12.1*  HGB 9.3* 9.3* 9.7*  HCT 28.0* 26.9* 27.5*  PLT 240 245 239   BMET Recent Labs    10/28/21 0915 10/28/21 1816 10/29/21 0457  NA 137  --  139  K 5.3* 4.6 3.7  CL 94*  --  95*  CO2 <7*  --  25  GLUCOSE 271*  --  243*  BUN 99*  --  102*  CREATININE 6.93*  --  6.65*  CALCIUM 8.8*  --  7.6*   LFT Recent Labs    10/28/21 0915  PROT 7.0  ALBUMIN 3.7  AST 44*  ALT 24  ALKPHOS 53  BILITOT 0.8   PT/INR Recent Labs    10/28/21 0915  LABPROT 16.8*  INR 1.4*   Hepatitis Panel No results for input(s): HEPBSAG, HCVAB, HEPAIGM, HEPBIGM in the last 72 hours. C-Diff No results for input(s): CDIFFTOX in the last 72 hours. No results for input(s): CDIFFPCR in the last 72 hours.   Studies/Results: CT ABDOMEN PELVIS WO CONTRAST  Result Date: 10/28/2021 CLINICAL DATA:  Nausea/vomiting RLQ abdominal pain (Age >= 14y) EXAM: CT ABDOMEN AND PELVIS WITHOUT CONTRAST TECHNIQUE: Multidetector CT imaging of the abdomen and pelvis was performed following the standard protocol without IV contrast. RADIATION DOSE REDUCTION: This exam was performed according to the departmental dose-optimization program which includes automated exposure control, adjustment of the mA and/or kV according to patient size and/or use of iterative reconstruction technique. COMPARISON:  None. FINDINGS: Inferior chest: The lung bases are well-aerated. Hepatobiliary: The liver is normal in size without focal abnormality. No intrahepatic or extrahepatic biliary ductal dilation. The gallbladder appears normal. Spleen: Normal in size without focal abnormality. Pancreas: No pancreatic ductal dilatation or surrounding inflammatory changes. Adrenals/Urinary Tract: Adrenal glands are unremarkable.  The kidneys are normal in size. No hydronephrosis. There is a 2 mm calcification which appears to correspond to a partially obstructive stone impacted in the proximal right ureter. Bladder is unremarkable. Stomach/Bowel: The stomach, small bowel and large bowel are normal in caliber without abnormal wall thickening or surrounding inflammatory changes. Reproductive: Status post hysterectomy. No adnexal masses. Lymphatic: No enlarged lymph nodes in the abdomen or pelvis. Vasculature: The abdominal aorta is normal in caliber. Advanced atherosclerotic disease of the aorta. Other: No abdominopelvic ascites. Musculoskeletal: No aggressive osseous lesions. Advanced degenerative changes at L5-S1. The soft tissues are unremarkable. IMPRESSION: There is a 2 mm calcification which appears to correspond to a partially obstructive stone impacted in the proximal right ureter. No significant upstream dilation or hydronephrosis. Electronically Signed   By: Albin Felling M.D.   On: 10/28/2021 10:49    Scheduled  Inpatient Medications:    amLODipine  10 mg Oral Daily   insulin aspart  0-5 Units Subcutaneous QHS   insulin aspart  0-9 Units Subcutaneous TID WC   multivitamin with minerals  1 tablet Oral Daily   pantoprazole (PROTONIX) IV  40 mg Intravenous Q12H    Continuous Inpatient Infusions:    sodium bicarbonate 150 mEq in D5W infusion 125 mL/hr at 10/29/21 0637    PRN Inpatient Medications:  acetaminophen, hydrALAZINE, ondansetron (ZOFRAN) IV   Assessment:  Reported Melena - Dark stools suspected secondary to consumption of Pepto Bismol. She reports tarry stool last night. Continue to rule out other etiology for UGI bleed to include DDX : Peptic ulcer, erosive gastritis, arteriovenous malformation.  2.  Vomiting - Suspected r/t uremia. Possible acute gastroenteritis. No vomiting since admission on clear liquid diet.  No nausea or abdominal pain or tenderness.  3.  Diarrhea - Improving. Acute GI etiology to  include viral or bacterial gastroenteritis, C diff, lymphocytic colitis flare.  4.  AKI- Uremia persists and improvement in acidosis. She reports feeling better, stronger and less malaise.  5.  Dysphagia- hx of intermittent solid food mid sternal dysphagia without impactions.  Await UGI for evaluation of esophageal stricture.  Plan:  Monitor serial H/H and transfuse if Hgb <7.0. Monitor for overt GI bleeding.  Continue Pantoprazole 40 mg IV every 12 hours for gastric protection. Await UGI study results Collect stool for PCR and C diff if she has further diarrhea 4.   Appreciate Nephrology assistance.  5.   Further GI recommendations pending UGI findings.   Gershon Mussel, ANP @[facility] @, 7:51 AM

## 2021-10-29 NOTE — TOC Initial Note (Signed)
Transition of Care [facility]) - Initial/Assessment Note    Patient Details  Name: Patricia Cross MRN: [id number] Date of Birth: September 18, 1946  Transition of Care [facility]) CM/SW Contact:    Beverly Sessions, RN Phone Number: 10/29/2021, 10:33 AM  Clinical Narrative:                  Transition of Care [facility]) Screening Note   Patient Details  Name: Patricia Cross Date of Birth: 1946-03-31   Transition of Care [facility]) CM/SW Contact:    Beverly Sessions, RN Phone Number: 10/29/2021, 10:33 AM    Transition of Care Department [facility]) has reviewed patient and no TOC needs have been identified at this time. We will continue to monitor patient advancement through interdisciplinary progression rounds. If new patient transition needs arise, please place a TOC consult.          Patient Goals and CMS Choice        Expected Discharge Plan and Services                                                Prior Living Arrangements/Services                       Activities of Daily Living Home Assistive Devices/Equipment: None ADL Screening (condition at time of admission) Patient's cognitive ability adequate to safely complete daily activities?: Yes Is the patient deaf or have difficulty hearing?: No Does the patient have difficulty seeing, even when wearing glasses/contacts?: No Does the patient have difficulty concentrating, remembering, or making decisions?: No Patient able to express need for assistance with ADLs?: Yes Does the patient have difficulty dressing or bathing?: No Independently performs ADLs?: Yes (appropriate for developmental age) Does the patient have difficulty walking or climbing stairs?: No Weakness of Legs: None Weakness of Arms/Hands: None  Permission Sought/Granted                  Emotional Assessment              Admission diagnosis:  GI bleeding [K92.2] Upper GI bleed [K92.2] AKI (acute kidney injury) ([place])  [N17.9] Acute renal failure, unspecified acute renal failure type ([place]) [N17.9] Gastrointestinal hemorrhage, unspecified gastrointestinal hemorrhage type [K92.2] Patient Active Problem List   Diagnosis Date Noted   GI bleeding 10/28/2021   HLD (hyperlipidemia) 10/28/2021   Hypertension    Type II diabetes mellitus with renal manifestations ([place])    AKI (acute kidney injury) ([place])    Hyperkalemia    Right ureteral stone    Leukocytosis    Nausea vomiting and diarrhea    Normocytic anemia    Rectal leakage 02/04/2016   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

## 2021-10-29 NOTE — Progress Notes (Signed)
16 Fr Foley placed per order with Jonna Clark as second assist

## 2021-10-29 NOTE — Progress Notes (Signed)
GI update note: Upper gi study results: No signs of ulcer but does hve an esophageal narrowing. Recommend egd with renal improvement and potentially/likely as outpatient  Volney American, [facility]

## 2021-10-29 NOTE — Progress Notes (Signed)
PROGRESS NOTE    SIRIYAH AMBROSIUS  VHQ:[id number] DOB: Feb 11, 1946 DOA: 10/28/2021 PCP: Perrin Maltese, MD    Brief Narrative:   Patricia Cross is a 76 y.o. female with medical history significant of lymphocytic colitis, hypertension, hyperlipidemia, diabetes mellitus, GERD, anemia, who presents with black stool, nausea, vomiting, diarrhea. Hemoglobin has been stable, but patient was found to have acute kidney injury with creatinine of 6.93, bicarb less than 7. Patient was followed by nephrology, started on bicarb drip.   Assessment & Plan:   Principal Problem:   GI bleeding Active Problems:   Hypertension   Type II diabetes mellitus with renal manifestations (HCC)   AKI (acute kidney injury) ([place])   Hyperkalemia   Right ureteral stone   Leukocytosis   Nausea vomiting and diarrhea   Normocytic anemia   HLD (hyperlipidemia)  Acute kidney injury secondary to ATN. Severe metabolic acidosis. Hyperkalemia. Right side kidney stone. Reviewed the previous labs, patient has normal renal function tested anywhere in last 3 years. Patient was taking blood pressure medicine and diuretics.  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 There is a dehydration component to the renal failure. Has been followed by nephrology, continue IV fluids.  It appears that metabolic acidosis has improved, will change fluids to  lactated ringer. Continue monitor renal function. CT scan of abdomen/pelvis also noted a 2 mm of kidney stone at proximal ureter.  Urology consult has been obtained.  Uncontrolled type 2 diabetes with hyperglycemia Will discontinue all oral medic medicine including metformin.  Continue sliding scale insulin, added Levemir and scheduled NovoLog.  Melena. Anemia. Patient was also taking Pepto-Bismol.  Not sure she has GI bleed.  We will check a stool for occult blood. Monitor hemoglobin, check iron B12 level. Patient is also  being followed by GI, ordered upper GI series.  We will follow the results. Patient also placed on Protonix IV twice a day.   DVT prophylaxis: SCDs Code Status: full Family Communication:  Disposition Plan:    Status is: Inpatient  Remains inpatient appropriate because: Disease, IV treatment.     I/O last 3 completed shifts: In: 1162.2 [I.V.:1162.2] Out: 0  No intake/output data recorded.     Consultants:  GI, nephrology.  Procedures: none  Antimicrobials: none  Subjective: Patient feels better today, denies any nausea vomiting abdominal pain.  No diarrhea.  Last bowel movement was earlier this morning, still tarry. Denies any short of breath or cough. No fever chills  No dysuria or hematuria.  Objective: Vitals:   10/28/21 1809 10/28/21 2109 10/29/21 0335 10/29/21 0814  BP: (!) 94/36 (!) 98/40 (!) 105/44 (!) 117/46  Pulse: 91 84 85 84  Resp: 18 20 20 16   Temp: 98.6 F (37 C) 98.8 F (37.1 C) 99 F (37.2 C) 98.6 F (37 C)  TempSrc:  Oral Oral Oral  SpO2: 100% 93% 97% 99%  Weight:      Height:        Intake/Output Summary (Last 24 hours) at 10/29/2021 1017 Last data filed at 10/29/2021 4132 Gross per 24 hour  Intake 1162.18 ml  Output 0 ml  Net 1162.18 ml   Filed Weights   10/28/21 0914  Weight: 53.5 kg    Examination:  General exam: Appears calm and comfortable  Respiratory system: Clear to auscultation. Respiratory effort normal. Cardiovascular system: S1 & S2 heard, RRR. No JVD, murmurs, rubs, gallops or clicks. No pedal edema. Gastrointestinal system:  Abdomen is nondistended, soft and nontender. No organomegaly or masses felt. Normal bowel sounds heard. Central nervous system: Alert and oriented. No focal neurological deficits. Extremities: Symmetric 5 x 5 power. Skin: No rashes, lesions or ulcers Psychiatry: Judgement and insight appear normal. Mood & affect appropriate.     Data Reviewed: I have personally reviewed following labs and  imaging studies  CBC: Recent Labs  Lab 10/28/21 0915 10/28/21 1226 10/28/21 1816 10/29/21 0457  WBC 14.4* 13.1* 13.3* 12.1*  HGB 10.1* 9.3* 9.3* 9.7*  HCT 31.0* 28.0* 26.9* 27.5*  MCV 92.3 90.9 86.8 83.8  PLT 300 240 245 448   Basic Metabolic Panel: Recent Labs  Lab 10/28/21 0915 10/28/21 1816 10/29/21 0457  NA 137  --  139  K 5.3* 4.6 3.7  CL 94*  --  95*  CO2 <7*  --  25  GLUCOSE 271*  --  243*  BUN 99*  --  102*  CREATININE 6.93*  --  6.65*  CALCIUM 8.8*  --  7.6*   GFR: Estimated Creatinine Clearance: 5.9 mL/min (A) (by C-G formula based on SCr of 6.65 mg/dL (H)). Liver Function Tests: Recent Labs  Lab 10/28/21 0915  AST 44*  ALT 24  ALKPHOS 53  BILITOT 0.8  PROT 7.0  ALBUMIN 3.7   No results for input(s): LIPASE, AMYLASE in the last 168 hours. No results for input(s): AMMONIA in the last 168 hours. Coagulation Profile: Recent Labs  Lab 10/28/21 0915  INR 1.4*   Cardiac Enzymes: No results for input(s): CKTOTAL, CKMB, CKMBINDEX, TROPONINI in the last 168 hours. BNP (last 3 results) No results for input(s): PROBNP in the last 8760 hours. HbA1C: Recent Labs    10/28/21 1816  HGBA1C 8.2*   CBG: Recent Labs  Lab 10/28/21 1630 10/28/21 2051 10/28/21 2146 10/29/21 0816  GLUCAP 287* 268* 301* 206*   Lipid Profile: No results for input(s): CHOL, HDL, LDLCALC, TRIG, CHOLHDL, LDLDIRECT in the last 72 hours. Thyroid Function Tests: No results for input(s): TSH, T4TOTAL, FREET4, T3FREE, THYROIDAB in the last 72 hours. Anemia Panel: No results for input(s): VITAMINB12, FOLATE, FERRITIN, TIBC, IRON, RETICCTPCT in the last 72 hours. Sepsis Labs: No results for input(s): PROCALCITON, LATICACIDVEN in the last 168 hours.  Recent Results (from the past 240 hour(s))  Resp Panel by RT-PCR (Flu A&B, Covid) Nasopharyngeal Swab     Status: None   Collection Time: 10/28/21  9:17 AM   Specimen: Nasopharyngeal Swab; Nasopharyngeal(NP) swabs in vial transport  medium  Result Value Ref Range Status   SARS Coronavirus 2 by RT PCR NEGATIVE NEGATIVE Final    Comment: (NOTE) SARS-CoV-2 target nucleic acids are NOT DETECTED.  The SARS-CoV-2 RNA is generally detectable in upper respiratory specimens during the acute phase of infection. The lowest concentration of SARS-CoV-2 viral copies this assay can detect is 138 copies/mL. A negative result does not preclude SARS-Cov-2 infection and should not be used as the sole basis for treatment or other patient management decisions. A negative result may occur with  improper specimen collection/handling, submission of specimen other than nasopharyngeal swab, presence of viral mutation(s) within the areas targeted by this assay, and inadequate number of viral copies(<138 copies/mL). A negative result must be combined with clinical observations, patient history, and epidemiological information. The expected result is Negative.  Fact Sheet for Patients:  EntrepreneurPulse.com.au  Fact Sheet for Healthcare Providers:  IncredibleEmployment.be  This test is no t yet approved or cleared by the [place] FDA and  has  been authorized for detection and/or diagnosis of SARS-CoV-2 by FDA under an Emergency Use Authorization (EUA). This EUA will remain  in effect (meaning this test can be used) for the duration of the COVID-19 declaration under Section 564(b)(1) of the Act, 21 U.S.C.section 360bbb-3(b)(1), unless the authorization is terminated  or revoked sooner.       Influenza A by PCR NEGATIVE NEGATIVE Final   Influenza B by PCR NEGATIVE NEGATIVE Final    Comment: (NOTE) The Xpert Xpress SARS-CoV-2/FLU/RSV plus assay is intended as an aid in the diagnosis of influenza from Nasopharyngeal swab specimens and should not be used as a sole basis for treatment. Nasal washings and aspirates are unacceptable for Xpert Xpress SARS-CoV-2/FLU/RSV testing.  Fact Sheet for  Patients: EntrepreneurPulse.com.au  Fact Sheet for Healthcare Providers: IncredibleEmployment.be  This test is not yet approved or cleared by the [place] FDA and has been authorized for detection and/or diagnosis of SARS-CoV-2 by FDA under an Emergency Use Authorization (EUA). This EUA will remain in effect (meaning this test can be used) for the duration of the COVID-19 declaration under Section 564(b)(1) of the Act, 21 U.S.C. section 360bbb-3(b)(1), unless the authorization is terminated or revoked.  Performed at [facility], [address] [place], [place] [address]          Radiology Studies: CT ABDOMEN PELVIS WO CONTRAST  Result Date: 10/28/2021 CLINICAL DATA:  Nausea/vomiting RLQ abdominal pain (Age >= 14y) EXAM: CT ABDOMEN AND PELVIS WITHOUT CONTRAST TECHNIQUE: Multidetector CT imaging of the abdomen and pelvis was performed following the standard protocol without IV contrast. RADIATION DOSE REDUCTION: This exam was performed according to the departmental dose-optimization program which includes automated exposure control, adjustment of the mA and/or kV according to patient size and/or use of iterative reconstruction technique. COMPARISON:  None. FINDINGS: Inferior chest: The lung bases are well-aerated. Hepatobiliary: The liver is normal in size without focal abnormality. No intrahepatic or extrahepatic biliary ductal dilation. The gallbladder appears normal. Spleen: Normal in size without focal abnormality. Pancreas: No pancreatic ductal dilatation or surrounding inflammatory changes. Adrenals/Urinary Tract: Adrenal glands are unremarkable. The kidneys are normal in size. No hydronephrosis. There is a 2 mm calcification which appears to correspond to a partially obstructive stone impacted in the proximal right ureter. Bladder is unremarkable. Stomach/Bowel: The stomach, small bowel and large bowel are normal in caliber without  abnormal wall thickening or surrounding inflammatory changes. Reproductive: Status post hysterectomy. No adnexal masses. Lymphatic: No enlarged lymph nodes in the abdomen or pelvis. Vasculature: The abdominal aorta is normal in caliber. Advanced atherosclerotic disease of the aorta. Other: No abdominopelvic ascites. Musculoskeletal: No aggressive osseous lesions. Advanced degenerative changes at L5-S1. The soft tissues are unremarkable. IMPRESSION: There is a 2 mm calcification which appears to correspond to a partially obstructive stone impacted in the proximal right ureter. No significant upstream dilation or hydronephrosis. Electronically Signed   By: Albin Felling M.D.   On: 10/28/2021 10:49        Scheduled Meds:  amLODipine  10 mg Oral Daily   insulin aspart  0-5 Units Subcutaneous QHS   insulin aspart  0-9 Units Subcutaneous TID WC   insulin aspart  4 Units Subcutaneous TID WC   insulin detemir  12 Units Subcutaneous Daily   multivitamin with minerals  1 tablet Oral Daily   pantoprazole (PROTONIX) IV  40 mg Intravenous Q12H   Continuous Infusions:  sodium bicarbonate 150 mEq in D5W infusion 125 mL/hr at 10/29/21 [phone]  LOS: 1 day    Time spent: 32 minutes    Sharen Hones, MD [facility]   To contact the attending provider between 7A-7P or the covering provider during after hours 7P-7A, please log into the web site [email or web address] and access using universal [place] password for that web site. If you do not have the password, please call the hospital operator.  10/29/2021, 10:17 AM

## 2021-10-29 NOTE — Progress Notes (Addendum)
Inpatient Diabetes Program Recommendations  AACE/ADA: New Consensus Statement on Inpatient Glycemic Control (2015)  Target Ranges:  Prepandial:   less than 140 mg/dL      Peak postprandial:   less than 180 mg/dL (1-2 hours)      Critically ill patients:  140 - 180 mg/dL    Latest Reference Range & Units 10/28/21 16:30 10/28/21 20:51 10/28/21 21:46  Glucose-Capillary 70 - 99 mg/dL 287 (H)  5 units Novolog  268 (H)  3 units Novolog  301 (H)    Latest Reference Range & Units 10/29/21 04:57  Glucose 70 - 99 mg/dL 243 (H)    Admit with: GI bleeding and nausea vomiting and diarrhea with black stool/ Acute Kidney Injury  History: DM  Home DM Meds: Glipizide 10 mg BID        Januvia 100 mg daily        Metformin 1000 mg BID        Onglyza 5 mg daily (NOT taking)  Current Orders: Novolog Sensitive Correction Scale/ SSI (0-9 units) TID AC + HS    MD- Note lab glucose 243 this AM  Oral DM meds are on hold  Please consider adding low dose basal insulin: Semglee 5 units Daily (0.1 units/kg based on weight of 53 kg)     --Will follow patient during hospitalization--  Wyn Quaker RN, MSN, CDE Diabetes Coordinator Inpatient Glycemic Control Team Team Pager: [phone] (8a-5p)

## 2021-10-29 NOTE — Progress Notes (Signed)
[facility]  ROUNDING NOTE   Subjective:   Patient resting comfortable  Alert and oriented Currently NPO for possible procedure Tolerated liquid diet last night  Objective:  Vital signs in last 24 hours:  Temp:  [98.6 F (37 C)-99 F (37.2 C)] 98.6 F (37 C) (01/13 0814) Pulse Rate:  [84-93] 84 (01/13 0814) Resp:  [16-20] 16 (01/13 0814) BP: (94-117)/(36-50) 117/46 (01/13 0814) SpO2:  [93 %-100 %] 99 % (01/13 0814)  Weight change:  Filed Weights   10/28/21 0914  Weight: 53.5 kg    Intake/Output: I/O last 3 completed shifts: In: 1162.2 [I.V.:1162.2] Out: 0    Intake/Output this shift:  No intake/output data recorded.  Physical Exam: General: NAD, resting in bed  Head: Normocephalic, atraumatic. Moist oral mucosal membranes  Eyes: Anicteric  Lungs:  Clear to auscultation, normal effort  Heart: Regular rate and rhythm  Abdomen:  Soft, nontender  Extremities:  no peripheral edema.  Neurologic: Nonfocal, moving all four extremities  Skin: No lesions       Basic Metabolic Panel: Recent Labs  Lab 10/28/21 0915 10/28/21 1816 10/29/21 0457  NA 137  --  139  K 5.3* 4.6 3.7  CL 94*  --  95*  CO2 <7*  --  25  GLUCOSE 271*  --  243*  BUN 99*  --  102*  CREATININE 6.93*  --  6.65*  CALCIUM 8.8*  --  7.6*    Liver Function Tests: Recent Labs  Lab 10/28/21 0915  AST 44*  ALT 24  ALKPHOS 53  BILITOT 0.8  PROT 7.0  ALBUMIN 3.7   No results for input(s): LIPASE, AMYLASE in the last 168 hours. No results for input(s): AMMONIA in the last 168 hours.  CBC: Recent Labs  Lab 10/28/21 0915 10/28/21 1226 10/28/21 1816 10/29/21 0457  WBC 14.4* 13.1* 13.3* 12.1*  HGB 10.1* 9.3* 9.3* 9.7*  HCT 31.0* 28.0* 26.9* 27.5*  MCV 92.3 90.9 86.8 83.8  PLT 300 240 245 239    Cardiac Enzymes: No results for input(s): CKTOTAL, CKMB, CKMBINDEX, TROPONINI in the last 168 hours.  BNP: Invalid input(s): POCBNP  CBG: Recent Labs  Lab 10/28/21 1630  10/28/21 2051 10/28/21 2146 10/29/21 0816  GLUCAP 287* 268* 301* 206*    Microbiology: Results for orders placed or performed during the hospital encounter of 10/28/21  Resp Panel by RT-PCR (Flu A&B, Covid) Nasopharyngeal Swab     Status: None   Collection Time: 10/28/21  9:17 AM   Specimen: Nasopharyngeal Swab; Nasopharyngeal(NP) swabs in vial transport medium  Result Value Ref Range Status   SARS Coronavirus 2 by RT PCR NEGATIVE NEGATIVE Final    Comment: (NOTE) SARS-CoV-2 target nucleic acids are NOT DETECTED.  The SARS-CoV-2 RNA is generally detectable in upper respiratory specimens during the acute phase of infection. The lowest concentration of SARS-CoV-2 viral copies this assay can detect is 138 copies/mL. A negative result does not preclude SARS-Cov-2 infection and should not be used as the sole basis for treatment or other patient management decisions. A negative result may occur with  improper specimen collection/handling, submission of specimen other than nasopharyngeal swab, presence of viral mutation(s) within the areas targeted by this assay, and inadequate number of viral copies(<138 copies/mL). A negative result must be combined with clinical observations, patient history, and epidemiological information. The expected result is Negative.  Fact Sheet for Patients:  EntrepreneurPulse.com.au  Fact Sheet for Healthcare Providers:  IncredibleEmployment.be  This test is no t yet approved  or cleared by the [place] and  has been authorized for detection and/or diagnosis of SARS-CoV-2 by FDA under an Emergency Use Authorization (EUA). This EUA will remain  in effect (meaning this test can be used) for the duration of the COVID-19 declaration under Section 564(b)(1) of the Act, 21 U.S.C.section 360bbb-3(b)(1), unless the authorization is terminated  or revoked sooner.       Influenza A by PCR NEGATIVE NEGATIVE Final    Influenza B by PCR NEGATIVE NEGATIVE Final    Comment: (NOTE) The Xpert Xpress SARS-CoV-2/FLU/RSV plus assay is intended as an aid in the diagnosis of influenza from Nasopharyngeal swab specimens and should not be used as a sole basis for treatment. Nasal washings and aspirates are unacceptable for Xpert Xpress SARS-CoV-2/FLU/RSV testing.  Fact Sheet for Patients: EntrepreneurPulse.com.au  Fact Sheet for Healthcare Providers: IncredibleEmployment.be  This test is not yet approved or cleared by the [place] FDA and has been authorized for detection and/or diagnosis of SARS-CoV-2 by FDA under an Emergency Use Authorization (EUA). This EUA will remain in effect (meaning this test can be used) for the duration of the COVID-19 declaration under Section 564(b)(1) of the Act, 21 U.S.C. section 360bbb-3(b)(1), unless the authorization is terminated or revoked.  Performed at [facility], [address] [place], [place] [address]     Coagulation Studies: Recent Labs    10/28/21 0915  LABPROT 16.8*  INR 1.4*    Urinalysis: No results for input(s): COLORURINE, LABSPEC, PHURINE, GLUCOSEU, HGBUR, BILIRUBINUR, KETONESUR, PROTEINUR, UROBILINOGEN, NITRITE, LEUKOCYTESUR in the last 72 hours.  Invalid input(s): APPERANCEUR    Imaging: CT ABDOMEN PELVIS WO CONTRAST  Result Date: 10/28/2021 CLINICAL DATA:  Nausea/vomiting RLQ abdominal pain (Age >= 14y) EXAM: CT ABDOMEN AND PELVIS WITHOUT CONTRAST TECHNIQUE: Multidetector CT imaging of the abdomen and pelvis was performed following the standard protocol without IV contrast. RADIATION DOSE REDUCTION: This exam was performed according to the departmental dose-optimization program which includes automated exposure control, adjustment of the mA and/or kV according to patient size and/or use of iterative reconstruction technique. COMPARISON:  None. FINDINGS: Inferior chest: The lung bases are  well-aerated. Hepatobiliary: The liver is normal in size without focal abnormality. No intrahepatic or extrahepatic biliary ductal dilation. The gallbladder appears normal. Spleen: Normal in size without focal abnormality. Pancreas: No pancreatic ductal dilatation or surrounding inflammatory changes. Adrenals/Urinary Tract: Adrenal glands are unremarkable. The kidneys are normal in size. No hydronephrosis. There is a 2 mm calcification which appears to correspond to a partially obstructive stone impacted in the proximal right ureter. Bladder is unremarkable. Stomach/Bowel: The stomach, small bowel and large bowel are normal in caliber without abnormal wall thickening or surrounding inflammatory changes. Reproductive: Status post hysterectomy. No adnexal masses. Lymphatic: No enlarged lymph nodes in the abdomen or pelvis. Vasculature: The abdominal aorta is normal in caliber. Advanced atherosclerotic disease of the aorta. Other: No abdominopelvic ascites. Musculoskeletal: No aggressive osseous lesions. Advanced degenerative changes at L5-S1. The soft tissues are unremarkable. IMPRESSION: There is a 2 mm calcification which appears to correspond to a partially obstructive stone impacted in the proximal right ureter. No significant upstream dilation or hydronephrosis. Electronically Signed   By: Albin Felling M.D.   On: 10/28/2021 10:49     Medications:    lactated ringers 125 mL/hr at 10/29/21 1059    amLODipine  10 mg Oral Daily   insulin aspart  0-5 Units Subcutaneous QHS   insulin aspart  0-9 Units Subcutaneous TID WC  insulin aspart  4 Units Subcutaneous TID WC   insulin detemir  12 Units Subcutaneous Daily   multivitamin with minerals  1 tablet Oral Daily   pantoprazole (PROTONIX) IV  40 mg Intravenous Q12H   acetaminophen, hydrALAZINE, ondansetron (ZOFRAN) IV  Assessment/ Plan:  Patricia Cross is a 76 y.o.  female  with past medical history of diabetes, GERD, anemia, hypertension,  lymphocytic colitis, and hyperlipidemia, who was admitted to [facility] on 10/28/2021 for GI bleeding [K92.2] Upper GI bleed [K92.2] AKI (acute kidney injury) ([place]) [N17.9] Acute renal failure, unspecified acute renal failure type ([place]) [N17.9] Gastrointestinal hemorrhage, unspecified gastrointestinal hemorrhage type [K92.2]   Acute kidney injury Baseline unknown.  Likely secondary to severe dehydration.  CT abdomen pelvis shows nonobstructive renal stone.  Losartan, HCTZ and metformin currently held.  No IV contrast exposure.  No acute indication for dialysis at this time but monitoring closely.  - Creatinine slightly improved. No urine output recorded.  -  Will continue IVF at this time  - If renal function remains decreased, would consider Urology consult for renal stone seen on CT.  - Requested Foley insertion  Lab Results  Component Value Date   CREATININE 6.65 (H) 10/29/2021   CREATININE 6.93 (H) 10/28/2021    Intake/Output Summary (Last 24 hours) at 10/29/2021 1105 Last data filed at 10/29/2021 0637 Gross per 24 hour  Intake 1162.18 ml  Output 0 ml  Net 1162.18 ml   2.  Hyperkalemia due to kidney injury versus diarrhea.  Potassium on admission 5.3. Improved to 3.7 today.    3.  Metabolic acidosis.  resolved with sodium bicarb infusion. Will change IVF to normal saline.    4.  Hypertension of unspecified cause.  Home regimen includes amlodipine, HCTZ and losartan.  Losartan currently held in setting of kidney injury.  BP currently 117/46    LOS: 1 Anthem Frazer 1/13/202311:05 AM

## 2021-10-30 ENCOUNTER — Inpatient Hospital Stay: Payer: Medicare Other

## 2021-10-30 LAB — C DIFFICILE QUICK SCREEN W PCR REFLEX
C Diff antigen: POSITIVE — AB
C Diff toxin: NEGATIVE

## 2021-10-30 LAB — CBC
HCT: 27.1 % — ABNORMAL LOW (ref 36.0–46.0)
HCT: 28.9 % — ABNORMAL LOW (ref 36.0–46.0)
Hemoglobin: 9.7 g/dL — ABNORMAL LOW (ref 12.0–15.0)
Hemoglobin: 10.2 g/dL — ABNORMAL LOW (ref 12.0–15.0)
MCH: 29.6 pg (ref 26.0–34.0)
MCH: 30.1 pg (ref 26.0–34.0)
MCHC: 35.8 g/dL (ref 30.0–36.0)
MCHC: 35.3 g/dL (ref 30.0–36.0)
MCV: 82.6 fL (ref 80.0–100.0)
MCV: 85.3 fL (ref 80.0–100.0)
Platelets: 196 10*3/uL (ref 150–400)
Platelets: 194 10*3/uL (ref 150–400)
RBC: 3.28 MIL/uL — ABNORMAL LOW (ref 3.87–5.11)
RBC: 3.39 MIL/uL — ABNORMAL LOW (ref 3.87–5.11)
RDW: 18.1 % — ABNORMAL HIGH (ref 11.5–15.5)
RDW: 18 % — ABNORMAL HIGH (ref 11.5–15.5)
WBC: 5.7 10*3/uL (ref 4.0–10.5)
WBC: 6 10*3/uL (ref 4.0–10.5)
nRBC: 0 % (ref 0.0–0.2)
nRBC: 0 % (ref 0.0–0.2)

## 2021-10-30 LAB — BASIC METABOLIC PANEL
Anion gap: 11 (ref 5–15)
BUN: 92 mg/dL — ABNORMAL HIGH (ref 8–23)
CO2: 33 mmol/L — ABNORMAL HIGH (ref 22–32)
Calcium: 7.8 mg/dL — ABNORMAL LOW (ref 8.9–10.3)
Chloride: 96 mmol/L — ABNORMAL LOW (ref 98–111)
Creatinine, Ser: 5.83 mg/dL — ABNORMAL HIGH (ref 0.44–1.00)
GFR, Estimated: 7 mL/min — ABNORMAL LOW (ref >60)
Glucose, Bld: 121 mg/dL — ABNORMAL HIGH (ref 70–99)
Potassium: 3.4 mmol/L — ABNORMAL LOW (ref 3.5–5.1)
Sodium: 140 mmol/L (ref 135–145)

## 2021-10-30 LAB — GLUCOSE, CAPILLARY
Glucose-Capillary: 132 mg/dL — ABNORMAL HIGH (ref 70–99)
Glucose-Capillary: 400 mg/dL — ABNORMAL HIGH (ref 70–99)
Glucose-Capillary: 270 mg/dL — ABNORMAL HIGH (ref 70–99)
Glucose-Capillary: 204 mg/dL — ABNORMAL HIGH (ref 70–99)

## 2021-10-30 LAB — GASTROINTESTINAL PANEL BY PCR, STOOL (REPLACES STOOL CULTURE)

## 2021-10-30 LAB — C4 COMPLEMENT: Complement C4, Body Fluid: 28 mg/dL (ref 12–38)

## 2021-10-30 LAB — URINALYSIS, COMPLETE (UACMP) WITH MICROSCOPIC
Bacteria, UA: NONE SEEN
Bilirubin Urine: NEGATIVE
Glucose, UA: NEGATIVE mg/dL
Ketones, ur: NEGATIVE mg/dL
Nitrite: NEGATIVE
Protein, ur: NEGATIVE mg/dL
Specific Gravity, Urine: 1.015 (ref 1.005–1.030)
pH: 8 (ref 5.0–8.0)

## 2021-10-30 LAB — C3 COMPLEMENT: C3 Complement: 83 mg/dL (ref 82–167)

## 2021-10-30 LAB — CLOSTRIDIUM DIFFICILE BY PCR, REFLEXED: Toxigenic C. Difficile by PCR: NEGATIVE

## 2021-10-30 LAB — ANA W/REFLEX: Anti Nuclear Antibody (ANA): NEGATIVE

## 2021-10-30 MED ORDER — INSULIN ASPART 100 UNIT/ML IJ SOLN
4.0000 [IU] | Freq: Three times a day (TID) | INTRAMUSCULAR | Status: DC
  Administered 2021-10-31 – 2021-11-03 (×11): 4 [IU] via SUBCUTANEOUS
  Filled 2021-10-30 (×10): qty 1

## 2021-10-30 MED ORDER — POTASSIUM CHLORIDE 20 MEQ PO PACK
40.0000 meq | PACK | Freq: Once | ORAL | Status: AC
Start: 2021-10-30 — End: 2021-10-30
  Administered 2021-10-30: 40 meq via ORAL
  Filled 2021-10-30: qty 2

## 2021-10-30 MED ORDER — VANCOMYCIN HCL 125 MG PO CAPS
125.0000 mg | ORAL_CAPSULE | Freq: Four times a day (QID) | ORAL | Status: DC
  Administered 2021-10-30 – 2021-11-03 (×16): 125 mg via ORAL
  Filled 2021-10-30 (×19): qty 1

## 2021-10-30 MED ORDER — SODIUM CHLORIDE 0.9 % IV SOLN: INTRAVENOUS | Status: DC

## 2021-10-30 NOTE — Progress Notes (Signed)
PROGRESS NOTE    Patricia Cross  MCN:[id number] DOB: 02-Jan-1946 DOA: 10/28/2021 PCP: Perrin Maltese, MD    Brief Narrative:  Patricia Cross is a 76 y.o. female with medical history significant of lymphocytic colitis, hypertension, hyperlipidemia, diabetes mellitus, GERD, anemia, who presents with black stool, nausea, vomiting, diarrhea. Hemoglobin has been stable, but patient was found to have acute kidney injury with creatinine of 6.93, bicarb less than 7. Patient was followed by nephrology, started on bicarb drip.   Assessment & Plan:   Principal Problem:   GI bleeding Active Problems:   Hypertension   Type II diabetes mellitus with renal manifestations (HCC)   AKI (acute kidney injury) ([place])   Hyperkalemia   Right ureteral stone   Leukocytosis   Nausea vomiting and diarrhea   Normocytic anemia   HLD (hyperlipidemia)  Acute kidney injury secondary to ATN. Severe metabolic acidosis. Hyperkalemia. Right side kidney stone. Patient renal function has been improving, potassium has normalized.  I will continue lactated Ringer recheck renal function tomorrow. Patient also has a 2 mm kidney stone, recheck renal ultrasound to see if there is significant obstruction.  If not, patient can be followed by urology as outpatient.  Uncontrolled type 2 diabetes with hyperglycemia Patient developed hypoglycemia after giving insulin.  For now, I will hold off scheduled insulin,   Melena. Anemia. GI showed some esophageal stenosis, no evidence of gastric ulcer.  Followed by GI, consider EGD when renal function improves or as outpatient. Patient has adequate iron and B12 stores.     DVT prophylaxis: SCDs Code Status: full Family Communication:  Disposition Plan:      Status is: Inpatient   Remains inpatient appropriate because: Disease, IV treatment.        I/O last 3 completed shifts: In: 3542.8 [P.O.:360; I.V.:3182.8] Out: 2450 [Urine:2450] Total I/O In: -  Out:  1000 [Urine:1000]     Consultants:  Nephrology and GI.  Procedures: None  Antimicrobials: None Subjective: Patient doing well, tolerating IV fluids.  No short of breath or cough. Denies any abdominal pain or nausea vomiting, no additional black stool. No fever chills    Objective: Vitals:   10/29/21 2025 10/30/21 0445 10/30/21 0616 10/30/21 0737  BP: (!) 124/48 118/60  (!) 106/58  Pulse: 89 77  83  Resp: 16 16  18   Temp: 98.2 F (36.8 C) 98.8 F (37.1 C)  98.6 F (37 C)  TempSrc:  Oral    SpO2: 100% 98%  94%  Weight:   60.7 kg   Height:        Intake/Output Summary (Last 24 hours) at 10/30/2021 1058 Last data filed at 10/30/2021 0900 Gross per 24 hour  Intake 2380.65 ml  Output 3450 ml  Net -1069.35 ml   Filed Weights   10/28/21 0914 10/30/21 0616  Weight: 53.5 kg 60.7 kg    Examination:  General exam: Appears calm and comfortable  Respiratory system: Clear to auscultation. Respiratory effort normal. Cardiovascular system: S1 & S2 heard, RRR. No JVD, murmurs, rubs, gallops or clicks. No pedal edema. Gastrointestinal system: Abdomen is nondistended, soft and nontender. No organomegaly or masses felt. Normal bowel sounds heard. Central nervous system: Alert and oriented. No focal neurological deficits. Extremities: Symmetric 5 x 5 power. Skin: No rashes, lesions or ulcers Psychiatry: Judgement and insight appear normal. Mood & affect appropriate.     Data Reviewed: I have personally reviewed following labs and imaging studies  CBC: Recent Labs  Lab 10/28/21 1226 10/28/21  1816 10/29/21 0457 10/29/21 1835 10/30/21 0618  WBC 13.1* 13.3* 12.1* 8.5 5.7  HGB 9.3* 9.3* 9.7* 10.0* 9.7*  HCT 28.0* 26.9* 27.5* 28.0* 27.1*  MCV 90.9 86.8 83.8 83.1 82.6  PLT 240 245 239 227 662   Basic Metabolic Panel: Recent Labs  Lab 10/28/21 0915 10/28/21 1816 10/29/21 0457 10/30/21 0618  NA 137  --  139 140  K 5.3* 4.6 3.7 3.4*  CL 94*  --  95* 96*  CO2 <7*  --   25 33*  GLUCOSE 271*  --  243* 121*  BUN 99*  --  102* 92*  CREATININE 6.93*  --  6.65* 5.83*  CALCIUM 8.8*  --  7.6* 7.8*   GFR: Estimated Creatinine Clearance: 6.7 mL/min (A) (by C-G formula based on SCr of 5.83 mg/dL (H)). Liver Function Tests: Recent Labs  Lab 10/28/21 0915  AST 44*  ALT 24  ALKPHOS 53  BILITOT 0.8  PROT 7.0  ALBUMIN 3.7   No results for input(s): LIPASE, AMYLASE in the last 168 hours. No results for input(s): AMMONIA in the last 168 hours. Coagulation Profile: Recent Labs  Lab 10/28/21 0915  INR 1.4*   Cardiac Enzymes: No results for input(s): CKTOTAL, CKMB, CKMBINDEX, TROPONINI in the last 168 hours. BNP (last 3 results) No results for input(s): PROBNP in the last 8760 hours. HbA1C: Recent Labs    10/28/21 1816  HGBA1C 8.2*   CBG: Recent Labs  Lab 10/29/21 2022 10/29/21 2038 10/29/21 2052 10/29/21 2103 10/30/21 0735  GLUCAP 61* 57* 61* 71 132*   Lipid Profile: No results for input(s): CHOL, HDL, LDLCALC, TRIG, CHOLHDL, LDLDIRECT in the last 72 hours. Thyroid Function Tests: No results for input(s): TSH, T4TOTAL, FREET4, T3FREE, THYROIDAB in the last 72 hours. Anemia Panel: Recent Labs    10/29/21 0457 10/29/21 1104  VITAMINB12  --  525  FERRITIN 153  --   TIBC 293  --   IRON 43  --    Sepsis Labs: No results for input(s): PROCALCITON, LATICACIDVEN in the last 168 hours.  Recent Results (from the past 240 hour(s))  Resp Panel by RT-PCR (Flu A&B, Covid) Nasopharyngeal Swab     Status: None   Collection Time: 10/28/21  9:17 AM   Specimen: Nasopharyngeal Swab; Nasopharyngeal(NP) swabs in vial transport medium  Result Value Ref Range Status   SARS Coronavirus 2 by RT PCR NEGATIVE NEGATIVE Final    Comment: (NOTE) SARS-CoV-2 target nucleic acids are NOT DETECTED.  The SARS-CoV-2 RNA is generally detectable in upper respiratory specimens during the acute phase of infection. The lowest concentration of SARS-CoV-2 viral copies  this assay can detect is 138 copies/mL. A negative result does not preclude SARS-Cov-2 infection and should not be used as the sole basis for treatment or other patient management decisions. A negative result may occur with  improper specimen collection/handling, submission of specimen other than nasopharyngeal swab, presence of viral mutation(s) within the areas targeted by this assay, and inadequate number of viral copies(<138 copies/mL). A negative result must be combined with clinical observations, patient history, and epidemiological information. The expected result is Negative.  Fact Sheet for Patients:  EntrepreneurPulse.com.au  Fact Sheet for Healthcare Providers:  IncredibleEmployment.be  This test is no t yet approved or cleared by the [place] FDA and  has been authorized for detection and/or diagnosis of SARS-CoV-2 by FDA under an Emergency Use Authorization (EUA). This EUA will remain  in effect (meaning this test can be used) for the  duration of the COVID-19 declaration under Section 564(b)(1) of the Act, 21 U.S.C.section 360bbb-3(b)(1), unless the authorization is terminated  or revoked sooner.       Influenza A by PCR NEGATIVE NEGATIVE Final   Influenza B by PCR NEGATIVE NEGATIVE Final    Comment: (NOTE) The Xpert Xpress SARS-CoV-2/FLU/RSV plus assay is intended as an aid in the diagnosis of influenza from Nasopharyngeal swab specimens and should not be used as a sole basis for treatment. Nasal washings and aspirates are unacceptable for Xpert Xpress SARS-CoV-2/FLU/RSV testing.  Fact Sheet for Patients: EntrepreneurPulse.com.au  Fact Sheet for Healthcare Providers: IncredibleEmployment.be  This test is not yet approved or cleared by the [place] FDA and has been authorized for detection and/or diagnosis of SARS-CoV-2 by FDA under an Emergency Use Authorization (EUA). This EUA  will remain in effect (meaning this test can be used) for the duration of the COVID-19 declaration under Section 564(b)(1) of the Act, 21 U.S.C. section 360bbb-3(b)(1), unless the authorization is terminated or revoked.  Performed at [facility], [place], [place] [address]   C Difficile Quick Screen w PCR reflex     Status: Abnormal   Collection Time: 10/30/21  8:52 AM   Specimen: STOOL  Result Value Ref Range Status   C Diff antigen POSITIVE (A) NEGATIVE Final   C Diff toxin NEGATIVE NEGATIVE Final   C Diff interpretation Results are indeterminate. See PCR results.  Final    Comment: Performed at [facility], [address] [place], [place] [address]  Gastrointestinal Panel by PCR , Stool     Status: None   Collection Time: 10/30/21  8:52 AM   Specimen: Stool  Result Value Ref Range Status   Campylobacter species NOT DETECTED NOT DETECTED Final   Plesimonas shigelloides NOT DETECTED NOT DETECTED Final   Salmonella species NOT DETECTED NOT DETECTED Final   Yersinia enterocolitica NOT DETECTED NOT DETECTED Final   Vibrio species NOT DETECTED NOT DETECTED Final   Vibrio cholerae NOT DETECTED NOT DETECTED Final   Enteroaggregative E coli (EAEC) NOT DETECTED NOT DETECTED Final   Enteropathogenic E coli (EPEC) NOT DETECTED NOT DETECTED Final   Enterotoxigenic E coli (ETEC) NOT DETECTED NOT DETECTED Final   Shiga like toxin producing E coli (STEC) NOT DETECTED NOT DETECTED Final   Shigella/Enteroinvasive E coli (EIEC) NOT DETECTED NOT DETECTED Final   Cryptosporidium NOT DETECTED NOT DETECTED Final   Cyclospora cayetanensis NOT DETECTED NOT DETECTED Final   Entamoeba histolytica NOT DETECTED NOT DETECTED Final   Giardia lamblia NOT DETECTED NOT DETECTED Final   Adenovirus F40/41 NOT DETECTED NOT DETECTED Final   Astrovirus NOT DETECTED NOT DETECTED Final   Norovirus GI/GII NOT DETECTED NOT DETECTED Final   Rotavirus A NOT DETECTED NOT DETECTED  Final   Sapovirus (I, II, IV, and V) NOT DETECTED NOT DETECTED Final    Comment: Performed at [facility], [address] [place], [place] [address]  C. Diff by PCR, Reflexed     Status: None   Collection Time: 10/30/21  8:52 AM  Result Value Ref Range Status   Toxigenic C. Difficile by PCR NEGATIVE NEGATIVE Final    Comment: Patient is colonized with non toxigenic C. difficile. May not need treatment unless significant symptoms are present. Performed at [facility], [address], [place], [place] [address]          Radiology Studies: DG UGI W SINGLE CM (SOL OR THIN BA)  Result  Date: 10/29/2021 CLINICAL DATA:  Upper GI bleeding EXAM: UPPER GI SERIES WITHOUT KUB TECHNIQUE: Routine upper GI series was performed with thin/high density/water soluble barium. FLUOROSCOPY TIME:  Fluoroscopy Time:  1.9 cm Radiation Exposure Index (if provided by the fluoroscopic device): 1.9 cm Number of Acquired Spot Images: 0 COMPARISON:  None. FINDINGS: UPPER GI SERIES: Examination of the esophagus demonstrated normal esophageal motility. Normal esophageal morphology without evidence of esophagitis or ulceration. No esophageal diverticula, or mass lesion. Focal narrowing of the distal esophagus at the gastroesophageal junction restricting the passage of a barium tablet consistent with a stricture. No evidence of hiatal hernia. No spontaneous or inducible gastroesophageal reflux. Examination of the stomach demonstrated normal rugal folds and areae gastricae. Gastric mucosa appeared unremarkable without evidence of ulceration, scarring, or mass lesion. Gastric motility and emptying was normal. Fluoroscopic examination of the duodenum demonstrates normal motility and morphology without evidence of ulceration or mass lesion. IMPRESSION: 1. 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 Electronically Signed   By: Kathreen Devoid M.D.   On:  10/29/2021 13:39        Scheduled Meds:  Chlorhexidine Gluconate Cloth  6 each Topical Daily   insulin aspart  0-5 Units Subcutaneous QHS   insulin aspart  0-9 Units Subcutaneous TID WC   multivitamin with minerals  1 tablet Oral Daily   pantoprazole (PROTONIX) IV  40 mg Intravenous Q12H   Continuous Infusions:  sodium chloride       LOS: 2 days    Time spent: 27 minutes    Sharen Hones, MD [facility]   To contact the attending provider between 7A-7P or the covering provider during after hours 7P-7A, please log into the web site [email or web address] and access using universal [place] password for that web site. If you do not have the password, please call the hospital operator.  10/30/2021, 10:58 AM

## 2021-10-30 NOTE — Progress Notes (Signed)
[facility]  ROUNDING NOTE   Subjective:   UOP 2410mL.   Creatinine 5.83 (6.65 )   Potassium replaced this morning.   Patient states she is feeling well this morning.    Objective:  Vital signs in last 24 hours:  Temp:  [98.2 F (36.8 C)-99 F (37.2 C)] 98.6 F (37 C) (01/14 0737) Pulse Rate:  [77-89] 83 (01/14 0737) Resp:  [16-18] 18 (01/14 0737) BP: (106-124)/(48-60) 106/58 (01/14 0737) SpO2:  [94 %-100 %] 94 % (01/14 0737) Weight:  [60.7 kg] 60.7 kg (01/14 0616)  Weight change: 7.176 kg Filed Weights   10/28/21 0914 10/30/21 0616  Weight: 53.5 kg 60.7 kg    Intake/Output: I/O last 3 completed shifts: In: 3542.8 [P.O.:360; I.V.:3182.8] Out: 2450 [Urine:2450]   Intake/Output this shift:  Total I/O In: -  Out: 1000 [Urine:1000]  Physical Exam: General: NAD, laying in bed  Head: Normocephalic, atraumatic. Moist oral mucosal membranes  Eyes: Anicteric  Lungs:  Clear to auscultation, normal effort  Heart: Regular rate and rhythm  Abdomen:  Soft, nontender  Extremities:  no peripheral edema.  Neurologic: Nonfocal, moving all four extremities  Skin: No lesions       Basic Metabolic Panel: Recent Labs  Lab 10/28/21 0915 10/28/21 1816 10/29/21 0457 10/30/21 0618  NA 137  --  139 140  K 5.3* 4.6 3.7 3.4*  CL 94*  --  95* 96*  CO2 <7*  --  25 33*  GLUCOSE 271*  --  243* 121*  BUN 99*  --  102* 92*  CREATININE 6.93*  --  6.65* 5.83*  CALCIUM 8.8*  --  7.6* 7.8*     Liver Function Tests: Recent Labs  Lab 10/28/21 0915  AST 44*  ALT 24  ALKPHOS 53  BILITOT 0.8  PROT 7.0  ALBUMIN 3.7    No results for input(s): LIPASE, AMYLASE in the last 168 hours. No results for input(s): AMMONIA in the last 168 hours.  CBC: Recent Labs  Lab 10/28/21 1226 10/28/21 1816 10/29/21 0457 10/29/21 1835 10/30/21 0618  WBC 13.1* 13.3* 12.1* 8.5 5.7  HGB 9.3* 9.3* 9.7* 10.0* 9.7*  HCT 28.0* 26.9* 27.5* 28.0* 27.1*  MCV 90.9 86.8 83.8 83.1  82.6  PLT 240 245 239 227 196     Cardiac Enzymes: No results for input(s): CKTOTAL, CKMB, CKMBINDEX, TROPONINI in the last 168 hours.  BNP: Invalid input(s): POCBNP  CBG: Recent Labs  Lab 10/29/21 2038 10/29/21 2052 10/29/21 2103 10/30/21 0735 10/30/21 1309  GLUCAP 57* 61* 71 132* 400*     Microbiology: Results for orders placed or performed during the hospital encounter of 10/28/21  Resp Panel by RT-PCR (Flu A&B, Covid) Nasopharyngeal Swab     Status: None   Collection Time: 10/28/21  9:17 AM   Specimen: Nasopharyngeal Swab; Nasopharyngeal(NP) swabs in vial transport medium  Result Value Ref Range Status   SARS Coronavirus 2 by RT PCR NEGATIVE NEGATIVE Final    Comment: (NOTE) SARS-CoV-2 target nucleic acids are NOT DETECTED.  The SARS-CoV-2 RNA is generally detectable in upper respiratory specimens during the acute phase of infection. The lowest concentration of SARS-CoV-2 viral copies this assay can detect is 138 copies/mL. A negative result does not preclude SARS-Cov-2 infection and should not be used as the sole basis for treatment or other patient management decisions. A negative result may occur with  improper specimen collection/handling, submission of specimen other than nasopharyngeal swab, presence of viral mutation(s) within the areas targeted by  this assay, and inadequate number of viral copies(<138 copies/mL). A negative result must be combined with clinical observations, patient history, and epidemiological information. The expected result is Negative.  Fact Sheet for Patients:  EntrepreneurPulse.com.au  Fact Sheet for Healthcare Providers:  IncredibleEmployment.be  This test is no t yet approved or cleared by the [place] FDA and  has been authorized for detection and/or diagnosis of SARS-CoV-2 by FDA under an Emergency Use Authorization (EUA). This EUA will remain  in effect (meaning this test can be  used) for the duration of the COVID-19 declaration under Section 564(b)(1) of the Act, 21 U.S.C.section 360bbb-3(b)(1), unless the authorization is terminated  or revoked sooner.       Influenza A by PCR NEGATIVE NEGATIVE Final   Influenza B by PCR NEGATIVE NEGATIVE Final    Comment: (NOTE) The Xpert Xpress SARS-CoV-2/FLU/RSV plus assay is intended as an aid in the diagnosis of influenza from Nasopharyngeal swab specimens and should not be used as a sole basis for treatment. Nasal washings and aspirates are unacceptable for Xpert Xpress SARS-CoV-2/FLU/RSV testing.  Fact Sheet for Patients: EntrepreneurPulse.com.au  Fact Sheet for Healthcare Providers: IncredibleEmployment.be  This test is not yet approved or cleared by the [place] FDA and has been authorized for detection and/or diagnosis of SARS-CoV-2 by FDA under an Emergency Use Authorization (EUA). This EUA will remain in effect (meaning this test can be used) for the duration of the COVID-19 declaration under Section 564(b)(1) of the Act, 21 U.S.C. section 360bbb-3(b)(1), unless the authorization is terminated or revoked.  Performed at [facility], [place], [place] [address]   C Difficile Quick Screen w PCR reflex     Status: Abnormal   Collection Time: 10/30/21  8:52 AM   Specimen: STOOL  Result Value Ref Range Status   C Diff antigen POSITIVE (A) NEGATIVE Final   C Diff toxin NEGATIVE NEGATIVE Final   C Diff interpretation Results are indeterminate. See PCR results.  Final    Comment: Performed at [facility], [address] [place],  [address]  Gastrointestinal Panel by PCR , Stool     Status: None   Collection Time: 10/30/21  8:52 AM   Specimen: Stool  Result Value Ref Range Status   Campylobacter species NOT DETECTED NOT DETECTED Final   Plesimonas shigelloides NOT DETECTED NOT DETECTED Final   Salmonella species NOT  DETECTED NOT DETECTED Final   Yersinia enterocolitica NOT DETECTED NOT DETECTED Final   Vibrio species NOT DETECTED NOT DETECTED Final   Vibrio cholerae NOT DETECTED NOT DETECTED Final   Enteroaggregative E coli (EAEC) NOT DETECTED NOT DETECTED Final   Enteropathogenic E coli (EPEC) NOT DETECTED NOT DETECTED Final   Enterotoxigenic E coli (ETEC) NOT DETECTED NOT DETECTED Final   Shiga like toxin producing E coli (STEC) NOT DETECTED NOT DETECTED Final   Shigella/Enteroinvasive E coli (EIEC) NOT DETECTED NOT DETECTED Final   Cryptosporidium NOT DETECTED NOT DETECTED Final   Cyclospora cayetanensis NOT DETECTED NOT DETECTED Final   Entamoeba histolytica NOT DETECTED NOT DETECTED Final   Giardia lamblia NOT DETECTED NOT DETECTED Final   Adenovirus F40/41 NOT DETECTED NOT DETECTED Final   Astrovirus NOT DETECTED NOT DETECTED Final   Norovirus GI/GII NOT DETECTED NOT DETECTED Final   Rotavirus A NOT DETECTED NOT DETECTED Final   Sapovirus (I, II, IV, and V) NOT DETECTED NOT DETECTED Final    Comment: Performed at [facility], [address] [place], [place]  [address]  C. Diff by PCR, Reflexed     Status: None   Collection Time: 10/30/21  8:52 AM  Result Value Ref Range Status   Toxigenic C. Difficile by PCR NEGATIVE NEGATIVE Final    Comment: Patient is colonized with non toxigenic C. difficile. May not need treatment unless significant symptoms are present. Performed at [facility], [address] [place], [place] [address]     Coagulation Studies: Recent Labs    10/28/21 0915  LABPROT 16.8*  INR 1.4*     Urinalysis: Recent Labs    10/30/21 0852  COLORURINE STRAW*  LABSPEC 1.015  PHURINE 8.0  GLUCOSEU NEGATIVE  HGBUR SMALL*  BILIRUBINUR NEGATIVE  KETONESUR NEGATIVE  PROTEINUR NEGATIVE  NITRITE NEGATIVE  LEUKOCYTESUR SMALL*       Imaging: DG UGI W SINGLE CM (SOL OR THIN BA)  Result Date: 10/29/2021 CLINICAL DATA:  Upper GI bleeding EXAM:  UPPER GI SERIES WITHOUT KUB TECHNIQUE: Routine upper GI series was performed with thin/high density/water soluble barium. FLUOROSCOPY TIME:  Fluoroscopy Time:  1.9 cm Radiation Exposure Index (if provided by the fluoroscopic device): 1.9 cm Number of Acquired Spot Images: 0 COMPARISON:  None. FINDINGS: UPPER GI SERIES: Examination of the esophagus demonstrated normal esophageal motility. Normal esophageal morphology without evidence of esophagitis or ulceration. No esophageal diverticula, or mass lesion. Focal narrowing of the distal esophagus at the gastroesophageal junction restricting the passage of a barium tablet consistent with a stricture. No evidence of hiatal hernia. No spontaneous or inducible gastroesophageal reflux. Examination of the stomach demonstrated normal rugal folds and areae gastricae. Gastric mucosa appeared unremarkable without evidence of ulceration, scarring, or mass lesion. Gastric motility and emptying was normal. Fluoroscopic examination of the duodenum demonstrates normal motility and morphology without evidence of ulceration or mass lesion. IMPRESSION: 1. 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 Electronically Signed   By: Kathreen Devoid M.D.   On: 10/29/2021 13:39     Medications:    sodium chloride 75 mL/hr at 10/30/21 1313    Chlorhexidine Gluconate Cloth  6 each Topical Daily   insulin aspart  0-5 Units Subcutaneous QHS   insulin aspart  0-9 Units Subcutaneous TID WC   insulin aspart  4 Units Subcutaneous TID WC   multivitamin with minerals  1 tablet Oral Daily   pantoprazole (PROTONIX) IV  40 mg Intravenous Q12H   vancomycin  125 mg Oral QID   acetaminophen, hydrALAZINE, ondansetron (ZOFRAN) IV  Assessment/ Plan:  Ms. AURIEL KIST is a 76 y.o. black female diabetes mellitus type II, GERD, anemia, hypertension, lymphocytic colitis, and hyperlipidemia, who was admitted to [facility] on 10/28/2021 for  GI bleeding [K92.2] Upper GI bleed [K92.2] AKI (acute kidney injury) ([place]) [N17.9] Acute renal failure, unspecified acute renal failure type ([place]) [N17.9] Gastrointestinal hemorrhage, unspecified gastrointestinal hemorrhage type [K92.2]   Acute kidney injury: with hyperkalemia and metabolic acidosis acute. Baseline creatinine unknown. Urinalysis negative for proteinuria. Acute kidney injury secondary to prerenal azotemia with dehydration, poor PO intake, losartan and hydrochlorothiazide. Nonobstructive stone as incidental finding. No IV contrast exposure.  Now with hypokalemia - no indication for dialysis. Nonoliguric urine output.  - holding losartan and hydrochlorothiazide.  - Change IV fluids to normal saline: 40mL/hr  Hypertension: 106/58. Holding home blood pressure regimen of amlodipine, losartan, and hydrochlorothiazide. No indication to restart at this time.   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 Holding metformin.  LOS: 2 Patricia Cross 1/14/20232:03 PM

## 2021-10-31 DIAGNOSIS — A0472 Enterocolitis due to Clostridium difficile, not specified as recurrent: Principal | ICD-10-CM

## 2021-10-31 LAB — BASIC METABOLIC PANEL
Anion gap: 11 (ref 5–15)
BUN: 74 mg/dL — ABNORMAL HIGH (ref 8–23)
CO2: 27 mmol/L (ref 22–32)
Calcium: 7.8 mg/dL — ABNORMAL LOW (ref 8.9–10.3)
Chloride: 101 mmol/L (ref 98–111)
Creatinine, Ser: 4.84 mg/dL — ABNORMAL HIGH (ref 0.44–1.00)
GFR, Estimated: 9 mL/min — ABNORMAL LOW (ref >60)
Glucose, Bld: 187 mg/dL — ABNORMAL HIGH (ref 70–99)
Potassium: 3.4 mmol/L — ABNORMAL LOW (ref 3.5–5.1)
Sodium: 139 mmol/L (ref 135–145)

## 2021-10-31 LAB — MAGNESIUM: Magnesium: 1.8 mg/dL (ref 1.7–2.4)

## 2021-10-31 LAB — GLUCOSE, CAPILLARY
Glucose-Capillary: 185 mg/dL — ABNORMAL HIGH (ref 70–99)
Glucose-Capillary: 317 mg/dL — ABNORMAL HIGH (ref 70–99)
Glucose-Capillary: 242 mg/dL — ABNORMAL HIGH (ref 70–99)
Glucose-Capillary: 183 mg/dL — ABNORMAL HIGH (ref 70–99)

## 2021-10-31 LAB — PHOSPHORUS: Phosphorus: 2.9 mg/dL (ref 2.5–4.6)

## 2021-10-31 MED ORDER — POTASSIUM CHLORIDE IN NACL 20-0.9 MEQ/L-% IV SOLN
INTRAVENOUS | Status: AC
  Filled 2021-10-31 (×4): qty 1000

## 2021-10-31 MED ORDER — POTASSIUM CHLORIDE 20 MEQ PO PACK
40.0000 meq | PACK | Freq: Two times a day (BID) | ORAL | Status: DC
  Administered 2021-10-31: 40 meq via ORAL
  Filled 2021-10-31: qty 2

## 2021-10-31 NOTE — Progress Notes (Signed)
PROGRESS NOTE    Patricia Cross  INO:[id number] DOB: [birth year] DOA: 10/28/2021 PCP: Perrin Maltese, MD    Brief Narrative:  Patricia Cross is a 76 y.o. female with medical history significant of lymphocytic colitis, hypertension, hyperlipidemia, diabetes mellitus, GERD, anemia, who presents with black stool, nausea, vomiting, diarrhea. Hemoglobin has been stable, but patient was found to have acute kidney injury with creatinine of 6.93, bicarb less than 7. Patient was followed by nephrology, started on bicarb drip.   Assessment & Plan:   Principal Problem:   GI bleeding Active Problems:   Hypertension   Type II diabetes mellitus with renal manifestations (HCC)   AKI (acute kidney injury) ([place])   Hyperkalemia   Right ureteral stone   Leukocytosis   Nausea vomiting and diarrhea   Normocytic anemia   HLD (hyperlipidemia)  Acute kidney injury secondary to ATN. Severe metabolic acidosis. Hyperkalemia. Hypokalemia Right side kidney stone. Patient condition continued to improve, renal function better.  Appreciate nephrology consult.  IV fluid changed to normal saline with added potassium. Renal ultrasound did not show any hydronephrosis.  No need to see urology.  Uncontrolled type 2 diabetes with hyperglycemia Continue sliding scale insulin and scheduled NovoLog.   C. difficile colitis. Melena with diarrhea. Patient tested positive for C. difficile antigen, but negative toxin.  However, patient has been having diarrhea for several days which is partially causing acute renal failure.  I opt to treat with vancomycin for 10 days.  Anemia of chronic kidney disease. Patient has adequate iron B12 level.     DVT prophylaxis: SCDs Code Status: full Family Communication: Son updated at the bedside Disposition Plan:      Status is: Inpatient   Remains inpatient appropriate because: Disease, IV treatment.     I/O last 3 completed shifts: In: 2789.5 [P.O.:560;  I.V.:2229.5] Out: 8750 [Urine:8750] Total I/O In: 1362.3 [I.V.:1362.3] Out: -      Consultants:  Nephrology  Procedures: None  Antimicrobials: None  Subjective: Patient doing well today, denies any short of breath or cough. She has good appetite without nausea vomiting.  Diarrhea is better, no additional melena. Denies any fever or chills.  Objective: Vitals:   10/30/21 2127 10/31/21 0500 10/31/21 0539 10/31/21 0828  BP: (!) 123/53  124/60 (!) 126/52  Pulse: 90  88 85  Resp: 16  16 18   Temp: 99.3 F (37.4 C)  98.5 F (36.9 C) 98.3 F (36.8 C)  TempSrc: Oral  Oral Oral  SpO2: 94%  96% 98%  Weight:  54.8 kg    Height:        Intake/Output Summary (Last 24 hours) at 10/31/2021 1300 Last data filed at 10/31/2021 1039 Gross per 24 hour  Intake 1771.12 ml  Output 5300 ml  Net -3528.88 ml   Filed Weights   10/28/21 0914 10/30/21 0616 10/31/21 0500  Weight: 53.5 kg 60.7 kg 54.8 kg    Examination:  General exam: Appears calm and comfortable  Respiratory system: Clear to auscultation. Respiratory effort normal. Cardiovascular system: S1 & S2 heard, RRR. No JVD, murmurs, rubs, gallops or clicks. No pedal edema. Gastrointestinal system: Abdomen is nondistended, soft and nontender. No organomegaly or masses felt. Normal bowel sounds heard. Central nervous system: Alert and oriented. No focal neurological deficits. Extremities: Symmetric 5 x 5 power. Skin: No rashes, lesions or ulcers Psychiatry: Judgement and insight appear normal. Mood & affect appropriate.     Data Reviewed: I have personally reviewed following labs and imaging studies  CBC: Recent Labs  Lab 10/28/21 1816 10/29/21 0457 10/29/21 1835 10/30/21 0618 10/30/21 1825  WBC 13.3* 12.1* 8.5 5.7 6.0  HGB 9.3* 9.7* 10.0* 9.7* 10.2*  HCT 26.9* 27.5* 28.0* 27.1* 28.9*  MCV 86.8 83.8 83.1 82.6 85.3  PLT 245 239 227 196 557   Basic Metabolic Panel: Recent Labs  Lab 10/28/21 0915 10/28/21 1816  10/29/21 0457 10/30/21 0618 10/31/21 0600  NA 137  --  139 140 139  K 5.3* 4.6 3.7 3.4* 3.4*  CL 94*  --  95* 96* 101  CO2 <7*  --  25 33* 27  GLUCOSE 271*  --  243* 121* 187*  BUN 99*  --  102* 92* 74*  CREATININE 6.93*  --  6.65* 5.83* 4.84*  CALCIUM 8.8*  --  7.6* 7.8* 7.8*  MG  --   --   --   --  1.8  PHOS  --   --   --   --  2.9   GFR: Estimated Creatinine Clearance: 8.1 mL/min (A) (by C-G formula based on SCr of 4.84 mg/dL (H)). Liver Function Tests: Recent Labs  Lab 10/28/21 0915  AST 44*  ALT 24  ALKPHOS 53  BILITOT 0.8  PROT 7.0  ALBUMIN 3.7   No results for input(s): LIPASE, AMYLASE in the last 168 hours. No results for input(s): AMMONIA in the last 168 hours. Coagulation Profile: Recent Labs  Lab 10/28/21 0915  INR 1.4*   Cardiac Enzymes: No results for input(s): CKTOTAL, CKMB, CKMBINDEX, TROPONINI in the last 168 hours. BNP (last 3 results) No results for input(s): PROBNP in the last 8760 hours. HbA1C: Recent Labs    10/28/21 1816  HGBA1C 8.2*   CBG: Recent Labs  Lab 10/30/21 1309 10/30/21 1637 10/30/21 2210 10/31/21 0829 10/31/21 1227  GLUCAP 400* 270* 204* 185* 317*   Lipid Profile: No results for input(s): CHOL, HDL, LDLCALC, TRIG, CHOLHDL, LDLDIRECT in the last 72 hours. Thyroid Function Tests: No results for input(s): TSH, T4TOTAL, FREET4, T3FREE, THYROIDAB in the last 72 hours. Anemia Panel: Recent Labs    10/29/21 0457 10/29/21 1104  VITAMINB12  --  525  FERRITIN 153  --   TIBC 293  --   IRON 43  --    Sepsis Labs: No results for input(s): PROCALCITON, LATICACIDVEN in the last 168 hours.  Recent Results (from the past 240 hour(s))  Resp Panel by RT-PCR (Flu A&B, Covid) Nasopharyngeal Swab     Status: None   Collection Time: 10/28/21  9:17 AM   Specimen: Nasopharyngeal Swab; Nasopharyngeal(NP) swabs in vial transport medium  Result Value Ref Range Status   SARS Coronavirus 2 by RT PCR NEGATIVE NEGATIVE Final    Comment:  (NOTE) SARS-CoV-2 target nucleic acids are NOT DETECTED.  The SARS-CoV-2 RNA is generally detectable in upper respiratory specimens during the acute phase of infection. The lowest concentration of SARS-CoV-2 viral copies this assay can detect is 138 copies/mL. A negative result does not preclude SARS-Cov-2 infection and should not be used as the sole basis for treatment or other patient management decisions. A negative result may occur with  improper specimen collection/handling, submission of specimen other than nasopharyngeal swab, presence of viral mutation(s) within the areas targeted by this assay, and inadequate number of viral copies(<138 copies/mL). A negative result must be combined with clinical observations, patient history, and epidemiological information. The expected result is Negative.  Fact Sheet for Patients:  EntrepreneurPulse.com.au  Fact Sheet for Healthcare Providers:  IncredibleEmployment.be  This test is no t yet approved or cleared by the [place] and  has been authorized for detection and/or diagnosis of SARS-CoV-2 by FDA under an Emergency Use Authorization (EUA). This EUA will remain  in effect (meaning this test can be used) for the duration of the COVID-19 declaration under Section 564(b)(1) of the Act, 21 U.S.C.section 360bbb-3(b)(1), unless the authorization is terminated  or revoked sooner.       Influenza A by PCR NEGATIVE NEGATIVE Final   Influenza B by PCR NEGATIVE NEGATIVE Final    Comment: (NOTE) The Xpert Xpress SARS-CoV-2/FLU/RSV plus assay is intended as an aid in the diagnosis of influenza from Nasopharyngeal swab specimens and should not be used as a sole basis for treatment. Nasal washings and aspirates are unacceptable for Xpert Xpress SARS-CoV-2/FLU/RSV testing.  Fact Sheet for Patients: EntrepreneurPulse.com.au  Fact Sheet for Healthcare  Providers: IncredibleEmployment.be  This test is not yet approved or cleared by the [place] FDA and has been authorized for detection and/or diagnosis of SARS-CoV-2 by FDA under an Emergency Use Authorization (EUA). This EUA will remain in effect (meaning this test can be used) for the duration of the COVID-19 declaration under Section 564(b)(1) of the Act, 21 U.S.C. section 360bbb-3(b)(1), unless the authorization is terminated or revoked.  Performed at [facility], [place], [place] [address]   C Difficile Quick Screen w PCR reflex     Status: Abnormal   Collection Time: 10/30/21  8:52 AM   Specimen: STOOL  Result Value Ref Range Status   C Diff antigen POSITIVE (A) NEGATIVE Final   C Diff toxin NEGATIVE NEGATIVE Final   C Diff interpretation Results are indeterminate. See PCR results.  Final    Comment: Performed at [facility], [address] [place], [place] [address]  Gastrointestinal Panel by PCR , Stool     Status: None   Collection Time: 10/30/21  8:52 AM   Specimen: Stool  Result Value Ref Range Status   Campylobacter species NOT DETECTED NOT DETECTED Final   Plesimonas shigelloides NOT DETECTED NOT DETECTED Final   Salmonella species NOT DETECTED NOT DETECTED Final   Yersinia enterocolitica NOT DETECTED NOT DETECTED Final   Vibrio species NOT DETECTED NOT DETECTED Final   Vibrio cholerae NOT DETECTED NOT DETECTED Final   Enteroaggregative E coli (EAEC) NOT DETECTED NOT DETECTED Final   Enteropathogenic E coli (EPEC) NOT DETECTED NOT DETECTED Final   Enterotoxigenic E coli (ETEC) NOT DETECTED NOT DETECTED Final   Shiga like toxin producing E coli (STEC) NOT DETECTED NOT DETECTED Final   Shigella/Enteroinvasive E coli (EIEC) NOT DETECTED NOT DETECTED Final   Cryptosporidium NOT DETECTED NOT DETECTED Final   Cyclospora cayetanensis NOT DETECTED NOT DETECTED Final   Entamoeba histolytica NOT DETECTED NOT  DETECTED Final   Giardia lamblia NOT DETECTED NOT DETECTED Final   Adenovirus F40/41 NOT DETECTED NOT DETECTED Final   Astrovirus NOT DETECTED NOT DETECTED Final   Norovirus GI/GII NOT DETECTED NOT DETECTED Final   Rotavirus A NOT DETECTED NOT DETECTED Final   Sapovirus (I, II, IV, and V) NOT DETECTED NOT DETECTED Final    Comment: Performed at [facility], [address] [place], [place] [address]  C. Diff by PCR, Reflexed     Status: None   Collection Time: 10/30/21  8:52 AM  Result Value Ref Range Status   Toxigenic C. Difficile by PCR NEGATIVE NEGATIVE Final    Comment: Patient is colonized  with non toxigenic C. difficile. May not need treatment unless significant symptoms are present. Performed at [facility], [address] [place], [place] [address]          Radiology Studies: US RENAL  Result Date: 10/30/2021 CLINICAL DATA:  Acute renal failure EXAM: RENAL / URINARY TRACT ULTRASOUND COMPLETE COMPARISON:  10/28/2021 FINDINGS: Right Kidney: Renal measurements: 11.2 x 4.6 x 6.3 cm = volume: 169 mL. Mild increased renal cortical echotexture. No hydronephrosis or renal mass. No evidence of nephrolithiasis. Left Kidney: Renal measurements: 10.8 x 5.4 by 4.1 cm = volume: 151 ML. Mild increased renal cortical echotexture. No hydronephrosis or renal mass. No evidence of nephrolithiasis. Bladder: The bladder is decompressed, limiting its evaluation. Other: None. IMPRESSION: 1. Mild bilateral increased renal cortical echotexture, consistent with medical renal disease. Otherwise unremarkable exam. Electronically Signed   By: Randa Ngo M.D.   On: 10/30/2021 15:28        Scheduled Meds:  Chlorhexidine Gluconate Cloth  6 each Topical Daily   insulin aspart  0-5 Units Subcutaneous QHS   insulin aspart  0-9 Units Subcutaneous TID WC   insulin aspart  4 Units Subcutaneous TID WC   multivitamin with minerals  1 tablet Oral Daily   pantoprazole (PROTONIX) IV  40  mg Intravenous Q12H   vancomycin  125 mg Oral QID   Continuous Infusions:  0.9 % NaCl with KCl 20 mEq / L       LOS: 3 days    Time spent: 32 minutes, more than 50% time involving direct patient care    Sharen Hones, MD [facility]   To contact the attending provider between 7A-7P or the covering provider during after hours 7P-7A, please log into the web site [email or web address] and access using universal [place] password for that web site. If you do not have the password, please call the hospital operator.  10/31/2021, 1:00 PM

## 2021-10-31 NOTE — Progress Notes (Signed)
[facility]  ROUNDING NOTE   Subjective:   UOP recorded at 6353mL.   Creatinine 4.84 (5.83) (6.65 )   Patient without complaints.   Objective:  Vital signs in last 24 hours:  Temp:  [98.3 F (36.8 C)-99.3 F (37.4 C)] 98.3 F (36.8 C) (01/15 0828) Pulse Rate:  [85-101] 85 (01/15 0828) Resp:  [16-18] 18 (01/15 0828) BP: (123-133)/(52-66) 126/52 (01/15 0828) SpO2:  [94 %-98 %] 98 % (01/15 0828) Weight:  [54.8 kg] 54.8 kg (01/15 0500)  Weight change: -5.9 kg Filed Weights   10/28/21 0914 10/30/21 0616 10/31/21 0500  Weight: 53.5 kg 60.7 kg 54.8 kg    Intake/Output: I/O last 3 completed shifts: In: 2789.5 [P.O.:560; I.V.:2229.5] Out: 8750 [Urine:8750]   Intake/Output this shift:  Total I/O In: 1362.3 [I.V.:1362.3] Out: -   Physical Exam: General: NAD, laying in bed  Head: Normocephalic, atraumatic. Moist oral mucosal membranes  Eyes: Anicteric  Lungs:  Clear to auscultation, normal effort  Heart: Regular rate and rhythm  Abdomen:  Soft, nontender  Extremities:  no peripheral edema.  Neurologic: Nonfocal, moving all four extremities  Skin: No lesions       Basic Metabolic Panel: Recent Labs  Lab 10/28/21 0915 10/28/21 1816 10/29/21 0457 10/30/21 0618 10/31/21 0600  NA 137  --  139 140 139  K 5.3* 4.6 3.7 3.4* 3.4*  CL 94*  --  95* 96* 101  CO2 <7*  --  25 33* 27  GLUCOSE 271*  --  243* 121* 187*  BUN 99*  --  102* 92* 74*  CREATININE 6.93*  --  6.65* 5.83* 4.84*  CALCIUM 8.8*  --  7.6* 7.8* 7.8*  MG  --   --   --   --  1.8  PHOS  --   --   --   --  2.9     Liver Function Tests: Recent Labs  Lab 10/28/21 0915  AST 44*  ALT 24  ALKPHOS 53  BILITOT 0.8  PROT 7.0  ALBUMIN 3.7    No results for input(s): LIPASE, AMYLASE in the last 168 hours. No results for input(s): AMMONIA in the last 168 hours.  CBC: Recent Labs  Lab 10/28/21 1816 10/29/21 0457 10/29/21 1835 10/30/21 0618 10/30/21 1825  WBC 13.3* 12.1* 8.5 5.7 6.0   HGB 9.3* 9.7* 10.0* 9.7* 10.2*  HCT 26.9* 27.5* 28.0* 27.1* 28.9*  MCV 86.8 83.8 83.1 82.6 85.3  PLT 245 239 227 196 194     Cardiac Enzymes: No results for input(s): CKTOTAL, CKMB, CKMBINDEX, TROPONINI in the last 168 hours.  BNP: Invalid input(s): POCBNP  CBG: Recent Labs  Lab 10/30/21 0735 10/30/21 1309 10/30/21 1637 10/30/21 2210 10/31/21 0829  GLUCAP 132* 400* 270* 204* 185*     Microbiology: Results for orders placed or performed during the hospital encounter of 10/28/21  Resp Panel by RT-PCR (Flu A&B, Covid) Nasopharyngeal Swab     Status: None   Collection Time: 10/28/21  9:17 AM   Specimen: Nasopharyngeal Swab; Nasopharyngeal(NP) swabs in vial transport medium  Result Value Ref Range Status   SARS Coronavirus 2 by RT PCR NEGATIVE NEGATIVE Final    Comment: (NOTE) SARS-CoV-2 target nucleic acids are NOT DETECTED.  The SARS-CoV-2 RNA is generally detectable in upper respiratory specimens during the acute phase of infection. The lowest concentration of SARS-CoV-2 viral copies this assay can detect is 138 copies/mL. A negative result does not preclude SARS-Cov-2 infection and should not be used as the  sole basis for treatment or other patient management decisions. A negative result may occur with  improper specimen collection/handling, submission of specimen other than nasopharyngeal swab, presence of viral mutation(s) within the areas targeted by this assay, and inadequate number of viral copies(<138 copies/mL). A negative result must be combined with clinical observations, patient history, and epidemiological information. The expected result is Negative.  Fact Sheet for Patients:  EntrepreneurPulse.com.au  Fact Sheet for Healthcare Providers:  IncredibleEmployment.be  This test is no t yet approved or cleared by the [place] FDA and  has been authorized for detection and/or diagnosis of SARS-CoV-2 by FDA under an  Emergency Use Authorization (EUA). This EUA will remain  in effect (meaning this test can be used) for the duration of the COVID-19 declaration under Section 564(b)(1) of the Act, 21 U.S.C.section 360bbb-3(b)(1), unless the authorization is terminated  or revoked sooner.       Influenza A by PCR NEGATIVE NEGATIVE Final   Influenza B by PCR NEGATIVE NEGATIVE Final    Comment: (NOTE) The Xpert Xpress SARS-CoV-2/FLU/RSV plus assay is intended as an aid in the diagnosis of influenza from Nasopharyngeal swab specimens and should not be used as a sole basis for treatment. Nasal washings and aspirates are unacceptable for Xpert Xpress SARS-CoV-2/FLU/RSV testing.  Fact Sheet for Patients: EntrepreneurPulse.com.au  Fact Sheet for Healthcare Providers: IncredibleEmployment.be  This test is not yet approved or cleared by the [place] FDA and has been authorized for detection and/or diagnosis of SARS-CoV-2 by FDA under an Emergency Use Authorization (EUA). This EUA will remain in effect (meaning this test can be used) for the duration of the COVID-19 declaration under Section 564(b)(1) of the Act, 21 U.S.C. section 360bbb-3(b)(1), unless the authorization is terminated or revoked.  Performed at [facility], [place], [place] [address]   C Difficile Quick Screen w PCR reflex     Status: Abnormal   Collection Time: 10/30/21  8:52 AM   Specimen: STOOL  Result Value Ref Range Status   C Diff antigen POSITIVE (A) NEGATIVE Final   C Diff toxin NEGATIVE NEGATIVE Final   C Diff interpretation Results are indeterminate. See PCR results.  Final    Comment: Performed at [facility], [address] [place],  [address]  Gastrointestinal Panel by PCR , Stool     Status: None   Collection Time: 10/30/21  8:52 AM   Specimen: Stool  Result Value Ref Range Status   Campylobacter species NOT DETECTED NOT DETECTED  Final   Plesimonas shigelloides NOT DETECTED NOT DETECTED Final   Salmonella species NOT DETECTED NOT DETECTED Final   Yersinia enterocolitica NOT DETECTED NOT DETECTED Final   Vibrio species NOT DETECTED NOT DETECTED Final   Vibrio cholerae NOT DETECTED NOT DETECTED Final   Enteroaggregative E coli (EAEC) NOT DETECTED NOT DETECTED Final   Enteropathogenic E coli (EPEC) NOT DETECTED NOT DETECTED Final   Enterotoxigenic E coli (ETEC) NOT DETECTED NOT DETECTED Final   Shiga like toxin producing E coli (STEC) NOT DETECTED NOT DETECTED Final   Shigella/Enteroinvasive E coli (EIEC) NOT DETECTED NOT DETECTED Final   Cryptosporidium NOT DETECTED NOT DETECTED Final   Cyclospora cayetanensis NOT DETECTED NOT DETECTED Final   Entamoeba histolytica NOT DETECTED NOT DETECTED Final   Giardia lamblia NOT DETECTED NOT DETECTED Final   Adenovirus F40/41 NOT DETECTED NOT DETECTED Final   Astrovirus NOT DETECTED NOT DETECTED Final   Norovirus GI/GII NOT DETECTED NOT DETECTED Final  Rotavirus A NOT DETECTED NOT DETECTED Final   Sapovirus (I, II, IV, and V) NOT DETECTED NOT DETECTED Final    Comment: Performed at [facility], [address] [place], [place] [address]  C. Diff by PCR, Reflexed     Status: None   Collection Time: 10/30/21  8:52 AM  Result Value Ref Range Status   Toxigenic C. Difficile by PCR NEGATIVE NEGATIVE Final    Comment: Patient is colonized with non toxigenic C. difficile. May not need treatment unless significant symptoms are present. Performed at [facility], [address] [place], [place] [address]     Coagulation Studies: No results for input(s): LABPROT, INR in the last 72 hours.   Urinalysis: Recent Labs    10/30/21 0852  COLORURINE STRAW*  LABSPEC 1.015  PHURINE 8.0  GLUCOSEU NEGATIVE  HGBUR SMALL*  BILIRUBINUR NEGATIVE  KETONESUR NEGATIVE  PROTEINUR NEGATIVE  NITRITE NEGATIVE  LEUKOCYTESUR SMALL*       Imaging: US  RENAL  Result Date: 10/30/2021 CLINICAL DATA:  Acute renal failure EXAM: RENAL / URINARY TRACT ULTRASOUND COMPLETE COMPARISON:  10/28/2021 FINDINGS: Right Kidney: Renal measurements: 11.2 x 4.6 x 6.3 cm = volume: 169 mL. Mild increased renal cortical echotexture. No hydronephrosis or renal mass. No evidence of nephrolithiasis. Left Kidney: Renal measurements: 10.8 x 5.4 by 4.1 cm = volume: 151 ML. Mild increased renal cortical echotexture. No hydronephrosis or renal mass. No evidence of nephrolithiasis. Bladder: The bladder is decompressed, limiting its evaluation. Other: None. IMPRESSION: 1. Mild bilateral increased renal cortical echotexture, consistent with medical renal disease. Otherwise unremarkable exam. Electronically Signed   By: Randa Ngo M.D.   On: 10/30/2021 15:28   DG UGI W SINGLE CM (SOL OR THIN BA)  Result Date: 10/29/2021 CLINICAL DATA:  Upper GI bleeding EXAM: UPPER GI SERIES WITHOUT KUB TECHNIQUE: Routine upper GI series was performed with thin/high density/water soluble barium. FLUOROSCOPY TIME:  Fluoroscopy Time:  1.9 cm Radiation Exposure Index (if provided by the fluoroscopic device): 1.9 cm Number of Acquired Spot Images: 0 COMPARISON:  None. FINDINGS: UPPER GI SERIES: Examination of the esophagus demonstrated normal esophageal motility. Normal esophageal morphology without evidence of esophagitis or ulceration. No esophageal diverticula, or mass lesion. Focal narrowing of the distal esophagus at the gastroesophageal junction restricting the passage of a barium tablet consistent with a stricture. No evidence of hiatal hernia. No spontaneous or inducible gastroesophageal reflux. Examination of the stomach demonstrated normal rugal folds and areae gastricae. Gastric mucosa appeared unremarkable without evidence of ulceration, scarring, or mass lesion. Gastric motility and emptying was normal. Fluoroscopic examination of the duodenum demonstrates normal motility and morphology without  evidence of ulceration or mass lesion. IMPRESSION: 1. 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 Electronically Signed   By: Kathreen Devoid M.D.   On: 10/29/2021 13:39     Medications:    0.9 % NaCl with KCl 20 mEq / L      Chlorhexidine Gluconate Cloth  6 each Topical Daily   insulin aspart  0-5 Units Subcutaneous QHS   insulin aspart  0-9 Units Subcutaneous TID WC   insulin aspart  4 Units Subcutaneous TID WC   multivitamin with minerals  1 tablet Oral Daily   pantoprazole (PROTONIX) IV  40 mg Intravenous Q12H   vancomycin  125 mg Oral QID   acetaminophen, hydrALAZINE, ondansetron (ZOFRAN) IV  Assessment/ Plan:  Ms. Patricia SAMARIN is a 76 y.o.  black female diabetes mellitus type II, GERD, anemia, hypertension, lymphocytic colitis, and hyperlipidemia, who was admitted to [facility] on 10/28/2021 for GI bleeding [K92.2] Upper GI bleed [K92.2] AKI (acute kidney injury) ([place]) [N17.9] Acute renal failure, unspecified acute renal failure type ([place]) [N17.9] Gastrointestinal hemorrhage, unspecified gastrointestinal hemorrhage type [K92.2]   Acute kidney injury: with hyperkalemia and metabolic acidosis acute. Baseline creatinine unknown. Urinalysis negative for proteinuria. Acute kidney injury secondary to prerenal azotemia with dehydration, poor PO intake, losartan and hydrochlorothiazide. Nonobstructive stone as incidental finding. No IV contrast exposure.  Now with hypokalemia - no indication for dialysis. Nonoliguric urine output.  - holding losartan and hydrochlorothiazide.  - Change IV fluids to normal saline with 20KCl: 27mL/hr  Hypertension: 126/52. Holding home blood pressure regimen of amlodipine, losartan, and hydrochlorothiazide. No indication to restart at this time.   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 Holding metformin.     LOS: 3 [place] 1/15/202310:52 AM

## 2021-11-01 ENCOUNTER — Other Ambulatory Visit (HOSPITAL_COMMUNITY): Payer: Self-pay

## 2021-11-01 LAB — GLUCOSE, CAPILLARY
Glucose-Capillary: 231 mg/dL — ABNORMAL HIGH (ref 70–99)
Glucose-Capillary: 327 mg/dL — ABNORMAL HIGH (ref 70–99)
Glucose-Capillary: 187 mg/dL — ABNORMAL HIGH (ref 70–99)
Glucose-Capillary: 164 mg/dL — ABNORMAL HIGH (ref 70–99)

## 2021-11-01 LAB — PROTEIN ELECTROPHORESIS, SERUM
A/G Ratio: 1.4 (ref 0.7–1.7)
Albumin ELP: 3.4 g/dL (ref 2.9–4.4)
Alpha-1-Globulin: 0.2 g/dL (ref 0.0–0.4)
Alpha-2-Globulin: 0.6 g/dL (ref 0.4–1.0)
Beta Globulin: 0.7 g/dL (ref 0.7–1.3)
Gamma Globulin: 0.9 g/dL (ref 0.4–1.8)
Globulin, Total: 2.4 g/dL (ref 2.2–3.9)
Total Protein ELP: 5.8 g/dL — ABNORMAL LOW (ref 6.0–8.5)

## 2021-11-01 LAB — BASIC METABOLIC PANEL
Anion gap: 8 (ref 5–15)
BUN: 60 mg/dL — ABNORMAL HIGH (ref 8–23)
CO2: 24 mmol/L (ref 22–32)
Calcium: 7.7 mg/dL — ABNORMAL LOW (ref 8.9–10.3)
Chloride: 106 mmol/L (ref 98–111)
Creatinine, Ser: 3.98 mg/dL — ABNORMAL HIGH (ref 0.44–1.00)
GFR, Estimated: 11 mL/min — ABNORMAL LOW (ref >60)
Glucose, Bld: 228 mg/dL — ABNORMAL HIGH (ref 70–99)
Potassium: 4.2 mmol/L (ref 3.5–5.1)
Sodium: 138 mmol/L (ref 135–145)

## 2021-11-01 MED ORDER — INSULIN DETEMIR 100 UNIT/ML ~~LOC~~ SOLN
6.0000 [IU] | Freq: Every day | SUBCUTANEOUS | Status: DC
  Administered 2021-11-01 – 2021-11-03 (×3): 6 [IU] via SUBCUTANEOUS
  Filled 2021-11-01 (×3): qty 0.06

## 2021-11-01 NOTE — TOC Benefit Eligibility Note (Addendum)
Patient Teacher, English as a foreign language completed.    The patient is currently admitted and upon discharge could be taking vancomycin 125 mg capsule.  The current 10 day co-pay is, $30.00.   The patient is insured through [employer] Medicare Part D     Lyndel Safe, [place] Patient Advocate Specialist [place] Patient Advocate Team Direct Number: [phone]  Fax: [phone]

## 2021-11-01 NOTE — Progress Notes (Signed)
PROGRESS NOTE    COLETA GROSSHANS  XVQ:[id number] DOB: 03/15/1946 DOA: 10/28/2021 PCP: Perrin Maltese, MD    Brief Narrative:  Patricia Cross is a 76 y.o. female with medical history significant of lymphocytic colitis, hypertension, hyperlipidemia, diabetes mellitus, GERD, anemia, who presents with black stool, nausea, vomiting, diarrhea. Hemoglobin has been stable, but patient was found to have acute kidney injury with creatinine of 6.93, bicarb less than 7. Patient was followed by nephrology, started on bicarb drip.   Assessment & Plan:   Principal Problem:   GI bleeding Active Problems:   Hypertension   Type II diabetes mellitus with renal manifestations (HCC)   AKI (acute kidney injury) ([place])   Hyperkalemia   Right ureteral stone   Leukocytosis   Nausea vomiting and diarrhea   Normocytic anemia   HLD (hyperlipidemia)   C. difficile colitis   Acute kidney injury secondary to ATN. Severe metabolic acidosis. Hyperkalemia. Hypokalemia Patient is a followed by nephrology.  Renal function gradually improving.  Continue normal saline with potassium. Metabolic acidosis has resolved. Continue IV fluids. Renal ultrasound did not show any kidney stone.  Diagnosis ruled out.  Uncontrolled type 2 diabetes with hyperglycemia Hemoglobin A1c 8.2.  Currently His insulin and scheduled NovoLog, glucose still running high, will add Levemir 6 units.   C. difficile colitis. Melena with diarrhea. Diarrhea essentially resolved, continue oral vancomycin to complete 10-day course   Anemia of chronic kidney disease. Patient has adequate iron B12 level.     DVT prophylaxis: SCDs Code Status: full Family Communication: Son updated at the bedside Disposition Plan:      Status is: Inpatient   Remains inpatient appropriate because: Disease, IV treatment.         I/O last 3 completed shifts: In: 2249.2 [I.V.:2249.2] Out: 0932 [Urine:5675] No intake/output data  recorded.     Consultants:  Nephrology  Procedures: None  Antimicrobials: None  Subjective: Patient doing well today, good appetite without nausea vomiting. No abdominal pain. Denies any short of breath or cough. No fever or chills.   Objective: Vitals:   10/31/21 1937 11/01/21 0328 11/01/21 0356 11/01/21 0800  BP: (!) 129/59 (!) 129/55  (!) 131/47  Pulse: 83 81  79  Resp: 18 16    Temp: 98.4 F (36.9 C) 98.5 F (36.9 C)  98.5 F (36.9 C)  TempSrc:    Oral  SpO2: 100% 100%  100%  Weight:   57.4 kg   Height:        Intake/Output Summary (Last 24 hours) at 11/01/2021 1244 Last data filed at 11/01/2021 6712 Gross per 24 hour  Intake 886.83 ml  Output 3075 ml  Net -2188.17 ml   Filed Weights   10/30/21 0616 10/31/21 0500 11/01/21 0356  Weight: 60.7 kg 54.8 kg 57.4 kg    Examination:  General exam: Appears calm and comfortable  Respiratory system: Clear to auscultation. Respiratory effort normal. Cardiovascular system: S1 & S2 heard, RRR. No JVD, murmurs, rubs, gallops or clicks. No pedal edema. Gastrointestinal system: Abdomen is nondistended, soft and nontender. No organomegaly or masses felt. Normal bowel sounds heard. Central nervous system: Alert and oriented. No focal neurological deficits. Extremities: Symmetric 5 x 5 power. Skin: No rashes, lesions or ulcers Psychiatry: Judgement and insight appear normal. Mood & affect appropriate.     Data Reviewed: I have personally reviewed following labs and imaging studies  CBC: Recent Labs  Lab 10/28/21 1816 10/29/21 0457 10/29/21 1835 10/30/21 0618 10/30/21 1825  WBC  13.3* 12.1* 8.5 5.7 6.0  HGB 9.3* 9.7* 10.0* 9.7* 10.2*  HCT 26.9* 27.5* 28.0* 27.1* 28.9*  MCV 86.8 83.8 83.1 82.6 85.3  PLT 245 239 227 196 409   Basic Metabolic Panel: Recent Labs  Lab 10/28/21 0915 10/28/21 1816 10/29/21 0457 10/30/21 0618 10/31/21 0600 11/01/21 0632  NA 137  --  139 140 139 138  K 5.3* 4.6 3.7 3.4* 3.4*  4.2  CL 94*  --  95* 96* 101 106  CO2 <7*  --  25 33* 27 24  GLUCOSE 271*  --  243* 121* 187* 228*  BUN 99*  --  102* 92* 74* 60*  CREATININE 6.93*  --  6.65* 5.83* 4.84* 3.98*  CALCIUM 8.8*  --  7.6* 7.8* 7.8* 7.7*  MG  --   --   --   --  1.8  --   PHOS  --   --   --   --  2.9  --    GFR: Estimated Creatinine Clearance: 9.6 mL/min (A) (by C-G formula based on SCr of 3.98 mg/dL (H)). Liver Function Tests: Recent Labs  Lab 10/28/21 0915  AST 44*  ALT 24  ALKPHOS 53  BILITOT 0.8  PROT 7.0  ALBUMIN 3.7   No results for input(s): LIPASE, AMYLASE in the last 168 hours. No results for input(s): AMMONIA in the last 168 hours. Coagulation Profile: Recent Labs  Lab 10/28/21 0915  INR 1.4*   Cardiac Enzymes: No results for input(s): CKTOTAL, CKMB, CKMBINDEX, TROPONINI in the last 168 hours. BNP (last 3 results) No results for input(s): PROBNP in the last 8760 hours. HbA1C: No results for input(s): HGBA1C in the last 72 hours. CBG: Recent Labs  Lab 10/31/21 1227 10/31/21 1619 10/31/21 2122 11/01/21 0808 11/01/21 1109  GLUCAP 317* 242* 183* 231* 327*   Lipid Profile: No results for input(s): CHOL, HDL, LDLCALC, TRIG, CHOLHDL, LDLDIRECT in the last 72 hours. Thyroid Function Tests: No results for input(s): TSH, T4TOTAL, FREET4, T3FREE, THYROIDAB in the last 72 hours. Anemia Panel: No results for input(s): VITAMINB12, FOLATE, FERRITIN, TIBC, IRON, RETICCTPCT in the last 72 hours. Sepsis Labs: No results for input(s): PROCALCITON, LATICACIDVEN in the last 168 hours.  Recent Results (from the past 240 hour(s))  Resp Panel by RT-PCR (Flu A&B, Covid) Nasopharyngeal Swab     Status: None   Collection Time: 10/28/21  9:17 AM   Specimen: Nasopharyngeal Swab; Nasopharyngeal(NP) swabs in vial transport medium  Result Value Ref Range Status   SARS Coronavirus 2 by RT PCR NEGATIVE NEGATIVE Final    Comment: (NOTE) SARS-CoV-2 target nucleic acids are NOT DETECTED.  The  SARS-CoV-2 RNA is generally detectable in upper respiratory specimens during the acute phase of infection. The lowest concentration of SARS-CoV-2 viral copies this assay can detect is 138 copies/mL. A negative result does not preclude SARS-Cov-2 infection and should not be used as the sole basis for treatment or other patient management decisions. A negative result may occur with  improper specimen collection/handling, submission of specimen other than nasopharyngeal swab, presence of viral mutation(s) within the areas targeted by this assay, and inadequate number of viral copies(<138 copies/mL). A negative result must be combined with clinical observations, patient history, and epidemiological information. The expected result is Negative.  Fact Sheet for Patients:  EntrepreneurPulse.com.au  Fact Sheet for Healthcare Providers:  IncredibleEmployment.be  This test is no t yet approved or cleared by the [place] FDA and  has been authorized for detection and/or diagnosis  of SARS-CoV-2 by FDA under an Emergency Use Authorization (EUA). This EUA will remain  in effect (meaning this test can be used) for the duration of the COVID-19 declaration under Section 564(b)(1) of the Act, 21 U.S.C.section 360bbb-3(b)(1), unless the authorization is terminated  or revoked sooner.       Influenza A by PCR NEGATIVE NEGATIVE Final   Influenza B by PCR NEGATIVE NEGATIVE Final    Comment: (NOTE) The Xpert Xpress SARS-CoV-2/FLU/RSV plus assay is intended as an aid in the diagnosis of influenza from Nasopharyngeal swab specimens and should not be used as a sole basis for treatment. Nasal washings and aspirates are unacceptable for Xpert Xpress SARS-CoV-2/FLU/RSV testing.  Fact Sheet for Patients: EntrepreneurPulse.com.au  Fact Sheet for Healthcare Providers: IncredibleEmployment.be  This test is not yet approved or  cleared by the [place] FDA and has been authorized for detection and/or diagnosis of SARS-CoV-2 by FDA under an Emergency Use Authorization (EUA). This EUA will remain in effect (meaning this test can be used) for the duration of the COVID-19 declaration under Section 564(b)(1) of the Act, 21 U.S.C. section 360bbb-3(b)(1), unless the authorization is terminated or revoked.  Performed at [facility], [place], [place] [address]   C Difficile Quick Screen w PCR reflex     Status: Abnormal   Collection Time: 10/30/21  8:52 AM   Specimen: STOOL  Result Value Ref Range Status   C Diff antigen POSITIVE (A) NEGATIVE Final   C Diff toxin NEGATIVE NEGATIVE Final   C Diff interpretation Results are indeterminate. See PCR results.  Final    Comment: Performed at [facility], [address] [place], [place] [address]  Gastrointestinal Panel by PCR , Stool     Status: None   Collection Time: 10/30/21  8:52 AM   Specimen: STOOL  Result Value Ref Range Status   Campylobacter species NOT DETECTED NOT DETECTED Final   Plesimonas shigelloides NOT DETECTED NOT DETECTED Final   Salmonella species NOT DETECTED NOT DETECTED Final   Yersinia enterocolitica NOT DETECTED NOT DETECTED Final   Vibrio species NOT DETECTED NOT DETECTED Final   Vibrio cholerae NOT DETECTED NOT DETECTED Final   Enteroaggregative E coli (EAEC) NOT DETECTED NOT DETECTED Final   Enteropathogenic E coli (EPEC) NOT DETECTED NOT DETECTED Final   Enterotoxigenic E coli (ETEC) NOT DETECTED NOT DETECTED Final   Shiga like toxin producing E coli (STEC) NOT DETECTED NOT DETECTED Final   Shigella/Enteroinvasive E coli (EIEC) NOT DETECTED NOT DETECTED Final   Cryptosporidium NOT DETECTED NOT DETECTED Final   Cyclospora cayetanensis NOT DETECTED NOT DETECTED Final   Entamoeba histolytica NOT DETECTED NOT DETECTED Final   Giardia lamblia NOT DETECTED NOT DETECTED Final   Adenovirus F40/41 NOT  DETECTED NOT DETECTED Final   Astrovirus NOT DETECTED NOT DETECTED Final   Norovirus GI/GII NOT DETECTED NOT DETECTED Final   Rotavirus A NOT DETECTED NOT DETECTED Final   Sapovirus (I, II, IV, and V) NOT DETECTED NOT DETECTED Final    Comment: Performed at [facility], [address] [place], [place] [address]  C. Diff by PCR, Reflexed     Status: None   Collection Time: 10/30/21  8:52 AM  Result Value Ref Range Status   Toxigenic C. Difficile by PCR NEGATIVE NEGATIVE Final    Comment: Patient is colonized with non toxigenic C. difficile. May not need treatment unless significant symptoms are present. Performed at [facility], [address],  [place], [place] [address]          Radiology Studies: No results found.      Scheduled Meds:  Chlorhexidine Gluconate Cloth  6 each Topical Daily   insulin aspart  0-5 Units Subcutaneous QHS   insulin aspart  0-9 Units Subcutaneous TID WC   insulin aspart  4 Units Subcutaneous TID WC   multivitamin with minerals  1 tablet Oral Daily   pantoprazole (PROTONIX) IV  40 mg Intravenous Q12H   vancomycin  125 mg Oral QID   Continuous Infusions:  0.9 % NaCl with KCl 20 mEq / L 75 mL/hr at 11/01/21 0333     LOS: 4 days    Time spent: 27 minutes    Sharen Hones, MD [facility]   To contact the attending provider between 7A-7P or the covering provider during after hours 7P-7A, please log into the web site [email or web address] and access using universal  password for that web site. If you do not have the password, please call the hospital operator.  11/01/2021, 12:44 PM

## 2021-11-01 NOTE — Progress Notes (Signed)
[facility]  ROUNDING NOTE   Subjective:   Patient sitting up in bed, just completed breakfast Alert and oriented Tolerating meals  Creatinine 3.98 (4.84) Urine output 3L recorded in past 24 hours  Objective:  Vital signs in last 24 hours:  Temp:  [98.4 F (36.9 C)-98.7 F (37.1 C)] 98.5 F (36.9 C) (01/16 0800) Pulse Rate:  [79-83] 79 (01/16 0800) Resp:  [16-18] 16 (01/16 0328) BP: (129-131)/(47-61) 131/47 (01/16 0800) SpO2:  [100 %] 100 % (01/16 0800) Weight:  [57.4 kg] 57.4 kg (01/16 0356)  Weight change: 2.6 kg Filed Weights   10/30/21 0616 10/31/21 0500 11/01/21 0356  Weight: 60.7 kg 54.8 kg 57.4 kg    Intake/Output: I/O last 3 completed shifts: In: 2249.2 [I.V.:2249.2] Out: 8250 [Urine:5675]   Intake/Output this shift:  No intake/output data recorded.  Physical Exam: General: NAD, resting in bed  Head: Normocephalic, atraumatic. Moist oral mucosal membranes  Eyes: Anicteric  Lungs:  Clear to auscultation, normal effort  Heart: Regular rate and rhythm  Abdomen:  Soft, nontender  Extremities:  no peripheral edema.  Neurologic: Nonfocal, moving all four extremities  Skin: No lesions       Basic Metabolic Panel: Recent Labs  Lab 10/28/21 0915 10/28/21 1816 10/29/21 0457 10/30/21 0618 10/31/21 0600 11/01/21 0632  NA 137  --  139 140 139 138  K 5.3* 4.6 3.7 3.4* 3.4* 4.2  CL 94*  --  95* 96* 101 106  CO2 <7*  --  25 33* 27 24  GLUCOSE 271*  --  243* 121* 187* 228*  BUN 99*  --  102* 92* 74* 60*  CREATININE 6.93*  --  6.65* 5.83* 4.84* 3.98*  CALCIUM 8.8*  --  7.6* 7.8* 7.8* 7.7*  MG  --   --   --   --  1.8  --   PHOS  --   --   --   --  2.9  --      Liver Function Tests: Recent Labs  Lab 10/28/21 0915  AST 44*  ALT 24  ALKPHOS 53  BILITOT 0.8  PROT 7.0  ALBUMIN 3.7    No results for input(s): LIPASE, AMYLASE in the last 168 hours. No results for input(s): AMMONIA in the last 168 hours.  CBC: Recent Labs  Lab  10/28/21 1816 10/29/21 0457 10/29/21 1835 10/30/21 0618 10/30/21 1825  WBC 13.3* 12.1* 8.5 5.7 6.0  HGB 9.3* 9.7* 10.0* 9.7* 10.2*  HCT 26.9* 27.5* 28.0* 27.1* 28.9*  MCV 86.8 83.8 83.1 82.6 85.3  PLT 245 239 227 196 194     Cardiac Enzymes: No results for input(s): CKTOTAL, CKMB, CKMBINDEX, TROPONINI in the last 168 hours.  BNP: Invalid input(s): POCBNP  CBG: Recent Labs  Lab 10/31/21 0829 10/31/21 1227 10/31/21 1619 10/31/21 2122 11/01/21 0808  GLUCAP 185* 317* 242* 183* 231*     Microbiology: Results for orders placed or performed during the hospital encounter of 10/28/21  Resp Panel by RT-PCR (Flu A&B, Covid) Nasopharyngeal Swab     Status: None   Collection Time: 10/28/21  9:17 AM   Specimen: Nasopharyngeal Swab; Nasopharyngeal(NP) swabs in vial transport medium  Result Value Ref Range Status   SARS Coronavirus 2 by RT PCR NEGATIVE NEGATIVE Final    Comment: (NOTE) SARS-CoV-2 target nucleic acids are NOT DETECTED.  The SARS-CoV-2 RNA is generally detectable in upper respiratory specimens during the acute phase of infection. The lowest concentration of SARS-CoV-2 viral copies this assay can detect  is 138 copies/mL. A negative result does not preclude SARS-Cov-2 infection and should not be used as the sole basis for treatment or other patient management decisions. A negative result may occur with  improper specimen collection/handling, submission of specimen other than nasopharyngeal swab, presence of viral mutation(s) within the areas targeted by this assay, and inadequate number of viral copies(<138 copies/mL). A negative result must be combined with clinical observations, patient history, and epidemiological information. The expected result is Negative.  Fact Sheet for Patients:  EntrepreneurPulse.com.au  Fact Sheet for Healthcare Providers:  IncredibleEmployment.be  This test is no t yet approved or cleared by the  [place] FDA and  has been authorized for detection and/or diagnosis of SARS-CoV-2 by FDA under an Emergency Use Authorization (EUA). This EUA will remain  in effect (meaning this test can be used) for the duration of the COVID-19 declaration under Section 564(b)(1) of the Act, 21 U.S.C.section 360bbb-3(b)(1), unless the authorization is terminated  or revoked sooner.       Influenza A by PCR NEGATIVE NEGATIVE Final   Influenza B by PCR NEGATIVE NEGATIVE Final    Comment: (NOTE) The Xpert Xpress SARS-CoV-2/FLU/RSV plus assay is intended as an aid in the diagnosis of influenza from Nasopharyngeal swab specimens and should not be used as a sole basis for treatment. Nasal washings and aspirates are unacceptable for Xpert Xpress SARS-CoV-2/FLU/RSV testing.  Fact Sheet for Patients: EntrepreneurPulse.com.au  Fact Sheet for Healthcare Providers: IncredibleEmployment.be  This test is not yet approved or cleared by the [place] FDA and has been authorized for detection and/or diagnosis of SARS-CoV-2 by FDA under an Emergency Use Authorization (EUA). This EUA will remain in effect (meaning this test can be used) for the duration of the COVID-19 declaration under Section 564(b)(1) of the Act, 21 U.S.C. section 360bbb-3(b)(1), unless the authorization is terminated or revoked.  Performed at [facility], [place], [place] [address]   C Difficile Quick Screen w PCR reflex     Status: Abnormal   Collection Time: 10/30/21  8:52 AM   Specimen: STOOL  Result Value Ref Range Status   C Diff antigen POSITIVE (A) NEGATIVE Final   C Diff toxin NEGATIVE NEGATIVE Final   C Diff interpretation Results are indeterminate. See PCR results.  Final    Comment: Performed at [facility], [address] [place], [place] [address]  Gastrointestinal Panel by PCR , Stool     Status: None   Collection Time: 10/30/21  8:52 AM    Specimen: STOOL  Result Value Ref Range Status   Campylobacter species NOT DETECTED NOT DETECTED Final   Plesimonas shigelloides NOT DETECTED NOT DETECTED Final   Salmonella species NOT DETECTED NOT DETECTED Final   Yersinia enterocolitica NOT DETECTED NOT DETECTED Final   Vibrio species NOT DETECTED NOT DETECTED Final   Vibrio cholerae NOT DETECTED NOT DETECTED Final   Enteroaggregative E coli (EAEC) NOT DETECTED NOT DETECTED Final   Enteropathogenic E coli (EPEC) NOT DETECTED NOT DETECTED Final   Enterotoxigenic E coli (ETEC) NOT DETECTED NOT DETECTED Final   Shiga like toxin producing E coli (STEC) NOT DETECTED NOT DETECTED Final   Shigella/Enteroinvasive E coli (EIEC) NOT DETECTED NOT DETECTED Final   Cryptosporidium NOT DETECTED NOT DETECTED Final   Cyclospora cayetanensis NOT DETECTED NOT DETECTED Final   Entamoeba histolytica NOT DETECTED NOT DETECTED Final   Giardia lamblia NOT DETECTED NOT DETECTED Final   Adenovirus F40/41 NOT DETECTED NOT DETECTED Final  Astrovirus NOT DETECTED NOT DETECTED Final   Norovirus GI/GII NOT DETECTED NOT DETECTED Final   Rotavirus A NOT DETECTED NOT DETECTED Final   Sapovirus (I, II, IV, and V) NOT DETECTED NOT DETECTED Final    Comment: Performed at [facility], [address], [place], [place] [address]  C. Diff by PCR, Reflexed     Status: None   Collection Time: 10/30/21  8:52 AM  Result Value Ref Range Status   Toxigenic C. Difficile by PCR NEGATIVE NEGATIVE Final    Comment: Patient is colonized with non toxigenic C. difficile. May not need treatment unless significant symptoms are present. Performed at [facility], [address] [place], [place] [address]     Coagulation Studies: No results for input(s): LABPROT, INR in the last 72 hours.   Urinalysis: Recent Labs    10/30/21 0852  COLORURINE STRAW*  LABSPEC 1.015  PHURINE 8.0  GLUCOSEU NEGATIVE  HGBUR SMALL*  BILIRUBINUR NEGATIVE  KETONESUR  NEGATIVE  PROTEINUR NEGATIVE  NITRITE NEGATIVE  LEUKOCYTESUR SMALL*       Imaging: US RENAL  Result Date: 10/30/2021 CLINICAL DATA:  Acute renal failure EXAM: RENAL / URINARY TRACT ULTRASOUND COMPLETE COMPARISON:  10/28/2021 FINDINGS: Right Kidney: Renal measurements: 11.2 x 4.6 x 6.3 cm = volume: 169 mL. Mild increased renal cortical echotexture. No hydronephrosis or renal mass. No evidence of nephrolithiasis. Left Kidney: Renal measurements: 10.8 x 5.4 by 4.1 cm = volume: 151 ML. Mild increased renal cortical echotexture. No hydronephrosis or renal mass. No evidence of nephrolithiasis. Bladder: The bladder is decompressed, limiting its evaluation. Other: None. IMPRESSION: 1. Mild bilateral increased renal cortical echotexture, consistent with medical renal disease. Otherwise unremarkable exam. Electronically Signed   By: Randa Ngo M.D.   On: 10/30/2021 15:28     Medications:    0.9 % NaCl with KCl 20 mEq / L 75 mL/hr at 11/01/21 0076    Chlorhexidine Gluconate Cloth  6 each Topical Daily   insulin aspart  0-5 Units Subcutaneous QHS   insulin aspart  0-9 Units Subcutaneous TID WC   insulin aspart  4 Units Subcutaneous TID WC   multivitamin with minerals  1 tablet Oral Daily   pantoprazole (PROTONIX) IV  40 mg Intravenous Q12H   vancomycin  125 mg Oral QID   acetaminophen, hydrALAZINE, ondansetron (ZOFRAN) IV  Assessment/ Plan:  [name] is a 76 y.o. black female diabetes mellitus type II, GERD, anemia, hypertension, lymphocytic colitis, and hyperlipidemia, who was admitted to [facility] on 10/28/2021 for GI bleeding [K92.2] Upper GI bleed [K92.2] AKI (acute kidney injury) ([place]) [N17.9] Acute renal failure, unspecified acute renal failure type ([place]) [N17.9] Gastrointestinal hemorrhage, unspecified gastrointestinal hemorrhage type [K92.2]   Acute kidney injury: with hyperkalemia and metabolic acidosis acute. Baseline creatinine unknown. Urinalysis negative for  proteinuria. Acute kidney injury secondary to prerenal azotemia with dehydration, poor PO intake, losartan and hydrochlorothiazide. Nonobstructive stone as incidental finding. No IV contrast exposure.  Now with hypokalemia - no indication for dialysis. Nonoliguric urine output.  - holding losartan and hydrochlorothiazide.  - Continue normal saline with 20KCl: 25mL/hr  Hypertension: Stable 131/47 Holding home blood pressure regimen of amlodipine, losartan, and hydrochlorothiazide. No indication to restart at this time.   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 Holding metformin. Glucose elevated    LOS: 4 [place] Pulse 1/16/202310:41 AM

## 2021-11-01 NOTE — Progress Notes (Signed)
Inpatient Diabetes Program Recommendations  AACE/ADA: New Consensus Statement on Inpatient Glycemic Control (2015)  Target Ranges:  Prepandial:   less than 140 mg/dL      Peak postprandial:   less than 180 mg/dL (1-2 hours)      Critically ill patients:  140 - 180 mg/dL    Latest Reference Range & Units 10/31/21 08:29 10/31/21 12:27 10/31/21 16:19 10/31/21 21:22  Glucose-Capillary 70 - 99 mg/dL 185 (H)  6 units Novolog  317 (H)  11 units Novolog  242 (H)  7 units Novolog  183 (H)    Latest Reference Range & Units 11/01/21 08:08  Glucose-Capillary 70 - 99 mg/dL 231 (H)  7 units Novolog   (H): Data is abnormally high   Home DM Meds: Glipizide 10 mg BID      Januvia 100 mg QD       Metformin 1000 mg BID    Current Orders: Novolog Sensitive Correction Scale/ SSI (0-9 units) TID AC + HS   Novolog 4 units TID with meals    MD- Please consider:  1. Start low dose Basal insulin: Semglee 5 units QHS (0.1 units/kg based on weight of 57 kg)  2. Increase Novolog Meal Coverage to 6 units TID with meals    --Will follow patient during hospitalization--  Wyn Quaker RN, MSN, CDE Diabetes Coordinator Inpatient Glycemic Control Team Team Pager: [phone] (8a-5p)

## 2021-11-02 DIAGNOSIS — N1832 Chronic kidney disease, stage 3b: Secondary | ICD-10-CM

## 2021-11-02 DIAGNOSIS — E1122 Type 2 diabetes mellitus with diabetic chronic kidney disease: Secondary | ICD-10-CM

## 2021-11-02 LAB — GLUCOSE, CAPILLARY
Glucose-Capillary: 152 mg/dL — ABNORMAL HIGH (ref 70–99)
Glucose-Capillary: 177 mg/dL — ABNORMAL HIGH (ref 70–99)
Glucose-Capillary: 332 mg/dL — ABNORMAL HIGH (ref 70–99)
Glucose-Capillary: 271 mg/dL — ABNORMAL HIGH (ref 70–99)
Glucose-Capillary: 98 mg/dL (ref 70–99)

## 2021-11-02 LAB — ANCA PROFILE
Anti-MPO Antibodies: <0.2 units (ref 0.0–0.9)
Anti-PR3 Antibodies: <0.2 units (ref 0.0–0.9)
Atypical P-ANCA titer: <1:20 {titer}
C-ANCA: <1:20 {titer}
P-ANCA: <1:20 {titer}

## 2021-11-02 LAB — PROTEIN ELECTRO, RANDOM URINE
Albumin ELP, Urine: 22.7 %
Alpha-1-Globulin, U: 10.7 %
Alpha-2-Globulin, U: 29.3 %
Beta Globulin, U: 25.2 %
Gamma Globulin, U: 12.1 %
Total Protein, Urine: 8.6 mg/dL

## 2021-11-02 LAB — BASIC METABOLIC PANEL
Anion gap: 7 (ref 5–15)
BUN: 53 mg/dL — ABNORMAL HIGH (ref 8–23)
CO2: 23 mmol/L (ref 22–32)
Calcium: 8 mg/dL — ABNORMAL LOW (ref 8.9–10.3)
Chloride: 107 mmol/L (ref 98–111)
Creatinine, Ser: 2.98 mg/dL — ABNORMAL HIGH (ref 0.44–1.00)
GFR, Estimated: 16 mL/min — ABNORMAL LOW (ref >60)
Glucose, Bld: 144 mg/dL — ABNORMAL HIGH (ref 70–99)
Potassium: 4.1 mmol/L (ref 3.5–5.1)
Sodium: 137 mmol/L (ref 135–145)

## 2021-11-02 LAB — H. PYLORI ANTIGEN, STOOL: H. Pylori Stool Ag, Eia: NEGATIVE

## 2021-11-02 NOTE — Progress Notes (Addendum)
[facility]  ROUNDING NOTE   Subjective:   Patient sitting up in bed Alert and oriented Tolerating meals Ambulating with assistance in room  Creatinine 2.98 (3.98) (4.84) Urine output 3.1L recorded in past 24 hours  Objective:  Vital signs in last 24 hours:  Temp:  [98 F (36.7 C)-98.3 F (36.8 C)] 98.2 F (36.8 C) (01/17 0828) Pulse Rate:  [71-79] 71 (01/17 0828) Resp:  [16-18] 16 (01/17 0828) BP: (122-157)/(57-62) 139/57 (01/17 0828) SpO2:  [96 %-100 %] 97 % (01/17 0828)  Weight change:  Filed Weights   10/30/21 0616 10/31/21 0500 11/01/21 0356  Weight: 60.7 kg 54.8 kg 57.4 kg    Intake/Output: I/O last 3 completed shifts: In: 604.9 [I.V.:604.9] Out: 4454 [Urine:4454]   Intake/Output this shift:  No intake/output data recorded.  Physical Exam: General: NAD, resting in bed  Head: Normocephalic, atraumatic. Moist oral mucosal membranes  Eyes: Anicteric  Lungs:  Clear to auscultation, normal effort  Heart: Regular rate and rhythm  Abdomen:  Soft, nontender  Extremities:  no peripheral edema.  Neurologic: Nonfocal, moving all four extremities  Skin: No lesions  GU  Foley    Basic Metabolic Panel: Recent Labs  Lab 10/29/21 0457 10/30/21 0618 10/31/21 0600 11/01/21 0632 11/02/21 0456  NA 139 140 139 138 137  K 3.7 3.4* 3.4* 4.2 4.1  CL 95* 96* 101 106 107  CO2 25 33* 27 24 23   GLUCOSE 243* 121* 187* 228* 144*  BUN 102* 92* 74* 60* 53*  CREATININE 6.65* 5.83* 4.84* 3.98* 2.98*  CALCIUM 7.6* 7.8* 7.8* 7.7* 8.0*  MG  --   --  1.8  --   --   PHOS  --   --  2.9  --   --      Liver Function Tests: Recent Labs  Lab 10/28/21 0915  AST 44*  ALT 24  ALKPHOS 53  BILITOT 0.8  PROT 7.0  ALBUMIN 3.7    No results for input(s): LIPASE, AMYLASE in the last 168 hours. No results for input(s): AMMONIA in the last 168 hours.  CBC: Recent Labs  Lab 10/28/21 1816 10/29/21 0457 10/29/21 1835 10/30/21 0618 10/30/21 1825  WBC 13.3*  12.1* 8.5 5.7 6.0  HGB 9.3* 9.7* 10.0* 9.7* 10.2*  HCT 26.9* 27.5* 28.0* 27.1* 28.9*  MCV 86.8 83.8 83.1 82.6 85.3  PLT 245 239 227 196 194     Cardiac Enzymes: No results for input(s): CKTOTAL, CKMB, CKMBINDEX, TROPONINI in the last 168 hours.  BNP: Invalid input(s): POCBNP  CBG: Recent Labs  Lab 11/01/21 1109 11/01/21 1614 11/01/21 2055 11/02/21 0751 11/02/21 0826  GLUCAP 327* 187* 164* 177* 152*     Microbiology: Results for orders placed or performed during the hospital encounter of 10/28/21  Resp Panel by RT-PCR (Flu A&B, Covid) Nasopharyngeal Swab     Status: None   Collection Time: 10/28/21  9:17 AM   Specimen: Nasopharyngeal Swab; Nasopharyngeal(NP) swabs in vial transport medium  Result Value Ref Range Status   SARS Coronavirus 2 by RT PCR NEGATIVE NEGATIVE Final    Comment: (NOTE) SARS-CoV-2 target nucleic acids are NOT DETECTED.  The SARS-CoV-2 RNA is generally detectable in upper respiratory specimens during the acute phase of infection. The lowest concentration of SARS-CoV-2 viral copies this assay can detect is 138 copies/mL. A negative result does not preclude SARS-Cov-2 infection and should not be used as the sole basis for treatment or other patient management decisions. A negative result may occur with  improper specimen collection/handling, submission of specimen other than nasopharyngeal swab, presence of viral mutation(s) within the areas targeted by this assay, and inadequate number of viral copies(<138 copies/mL). A negative result must be combined with clinical observations, patient history, and epidemiological information. The expected result is Negative.  Fact Sheet for Patients:  EntrepreneurPulse.com.au  Fact Sheet for Healthcare Providers:  IncredibleEmployment.be  This test is no t yet approved or cleared by the [place] FDA and  has been authorized for detection and/or diagnosis of  SARS-CoV-2 by FDA under an Emergency Use Authorization (EUA). This EUA will remain  in effect (meaning this test can be used) for the duration of the COVID-19 declaration under Section 564(b)(1) of the Act, 21 U.S.C.section 360bbb-3(b)(1), unless the authorization is terminated  or revoked sooner.       Influenza A by PCR NEGATIVE NEGATIVE Final   Influenza B by PCR NEGATIVE NEGATIVE Final    Comment: (NOTE) The Xpert Xpress SARS-CoV-2/FLU/RSV plus assay is intended as an aid in the diagnosis of influenza from Nasopharyngeal swab specimens and should not be used as a sole basis for treatment. Nasal washings and aspirates are unacceptable for Xpert Xpress SARS-CoV-2/FLU/RSV testing.  Fact Sheet for Patients: EntrepreneurPulse.com.au  Fact Sheet for Healthcare Providers: IncredibleEmployment.be  This test is not yet approved or cleared by the [place] FDA and has been authorized for detection and/or diagnosis of SARS-CoV-2 by FDA under an Emergency Use Authorization (EUA). This EUA will remain in effect (meaning this test can be used) for the duration of the COVID-19 declaration under Section 564(b)(1) of the Act, 21 U.S.C. section 360bbb-3(b)(1), unless the authorization is terminated or revoked.  Performed at [facility], [place], [place] [address]   C Difficile Quick Screen w PCR reflex     Status: Abnormal   Collection Time: 10/30/21  8:52 AM   Specimen: STOOL  Result Value Ref Range Status   C Diff antigen POSITIVE (A) NEGATIVE Final   C Diff toxin NEGATIVE NEGATIVE Final   C Diff interpretation Results are indeterminate. See PCR results.  Final    Comment: Performed at [facility], [address] [place], [place] [address]  Gastrointestinal Panel by PCR , Stool     Status: None   Collection Time: 10/30/21  8:52 AM   Specimen: STOOL  Result Value Ref Range Status   Campylobacter species  NOT DETECTED NOT DETECTED Final   Plesimonas shigelloides NOT DETECTED NOT DETECTED Final   Salmonella species NOT DETECTED NOT DETECTED Final   Yersinia enterocolitica NOT DETECTED NOT DETECTED Final   Vibrio species NOT DETECTED NOT DETECTED Final   Vibrio cholerae NOT DETECTED NOT DETECTED Final   Enteroaggregative E coli (EAEC) NOT DETECTED NOT DETECTED Final   Enteropathogenic E coli (EPEC) NOT DETECTED NOT DETECTED Final   Enterotoxigenic E coli (ETEC) NOT DETECTED NOT DETECTED Final   Shiga like toxin producing E coli (STEC) NOT DETECTED NOT DETECTED Final   Shigella/Enteroinvasive E coli (EIEC) NOT DETECTED NOT DETECTED Final   Cryptosporidium NOT DETECTED NOT DETECTED Final   Cyclospora cayetanensis NOT DETECTED NOT DETECTED Final   Entamoeba histolytica NOT DETECTED NOT DETECTED Final   Giardia lamblia NOT DETECTED NOT DETECTED Final   Adenovirus F40/41 NOT DETECTED NOT DETECTED Final   Astrovirus NOT DETECTED NOT DETECTED Final   Norovirus GI/GII NOT DETECTED NOT DETECTED Final   Rotavirus A NOT DETECTED NOT DETECTED Final   Sapovirus (I, II, IV, and V) NOT  DETECTED NOT DETECTED Final    Comment: Performed at [facility], [address] [place], [place] [address]  C. Diff by PCR, Reflexed     Status: None   Collection Time: 10/30/21  8:52 AM  Result Value Ref Range Status   Toxigenic C. Difficile by PCR NEGATIVE NEGATIVE Final    Comment: Patient is colonized with non toxigenic C. difficile. May not need treatment unless significant symptoms are present. Performed at [facility], [address] [place], [place] [address]     Coagulation Studies: No results for input(s): LABPROT, INR in the last 72 hours.   Urinalysis: No results for input(s): COLORURINE, LABSPEC, PHURINE, GLUCOSEU, HGBUR, BILIRUBINUR, KETONESUR, PROTEINUR, UROBILINOGEN, NITRITE, LEUKOCYTESUR in the last 72 hours.  Invalid input(s): APPERANCEUR     Imaging: No results  found.   Medications:      Chlorhexidine Gluconate Cloth  6 each Topical Daily   insulin aspart  0-5 Units Subcutaneous QHS   insulin aspart  0-9 Units Subcutaneous TID WC   insulin aspart  4 Units Subcutaneous TID WC   insulin detemir  6 Units Subcutaneous Daily   multivitamin with minerals  1 tablet Oral Daily   pantoprazole (PROTONIX) IV  40 mg Intravenous Q12H   vancomycin  125 mg Oral QID   acetaminophen, hydrALAZINE, ondansetron (ZOFRAN) IV  Assessment/ Plan:  Ms. JERSIE BEEL is a 76 y.o. black female diabetes mellitus type II, GERD, anemia, hypertension, lymphocytic colitis, and hyperlipidemia, who was admitted to [facility] on 10/28/2021 for GI bleeding [K92.2] Upper GI bleed [K92.2] AKI (acute kidney injury) ([place]) [N17.9] Acute renal failure, unspecified acute renal failure type ([place]) [N17.9] Gastrointestinal hemorrhage, unspecified gastrointestinal hemorrhage type [K92.2]   Acute kidney injury: with hyperkalemia and metabolic acidosis acute. Baseline creatinine unknown. Urinalysis negative for proteinuria. Acute kidney injury secondary to prerenal azotemia with dehydration, poor PO intake, losartan and hydrochlorothiazide. Nonobstructive stone as incidental finding. No IV contrast exposure.  Now with hypokalemia - no indication for dialysis. Nonoliguric urine output.  - holding losartan and hydrochlorothiazide.  - Creatinine continues to improve with adequate urine output -Prefer to monitor for additional day and will consider discharge with outpatient follow up tomorrow.   Hypertension: Stable 139/57 Holding home blood pressure regimen of amlodipine, losartan, and hydrochlorothiazide. No indication to restart at this time.   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 Holding metformin. Glucose elevated to 332 this am    Patricia Cross 1/17/202312:07 PM

## 2021-11-02 NOTE — Progress Notes (Signed)
Inpatient Diabetes Program Recommendations  AACE/ADA: New Consensus Statement on Inpatient Glycemic Control (2015)  Target Ranges:  Prepandial:   less than 140 mg/dL      Peak postprandial:   less than 180 mg/dL (1-2 hours)      Critically ill patients:  140 - 180 mg/dL    Latest Reference Range & Units 11/01/21 08:08 11/01/21 11:09 11/01/21 16:14 11/01/21 20:55  Glucose-Capillary 70 - 99 mg/dL 231 (H)  7 units Novolog  327 (H)  11 units Novolog  6 units Semglee @1346   187 (H)  6 units Novolog  164 (H)    Latest Reference Range & Units 11/02/21 08:26 11/02/21 12:04  Glucose-Capillary 70 - 99 mg/dL 152 (H)  6 units Novolog  332 (H)  11 units Novolog       Home DM Meds: Glipizide 10 mg BID    Januvia 100 mg daily   Metformin 1000 mg BID    Current Orders: Novolog 0-9 units TID ac/hs Novolog 4 units TID with meals Semglee 6 units daily     MD- Note Semglee started yesterday AM.  AM CBG better this morning.  Still having issues with elevated afternoon CBGs.  Please consider increasing the Novolog Meal Coverage to 6 units TID with meals    --Will follow patient during hospitalization--  Wyn Quaker RN, MSN, CDE Diabetes Coordinator Inpatient Glycemic Control Team Team Pager: [phone] (8a-5p)

## 2021-11-02 NOTE — Progress Notes (Signed)
PROGRESS NOTE    Patricia Cross  ERD:[id number] DOB: 1946-06-13 DOA: 10/28/2021 PCP: Perrin Maltese, MD    Brief Narrative:  Patricia Cross is a 76 y.o. female with medical history significant of lymphocytic colitis, hypertension, hyperlipidemia, diabetes mellitus, GERD, anemia, who presents with black stool, nausea, vomiting, diarrhea. Hemoglobin has been stable, but patient was found to have acute kidney injury with creatinine of 6.93, bicarb less than 7. Patient was followed by nephrology, started on bicarb drip.  Renal function gradually improving.   Assessment & Plan:   Principal Problem:   GI bleeding Active Problems:   Hypertension   Type II diabetes mellitus with renal manifestations (HCC)   AKI (acute kidney injury) ([place])   Hyperkalemia   Right ureteral stone   Leukocytosis   Nausea vomiting and diarrhea   Normocytic anemia   HLD (hyperlipidemia)   C. difficile colitis     Acute kidney injury secondary to ATN. Severe metabolic acidosis. Hyperkalemia. Hypokalemia Patient does not have a kidney stone on ultrasound. Patient has been receiving fluids, condition gradually improving. Patient baseline renal function is unknown, may discharge patient home when renal function reaches a stable condition.  Uncontrolled type 2 diabetes with hyperglycemia Hemoglobin A1c 8.2.  Continue Levemir, NovoLog and sliding scale insulin.  Patient may need home insulin at time of discharge.   C. difficile colitis. Melena with diarrhea. Condition had improved.  Continue to finish 10 days of oral vancomycin.   Anemia of chronic kidney disease. Patient has adequate iron, B12 level.     DVT prophylaxis: SCDs Code Status: full Family Communication:  Disposition Plan: Home in 1 to 2 days     Status is: Inpatient   Remains inpatient appropriate because: Severity of disease, IV treatment.     I/O last 3 completed shifts: In: 604.9 [I.V.:604.9] Out: 4454  [Urine:4454] No intake/output data recorded.     Consultants:  Nephrology  Procedures: None  Antimicrobials: Oral vancomycin Subjective: Patient doing well.  She had 2 soft stool yesterday.  No diarrhea.  No nausea vomiting abdominal pain. She has good appetite  Denies any short of breath or cough. No dysuria hematuria.  Objective: Vitals:   11/01/21 0800 11/01/21 2007 11/02/21 0436 11/02/21 0828  BP: (!) 131/47 (!) 157/57 122/62 (!) 139/57  Pulse: 79 79 78 71  Resp:  18 18 16   Temp: 98.5 F (36.9 C) 98.3 F (36.8 C) 98 F (36.7 C) 98.2 F (36.8 C)  TempSrc: Oral Oral    SpO2: 100% 100% 96% 97%  Weight:      Height:        Intake/Output Summary (Last 24 hours) at 11/02/2021 1027 Last data filed at 11/01/2021 2047 Gross per 24 hour  Intake 0 ml  Output 3154 ml  Net -3154 ml   Filed Weights   10/30/21 0616 10/31/21 0500 11/01/21 0356  Weight: 60.7 kg 54.8 kg 57.4 kg    Examination:  General exam: Appears calm and comfortable  Respiratory system: Clear to auscultation. Respiratory effort normal. Cardiovascular system: S1 & S2 heard, RRR. No JVD, murmurs, rubs, gallops or clicks. No pedal edema. Gastrointestinal system: Abdomen is nondistended, soft and nontender. No organomegaly or masses felt. Normal bowel sounds heard. Central nervous system: Alert and oriented. No focal neurological deficits. Extremities: Symmetric 5 x 5 power. Skin: No rashes, lesions or ulcers Psychiatry: Judgement and insight appear normal. Mood & affect appropriate.     Data Reviewed: I have personally reviewed following labs and  imaging studies  CBC: Recent Labs  Lab 10/28/21 1816 10/29/21 0457 10/29/21 1835 10/30/21 0618 10/30/21 1825  WBC 13.3* 12.1* 8.5 5.7 6.0  HGB 9.3* 9.7* 10.0* 9.7* 10.2*  HCT 26.9* 27.5* 28.0* 27.1* 28.9*  MCV 86.8 83.8 83.1 82.6 85.3  PLT 245 239 227 196 161   Basic Metabolic Panel: Recent Labs  Lab 10/29/21 0457 10/30/21 0618 10/31/21 0600  11/01/21 0632 11/02/21 0456  NA 139 140 139 138 137  K 3.7 3.4* 3.4* 4.2 4.1  CL 95* 96* 101 106 107  CO2 25 33* 27 24 23   GLUCOSE 243* 121* 187* 228* 144*  BUN 102* 92* 74* 60* 53*  CREATININE 6.65* 5.83* 4.84* 3.98* 2.98*  CALCIUM 7.6* 7.8* 7.8* 7.7* 8.0*  MG  --   --  1.8  --   --   PHOS  --   --  2.9  --   --    GFR: Estimated Creatinine Clearance: 12.9 mL/min (A) (by C-G formula based on SCr of 2.98 mg/dL (H)). Liver Function Tests: Recent Labs  Lab 10/28/21 0915  AST 44*  ALT 24  ALKPHOS 53  BILITOT 0.8  PROT 7.0  ALBUMIN 3.7   No results for input(s): LIPASE, AMYLASE in the last 168 hours. No results for input(s): AMMONIA in the last 168 hours. Coagulation Profile: Recent Labs  Lab 10/28/21 0915  INR 1.4*   Cardiac Enzymes: No results for input(s): CKTOTAL, CKMB, CKMBINDEX, TROPONINI in the last 168 hours. BNP (last 3 results) No results for input(s): PROBNP in the last 8760 hours. HbA1C: No results for input(s): HGBA1C in the last 72 hours. CBG: Recent Labs  Lab 11/01/21 1109 11/01/21 1614 11/01/21 2055 11/02/21 0751 11/02/21 0826  GLUCAP 327* 187* 164* 177* 152*   Lipid Profile: No results for input(s): CHOL, HDL, LDLCALC, TRIG, CHOLHDL, LDLDIRECT in the last 72 hours. Thyroid Function Tests: No results for input(s): TSH, T4TOTAL, FREET4, T3FREE, THYROIDAB in the last 72 hours. Anemia Panel: No results for input(s): VITAMINB12, FOLATE, FERRITIN, TIBC, IRON, RETICCTPCT in the last 72 hours. Sepsis Labs: No results for input(s): PROCALCITON, LATICACIDVEN in the last 168 hours.  Recent Results (from the past 240 hour(s))  Resp Panel by RT-PCR (Flu A&B, Covid) Nasopharyngeal Swab     Status: None   Collection Time: 10/28/21  9:17 AM   Specimen: Nasopharyngeal Swab; Nasopharyngeal(NP) swabs in vial transport medium  Result Value Ref Range Status   SARS Coronavirus 2 by RT PCR NEGATIVE NEGATIVE Final    Comment: (NOTE) SARS-CoV-2 target nucleic  acids are NOT DETECTED.  The SARS-CoV-2 RNA is generally detectable in upper respiratory specimens during the acute phase of infection. The lowest concentration of SARS-CoV-2 viral copies this assay can detect is 138 copies/mL. A negative result does not preclude SARS-Cov-2 infection and should not be used as the sole basis for treatment or other patient management decisions. A negative result may occur with  improper specimen collection/handling, submission of specimen other than nasopharyngeal swab, presence of viral mutation(s) within the areas targeted by this assay, and inadequate number of viral copies(<138 copies/mL). A negative result must be combined with clinical observations, patient history, and epidemiological information. The expected result is Negative.  Fact Sheet for Patients:  EntrepreneurPulse.com.au  Fact Sheet for Healthcare Providers:  IncredibleEmployment.be  This test is no t yet approved or cleared by the [place] FDA and  has been authorized for detection and/or diagnosis of SARS-CoV-2 by FDA under an Emergency Use Authorization (EUA).  This EUA will remain  in effect (meaning this test can be used) for the duration of the COVID-19 declaration under Section 564(b)(1) of the Act, 21 U.S.C.section 360bbb-3(b)(1), unless the authorization is terminated  or revoked sooner.       Influenza A by PCR NEGATIVE NEGATIVE Final   Influenza B by PCR NEGATIVE NEGATIVE Final    Comment: (NOTE) The Xpert Xpress SARS-CoV-2/FLU/RSV plus assay is intended as an aid in the diagnosis of influenza from Nasopharyngeal swab specimens and should not be used as a sole basis for treatment. Nasal washings and aspirates are unacceptable for Xpert Xpress SARS-CoV-2/FLU/RSV testing.  Fact Sheet for Patients: EntrepreneurPulse.com.au  Fact Sheet for Healthcare Providers: IncredibleEmployment.be  This  test is not yet approved or cleared by the [place] FDA and has been authorized for detection and/or diagnosis of SARS-CoV-2 by FDA under an Emergency Use Authorization (EUA). This EUA will remain in effect (meaning this test can be used) for the duration of the COVID-19 declaration under Section 564(b)(1) of the Act, 21 U.S.C. section 360bbb-3(b)(1), unless the authorization is terminated or revoked.  Performed at [facility], [place], [place] [address]   C Difficile Quick Screen w PCR reflex     Status: Abnormal   Collection Time: 10/30/21  8:52 AM   Specimen: STOOL  Result Value Ref Range Status   C Diff antigen POSITIVE (A) NEGATIVE Final   C Diff toxin NEGATIVE NEGATIVE Final   C Diff interpretation Results are indeterminate. See PCR results.  Final    Comment: Performed at [facility], [address] [place], [place] [address]  Gastrointestinal Panel by PCR , Stool     Status: None   Collection Time: 10/30/21  8:52 AM   Specimen: STOOL  Result Value Ref Range Status   Campylobacter species NOT DETECTED NOT DETECTED Final   Plesimonas shigelloides NOT DETECTED NOT DETECTED Final   Salmonella species NOT DETECTED NOT DETECTED Final   Yersinia enterocolitica NOT DETECTED NOT DETECTED Final   Vibrio species NOT DETECTED NOT DETECTED Final   Vibrio cholerae NOT DETECTED NOT DETECTED Final   Enteroaggregative E coli (EAEC) NOT DETECTED NOT DETECTED Final   Enteropathogenic E coli (EPEC) NOT DETECTED NOT DETECTED Final   Enterotoxigenic E coli (ETEC) NOT DETECTED NOT DETECTED Final   Shiga like toxin producing E coli (STEC) NOT DETECTED NOT DETECTED Final   Shigella/Enteroinvasive E coli (EIEC) NOT DETECTED NOT DETECTED Final   Cryptosporidium NOT DETECTED NOT DETECTED Final   Cyclospora cayetanensis NOT DETECTED NOT DETECTED Final   Entamoeba histolytica NOT DETECTED NOT DETECTED Final   Giardia lamblia NOT DETECTED NOT DETECTED Final    Adenovirus F40/41 NOT DETECTED NOT DETECTED Final   Astrovirus NOT DETECTED NOT DETECTED Final   Norovirus GI/GII NOT DETECTED NOT DETECTED Final   Rotavirus A NOT DETECTED NOT DETECTED Final   Sapovirus (I, II, IV, and V) NOT DETECTED NOT DETECTED Final    Comment: Performed at [facility], [address] [place], [place] [address]  C. Diff by PCR, Reflexed     Status: None   Collection Time: 10/30/21  8:52 AM  Result Value Ref Range Status   Toxigenic C. Difficile by PCR NEGATIVE NEGATIVE Final    Comment: Patient is colonized with non toxigenic C. difficile. May not need treatment unless significant symptoms are present. Performed at [facility], [address], [place], [place] [address]  Radiology Studies: No results found.      Scheduled Meds:  Chlorhexidine Gluconate Cloth  6 each Topical Daily   insulin aspart  0-5 Units Subcutaneous QHS   insulin aspart  0-9 Units Subcutaneous TID WC   insulin aspart  4 Units Subcutaneous TID WC   insulin detemir  6 Units Subcutaneous Daily   multivitamin with minerals  1 tablet Oral Daily   pantoprazole (PROTONIX) IV  40 mg Intravenous Q12H   vancomycin  125 mg Oral QID   Continuous Infusions:  0.9 % NaCl with KCl 20 mEq / L 75 mL/hr at 11/01/21 2002     LOS: 5 days    Time spent: 27 minutes    Sharen Hones, MD [facility]   To contact the attending provider between 7A-7P or the covering provider during after hours 7P-7A, please log into the web site [email or web address] and access using universal [place] password for that web site. If you do not have the password, please call the hospital operator.  11/02/2021, 10:27 AM

## 2021-11-03 DIAGNOSIS — K921 Melena: Secondary | ICD-10-CM

## 2021-11-03 DIAGNOSIS — D638 Anemia in other chronic diseases classified elsewhere: Secondary | ICD-10-CM

## 2021-11-03 DIAGNOSIS — D649 Anemia, unspecified: Secondary | ICD-10-CM

## 2021-11-03 DIAGNOSIS — E1165 Type 2 diabetes mellitus with hyperglycemia: Secondary | ICD-10-CM

## 2021-11-03 LAB — RENAL FUNCTION PANEL
Albumin: 2.8 g/dL — ABNORMAL LOW (ref 3.5–5.0)
Anion gap: 6 (ref 5–15)
BUN: 43 mg/dL — ABNORMAL HIGH (ref 8–23)
CO2: 23 mmol/L (ref 22–32)
Calcium: 8.5 mg/dL — ABNORMAL LOW (ref 8.9–10.3)
Chloride: 104 mmol/L (ref 98–111)
Creatinine, Ser: 2.57 mg/dL — ABNORMAL HIGH (ref 0.44–1.00)
GFR, Estimated: 19 mL/min — ABNORMAL LOW (ref >60)
Glucose, Bld: 246 mg/dL — ABNORMAL HIGH (ref 70–99)
Phosphorus: 2.9 mg/dL (ref 2.5–4.6)
Potassium: 3.5 mmol/L (ref 3.5–5.1)
Sodium: 133 mmol/L — ABNORMAL LOW (ref 135–145)

## 2021-11-03 LAB — GLUCOSE, CAPILLARY
Glucose-Capillary: 185 mg/dL — ABNORMAL HIGH (ref 70–99)
Glucose-Capillary: 368 mg/dL — ABNORMAL HIGH (ref 70–99)

## 2021-11-03 MED ORDER — GLIPIZIDE ER 5 MG PO TB24: 5.0000 mg | ORAL_TABLET | Freq: Every day | ORAL | 0 refills | Status: DC

## 2021-11-03 MED ORDER — SITAGLIPTIN PHOSPHATE 50 MG PO TABS: 50.0000 mg | ORAL_TABLET | Freq: Every day | ORAL | 0 refills | Status: DC

## 2021-11-03 MED ORDER — AMLODIPINE BESYLATE 5 MG PO TABS: 5.0000 mg | ORAL_TABLET | Freq: Every day | ORAL | 0 refills | Status: DC

## 2021-11-03 MED ORDER — PANTOPRAZOLE SODIUM 40 MG PO TBEC: 40.0000 mg | DELAYED_RELEASE_TABLET | Freq: Every day | ORAL | 0 refills | Status: AC

## 2021-11-03 MED ORDER — VANCOMYCIN HCL 125 MG PO CAPS: 125.0000 mg | ORAL_CAPSULE | Freq: Four times a day (QID) | ORAL | 0 refills | Status: AC

## 2021-11-03 NOTE — Care Management Important Message (Signed)
Important Message  Patient Details  Name: Patricia Cross MRN: [id number] Date of Birth: December 03, 1945   Medicare Important Message Given:  Yes  Medicare IM reviewed with patient via room phone due to isolation status.  Also reviewed with son via phone per patient's request.  Copy of Medicare IM to be delivered to patient's room via nursing staff.    Dannette Barbara 11/03/2021, 10:27 AM

## 2021-11-03 NOTE — Discharge Summary (Signed)
[facility] NAME: Patricia Cross    MR#:  [id number]  DATE OF BIRTH:  Feb 12, 1946  DATE OF ADMISSION:  10/28/2021 ADMITTING PHYSICIAN: Ivor Costa, MD  DATE OF DISCHARGE: 11/03/2021  2:11 PM  PRIMARY CARE PHYSICIAN: Perrin Maltese, MD    ADMISSION DIAGNOSIS:  GI bleeding [K92.2] Upper GI bleed [K92.2] AKI (acute kidney injury) ([place]) [N17.9] Acute renal failure, unspecified acute renal failure type ([place]) [N17.9] Gastrointestinal hemorrhage, unspecified gastrointestinal hemorrhage type [K92.2]  DISCHARGE DIAGNOSIS:  1.  Acute kidney injury secondary to ATN 2.  Severe metabolic acidosis 3.  Hyperkalemia 4.  Type 2 diabetes mellitus with hyperglycemia 5.  Suspected C. difficile colitis 6.  Melena and diarrhea 7.  Anemia of chronic disease  SECONDARY DIAGNOSIS:   Past Medical History:  Diagnosis Date   Anemia    Diabetes mellitus without complication ([place]) 6195   High cholesterol    Hypertension     HOSPITAL COURSE:   1.  Acute kidney injury secondary to ATN, severe metabolic acidosis and initial hyperkalemia.  The patient's initial creatinine was 6.93 and potassium 5.4.  Upon discharge creatinine 2.57 and potassium 3.5.  Unknown baseline creatinine.  The patient was seen by nephrology and recommended outpatient follow-up with Dr. Holley Raring.  Continue to hold losartan and hydrochlorothiazide.  Renal ultrasound showed mild bilateral increased renal cortical echotexture consistent with medical renal disease. 2.  Type 2 diabetes mellitus with hyperglycemia.  Hemoglobin A1c 8.2.  The patient was started on Levemir insulin and NovoLog insulin.  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  Can go back on renally dosed sitagliptin.  We will restart Glucotrol XL low-dose.  Continue to monitor sugars as outpatient.  As kidney function improves can probably go up on the Glucotrol XL dose.  Holding metformin at this  point. 3.  Suspected C. difficile colitis.  C. difficile testing was negative.  The patient did have diarrhea.  Patient was empirically started on oral vancomycin and seemed to improved.  We will finish up total of 10 days of oral vancomycin. 4.  Melena.  Seen in consultation by Dr. Virgina Jock gastroenterology and they decided not to do any endoscopy at this point in time.  We will follow-up with GI as outpatient.  Continue Protonix. 5.  Anemia of chronic disease.  Hemoglobin globin upon discharge 10.2.  DISCHARGE CONDITIONS:   Satisfactory  CONSULTS OBTAINED:  Nephrology Gastroenterology  DRUG ALLERGIES:  No Known Allergies  DISCHARGE MEDICATIONS:   Allergies as of 11/03/2021   No Known Allergies      Medication List     STOP taking these medications    glipiZIDE 10 MG tablet Commonly known as: GLUCOTROL Replaced by: glipiZIDE 5 MG 24 hr tablet   hydrochlorothiazide 25 MG tablet Commonly known as: HYDRODIURIL   losartan 100 MG tablet Commonly known as: COZAAR   metFORMIN 1000 MG tablet Commonly known as: GLUCOPHAGE   Onglyza 5 MG Tabs tablet Generic drug: saxagliptin HCl   rosuvastatin 40 MG tablet Commonly known as: CRESTOR   Suprep Bowel Prep Kit 17.5-3.13-1.6 GM/177ML Soln Generic drug: Na Sulfate-K Sulfate-Mg Sulf       TAKE these medications    amLODipine 5 MG tablet Commonly known as: NORVASC Take 1 tablet (5 mg total) by mouth daily. What changed:  medication strength how much to take   glipiZIDE 5 MG 24 hr tablet Commonly known as: Glucotrol XL Take  1 tablet (5 mg total) by mouth daily with breakfast. Replaces: glipiZIDE 10 MG tablet   multivitamin tablet Take 1 tablet by mouth daily.   pantoprazole 40 MG tablet Commonly known as: PROTONIX Take 1 tablet (40 mg total) by mouth daily.   sitaGLIPtin 50 MG tablet Commonly known as: Januvia Take 1 tablet (50 mg total) by mouth daily. What changed:  medication strength how much to take    vancomycin 125 MG capsule Commonly known as: VANCOCIN Take 1 capsule (125 mg total) by mouth 4 (four) times daily for 7 days.         DISCHARGE INSTRUCTIONS:   Follow-up PCP 5 days Follow-up nephrology 1 week Follow-up gastroenterology 3 weeks  If you experience worsening of your admission symptoms, develop shortness of breath, life threatening emergency, suicidal or homicidal thoughts you must seek medical attention immediately by calling 911 or calling your MD immediately  if symptoms less severe.  You Must read complete instructions/literature along with all the possible adverse reactions/side effects for all the Medicines you take and that have been prescribed to you. Take any new Medicines after you have completely understood and accept all the possible adverse reactions/side effects.   Please note  You were cared for by a hospitalist during your hospital stay. If you have any questions about your discharge medications or the care you received while you were in the hospital after you are discharged, you can call the unit and asked to speak with the hospitalist on call if the hospitalist that took care of you is not available. Once you are discharged, your primary care physician will handle any further medical issues. Please note that NO REFILLS for any discharge medications will be authorized once you are discharged, as it is imperative that you return to your primary care physician (or establish a relationship with a primary care physician if you do not have one) for your aftercare needs so that they can reassess your need for medications and monitor your lab values.    Today   CHIEF COMPLAINT:   Chief Complaint  Patient presents with   Weakness    HISTORY OF PRESENT ILLNESS:  Patricia Cross  is a 76 y.o. female initially came in with weakness and found to be in acute kidney injury   VITAL SIGNS:  Blood pressure (!) 153/79, pulse 72, temperature 98.4 F (36.9 C),  resp. rate 16, height '5\' 3"'  (1.6 m), weight 56.3 kg, SpO2 100 %.  I/O:   Intake/Output Summary (Last 24 hours) at 11/03/2021 1711 Last data filed at 11/03/2021 0900 Gross per 24 hour  Intake 0 ml  Output 3300 ml  Net -3300 ml    PHYSICAL EXAMINATION:  GENERAL:  76 y.o.-year-old patient lying in the bed with no acute distress.  EYES: Pupils equal, round, reactive to light and accommodation. No scleral icterus. Extraocular muscles intact.  HEENT: Head atraumatic, normocephalic. Oropharynx and nasopharynx clear.  LUNGS: Normal breath sounds bilaterally, no wheezing, rales,rhonchi or crepitation. No use of accessory muscles of respiration.  CARDIOVASCULAR: S1, S2 normal. No murmurs, rubs, or gallops.  ABDOMEN: Soft, non-tender, non-distended. Bowel sounds present. No organomegaly or mass.  EXTREMITIES: No pedal edema, cyanosis, or clubbing.  NEUROLOGIC: Cranial nerves II through XII are intact. Muscle strength 5/5 in all extremities. Sensation intact. Gait not checked.  PSYCHIATRIC: The patient is alert and oriented x 3.  SKIN: No obvious rash, lesion, or ulcer.   DATA REVIEW:   CBC Recent Labs  Lab 10/30/21  1825  WBC 6.0  HGB 10.2*  HCT 28.9*  PLT 194    Chemistries  Recent Labs  Lab 10/28/21 0915 10/28/21 1816 10/31/21 0600 11/01/21 0632 11/03/21 0855  NA 137   < > 139   < > 133*  K 5.3*   < > 3.4*   < > 3.5  CL 94*   < > 101   < > 104  CO2 <7*   < > 27   < > 23  GLUCOSE 271*   < > 187*   < > 246*  BUN 99*   < > 74*   < > 43*  CREATININE 6.93*   < > 4.84*   < > 2.57*  CALCIUM 8.8*   < > 7.8*   < > 8.5*  MG  --   --  1.8  --   --   AST 44*  --   --   --   --   ALT 24  --   --   --   --   ALKPHOS 53  --   --   --   --   BILITOT 0.8  --   --   --   --    < > = values in this interval not displayed.     Microbiology Results  Results for orders placed or performed during the hospital encounter of 10/28/21  Resp Panel by RT-PCR (Flu A&B, Covid) Nasopharyngeal Swab      Status: None   Collection Time: 10/28/21  9:17 AM   Specimen: Nasopharyngeal Swab; Nasopharyngeal(NP) swabs in vial transport medium  Result Value Ref Range Status   SARS Coronavirus 2 by RT PCR NEGATIVE NEGATIVE Final    Comment: (NOTE) SARS-CoV-2 target nucleic acids are NOT DETECTED.  The SARS-CoV-2 RNA is generally detectable in upper respiratory specimens during the acute phase of infection. The lowest concentration of SARS-CoV-2 viral copies this assay can detect is 138 copies/mL. A negative result does not preclude SARS-Cov-2 infection and should not be used as the sole basis for treatment or other patient management decisions. A negative result may occur with  improper specimen collection/handling, submission of specimen other than nasopharyngeal swab, presence of viral mutation(s) within the areas targeted by this assay, and inadequate number of viral copies(<138 copies/mL). A negative result must be combined with clinical observations, patient history, and epidemiological information. The expected result is Negative.  Fact Sheet for Patients:  EntrepreneurPulse.com.au  Fact Sheet for Healthcare Providers:  IncredibleEmployment.be  This test is no t yet approved or cleared by the [place] FDA and  has been authorized for detection and/or diagnosis of SARS-CoV-2 by FDA under an Emergency Use Authorization (EUA). This EUA will remain  in effect (meaning this test can be used) for the duration of the COVID-19 declaration under Section 564(b)(1) of the Act, 21 U.S.C.section 360bbb-3(b)(1), unless the authorization is terminated  or revoked sooner.       Influenza A by PCR NEGATIVE NEGATIVE Final   Influenza B by PCR NEGATIVE NEGATIVE Final    Comment: (NOTE) The Xpert Xpress SARS-CoV-2/FLU/RSV plus assay is intended as an aid in the diagnosis of influenza from Nasopharyngeal swab specimens and should not be used as a sole  basis for treatment. Nasal washings and aspirates are unacceptable for Xpert Xpress SARS-CoV-2/FLU/RSV testing.  Fact Sheet for Patients: EntrepreneurPulse.com.au  Fact Sheet for Healthcare Providers: IncredibleEmployment.be  This test is not yet approved or cleared by the [place] and  has been authorized for detection and/or diagnosis of SARS-CoV-2 by FDA under an Emergency Use Authorization (EUA). This EUA will remain in effect (meaning this test can be used) for the duration of the COVID-19 declaration under Section 564(b)(1) of the Act, 21 U.S.C. section 360bbb-3(b)(1), unless the authorization is terminated or revoked.  Performed at [facility], [place], [place] [address]   C Difficile Quick Screen w PCR reflex     Status: Abnormal   Collection Time: 10/30/21  8:52 AM   Specimen: STOOL  Result Value Ref Range Status   C Diff antigen POSITIVE (A) NEGATIVE Final   C Diff toxin NEGATIVE NEGATIVE Final   C Diff interpretation Results are indeterminate. See PCR results.  Final    Comment: Performed at [facility], [address] [place], [place] [address]  Gastrointestinal Panel by PCR , Stool     Status: None   Collection Time: 10/30/21  8:52 AM   Specimen: STOOL  Result Value Ref Range Status   Campylobacter species NOT DETECTED NOT DETECTED Final   Plesimonas shigelloides NOT DETECTED NOT DETECTED Final   Salmonella species NOT DETECTED NOT DETECTED Final   Yersinia enterocolitica NOT DETECTED NOT DETECTED Final   Vibrio species NOT DETECTED NOT DETECTED Final   Vibrio cholerae NOT DETECTED NOT DETECTED Final   Enteroaggregative E coli (EAEC) NOT DETECTED NOT DETECTED Final   Enteropathogenic E coli (EPEC) NOT DETECTED NOT DETECTED Final   Enterotoxigenic E coli (ETEC) NOT DETECTED NOT DETECTED Final   Shiga like toxin producing E coli (STEC) NOT DETECTED NOT DETECTED Final    Shigella/Enteroinvasive E coli (EIEC) NOT DETECTED NOT DETECTED Final   Cryptosporidium NOT DETECTED NOT DETECTED Final   Cyclospora cayetanensis NOT DETECTED NOT DETECTED Final   Entamoeba histolytica NOT DETECTED NOT DETECTED Final   Giardia lamblia NOT DETECTED NOT DETECTED Final   Adenovirus F40/41 NOT DETECTED NOT DETECTED Final   Astrovirus NOT DETECTED NOT DETECTED Final   Norovirus GI/GII NOT DETECTED NOT DETECTED Final   Rotavirus A NOT DETECTED NOT DETECTED Final   Sapovirus (I, II, IV, and V) NOT DETECTED NOT DETECTED Final    Comment: Performed at [facility], [address] [place], [place] [address]  C. Diff by PCR, Reflexed     Status: None   Collection Time: 10/30/21  8:52 AM  Result Value Ref Range Status   Toxigenic C. Difficile by PCR NEGATIVE NEGATIVE Final    Comment: Patient is colonized with non toxigenic C. difficile. May not need treatment unless significant symptoms are present. Performed at [facility], [address], [place], [place] [address]       Management plans discussed with the patient, and she is in agreement.  CODE STATUS:     Code Status Orders  (From admission, onward)           Start     Ordered   10/28/21 1207  Full code  Continuous        10/28/21 1207           Code Status History     This patient has a current code status but no historical code status.       TOTAL TIME TAKING CARE OF THIS PATIENT: 35 minutes.    Loletha Grayer M.D on 11/03/2021 at 5:11 PM   [facility]  CC: Primary care physician; Perrin Maltese, MD

## 2021-11-03 NOTE — Progress Notes (Signed)
Patient is discharging home with transportation provided by family.  Patient received AVS including education provided by primary RN including changes to medications and follow up appointments with all questions and concerns addressed at the time of teaching.  Patient's PIV was removed prior to discharge and is awaiting arrival of family to transport patient to home.

## 2021-11-03 NOTE — Progress Notes (Signed)
[facility]  ROUNDING NOTE   Subjective:   Patient sitting up in bed Completed breakfast tray at bedside Reports no complaints or concerns at this time  Creatinine 2.57 (2.98) (3.98) (4.84) Urine output 2.4L recorded in past 24 hours Foley in place  Objective:  Vital signs in last 24 hours:  Temp:  [98.3 F (36.8 C)-98.5 F (36.9 C)] 98.4 F (36.9 C) (01/18 0822) Pulse Rate:  [72-85] 72 (01/18 0822) Resp:  [16-18] 16 (01/18 0822) BP: (115-153)/(44-79) 153/79 (01/18 0822) SpO2:  [100 %] 100 % (01/18 0822) Weight:  [56.3 kg] 56.3 kg (01/18 0500)  Weight change:  Filed Weights   10/31/21 0500 11/01/21 0356 11/03/21 0500  Weight: 54.8 kg 57.4 kg 56.3 kg    Intake/Output: I/O last 3 completed shifts: In: 0  Out: 3450 [Urine:3450]   Intake/Output this shift:  No intake/output data recorded.  Physical Exam: General: NAD, resting in bed  Head: Normocephalic, atraumatic. Moist oral mucosal membranes  Eyes: Anicteric  Lungs:  Clear to auscultation, normal effort  Heart: Regular rate and rhythm  Abdomen:  Soft, nontender  Extremities:  no peripheral edema.  Neurologic: Nonfocal, moving all four extremities  Skin: No lesions  GU  Foley-clear yellow urine    Basic Metabolic Panel: Recent Labs  Lab 10/30/21 0618 10/31/21 0600 11/01/21 0632 11/02/21 0456 11/03/21 0855  NA 140 139 138 137 133*  K 3.4* 3.4* 4.2 4.1 3.5  CL 96* 101 106 107 104  CO2 33* 27 24 23 23   GLUCOSE 121* 187* 228* 144* 246*  BUN 92* 74* 60* 53* 43*  CREATININE 5.83* 4.84* 3.98* 2.98* 2.57*  CALCIUM 7.8* 7.8* 7.7* 8.0* 8.5*  MG  --  1.8  --   --   --   PHOS  --  2.9  --   --  2.9     Liver Function Tests: Recent Labs  Lab 10/28/21 0915 11/03/21 0855  AST 44*  --   ALT 24  --   ALKPHOS 53  --   BILITOT 0.8  --   PROT 7.0  --   ALBUMIN 3.7 2.8*    No results for input(s): LIPASE, AMYLASE in the last 168 hours. No results for input(s): AMMONIA in the last 168  hours.  CBC: Recent Labs  Lab 10/28/21 1816 10/29/21 0457 10/29/21 1835 10/30/21 0618 10/30/21 1825  WBC 13.3* 12.1* 8.5 5.7 6.0  HGB 9.3* 9.7* 10.0* 9.7* 10.2*  HCT 26.9* 27.5* 28.0* 27.1* 28.9*  MCV 86.8 83.8 83.1 82.6 85.3  PLT 245 239 227 196 194     Cardiac Enzymes: No results for input(s): CKTOTAL, CKMB, CKMBINDEX, TROPONINI in the last 168 hours.  BNP: Invalid input(s): POCBNP  CBG: Recent Labs  Lab 11/02/21 0826 11/02/21 1204 11/02/21 1639 11/02/21 2216 11/03/21 0746  GLUCAP 152* 332* 271* 98 185*     Microbiology: Results for orders placed or performed during the hospital encounter of 10/28/21  Resp Panel by RT-PCR (Flu A&B, Covid) Nasopharyngeal Swab     Status: None   Collection Time: 10/28/21  9:17 AM   Specimen: Nasopharyngeal Swab; Nasopharyngeal(NP) swabs in vial transport medium  Result Value Ref Range Status   SARS Coronavirus 2 by RT PCR NEGATIVE NEGATIVE Final    Comment: (NOTE) SARS-CoV-2 target nucleic acids are NOT DETECTED.  The SARS-CoV-2 RNA is generally detectable in upper respiratory specimens during the acute phase of infection. The lowest concentration of SARS-CoV-2 viral copies this assay can detect is 138  copies/mL. A negative result does not preclude SARS-Cov-2 infection and should not be used as the sole basis for treatment or other patient management decisions. A negative result may occur with  improper specimen collection/handling, submission of specimen other than nasopharyngeal swab, presence of viral mutation(s) within the areas targeted by this assay, and inadequate number of viral copies(<138 copies/mL). A negative result must be combined with clinical observations, patient history, and epidemiological information. The expected result is Negative.  Fact Sheet for Patients:  EntrepreneurPulse.com.au  Fact Sheet for Healthcare Providers:  IncredibleEmployment.be  This test is no t  yet approved or cleared by the [place] FDA and  has been authorized for detection and/or diagnosis of SARS-CoV-2 by FDA under an Emergency Use Authorization (EUA). This EUA will remain  in effect (meaning this test can be used) for the duration of the COVID-19 declaration under Section 564(b)(1) of the Act, 21 U.S.C.section 360bbb-3(b)(1), unless the authorization is terminated  or revoked sooner.       Influenza A by PCR NEGATIVE NEGATIVE Final   Influenza B by PCR NEGATIVE NEGATIVE Final    Comment: (NOTE) The Xpert Xpress SARS-CoV-2/FLU/RSV plus assay is intended as an aid in the diagnosis of influenza from Nasopharyngeal swab specimens and should not be used as a sole basis for treatment. Nasal washings and aspirates are unacceptable for Xpert Xpress SARS-CoV-2/FLU/RSV testing.  Fact Sheet for Patients: EntrepreneurPulse.com.au  Fact Sheet for Healthcare Providers: IncredibleEmployment.be  This test is not yet approved or cleared by the [place] FDA and has been authorized for detection and/or diagnosis of SARS-CoV-2 by FDA under an Emergency Use Authorization (EUA). This EUA will remain in effect (meaning this test can be used) for the duration of the COVID-19 declaration under Section 564(b)(1) of the Act, 21 U.S.C. section 360bbb-3(b)(1), unless the authorization is terminated or revoked.  Performed at [facility], [place], [place] [address]   C Difficile Quick Screen w PCR reflex     Status: Abnormal   Collection Time: 10/30/21  8:52 AM   Specimen: STOOL  Result Value Ref Range Status   C Diff antigen POSITIVE (A) NEGATIVE Final   C Diff toxin NEGATIVE NEGATIVE Final   C Diff interpretation Results are indeterminate. See PCR results.  Final    Comment: Performed at [facility], [address] [place], [place] [address]  Gastrointestinal Panel by PCR , Stool     Status: None    Collection Time: 10/30/21  8:52 AM   Specimen: STOOL  Result Value Ref Range Status   Campylobacter species NOT DETECTED NOT DETECTED Final   Plesimonas shigelloides NOT DETECTED NOT DETECTED Final   Salmonella species NOT DETECTED NOT DETECTED Final   Yersinia enterocolitica NOT DETECTED NOT DETECTED Final   Vibrio species NOT DETECTED NOT DETECTED Final   Vibrio cholerae NOT DETECTED NOT DETECTED Final   Enteroaggregative E coli (EAEC) NOT DETECTED NOT DETECTED Final   Enteropathogenic E coli (EPEC) NOT DETECTED NOT DETECTED Final   Enterotoxigenic E coli (ETEC) NOT DETECTED NOT DETECTED Final   Shiga like toxin producing E coli (STEC) NOT DETECTED NOT DETECTED Final   Shigella/Enteroinvasive E coli (EIEC) NOT DETECTED NOT DETECTED Final   Cryptosporidium NOT DETECTED NOT DETECTED Final   Cyclospora cayetanensis NOT DETECTED NOT DETECTED Final   Entamoeba histolytica NOT DETECTED NOT DETECTED Final   Giardia lamblia NOT DETECTED NOT DETECTED Final   Adenovirus F40/41 NOT DETECTED NOT DETECTED Final   Astrovirus  NOT DETECTED NOT DETECTED Final   Norovirus GI/GII NOT DETECTED NOT DETECTED Final   Rotavirus A NOT DETECTED NOT DETECTED Final   Sapovirus (I, II, IV, and V) NOT DETECTED NOT DETECTED Final    Comment: Performed at [facility], [address], [place], [place] [address]  C. Diff by PCR, Reflexed     Status: None   Collection Time: 10/30/21  8:52 AM  Result Value Ref Range Status   Toxigenic C. Difficile by PCR NEGATIVE NEGATIVE Final    Comment: Patient is colonized with non toxigenic C. difficile. May not need treatment unless significant symptoms are present. Performed at [facility], [address] [place], [place] [address]     Coagulation Studies: No results for input(s): LABPROT, INR in the last 72 hours.   Urinalysis: No results for input(s): COLORURINE, LABSPEC, PHURINE, GLUCOSEU, HGBUR, BILIRUBINUR, KETONESUR, PROTEINUR, UROBILINOGEN,  NITRITE, LEUKOCYTESUR in the last 72 hours.  Invalid input(s): APPERANCEUR     Imaging: No results found.   Medications:      Chlorhexidine Gluconate Cloth  6 each Topical Daily   insulin aspart  0-5 Units Subcutaneous QHS   insulin aspart  0-9 Units Subcutaneous TID WC   insulin aspart  4 Units Subcutaneous TID WC   insulin detemir  6 Units Subcutaneous Daily   multivitamin with minerals  1 tablet Oral Daily   pantoprazole (PROTONIX) IV  40 mg Intravenous Q12H   vancomycin  125 mg Oral QID   acetaminophen, hydrALAZINE, ondansetron (ZOFRAN) IV  Assessment/ Plan:  Patricia Cross is a 76 y.o. black female diabetes mellitus type II, GERD, anemia, hypertension, lymphocytic colitis, and hyperlipidemia, who was admitted to [facility] on 10/28/2021 for GI bleeding [K92.2] Upper GI bleed [K92.2] AKI (acute kidney injury) ([place]) [N17.9] Acute renal failure, unspecified acute renal failure type ([place]) [N17.9] Gastrointestinal hemorrhage, unspecified gastrointestinal hemorrhage type [K92.2]   Acute kidney injury: with hyperkalemia and metabolic acidosis acute. Baseline creatinine unknown. Urinalysis negative for proteinuria. Acute kidney injury secondary to prerenal azotemia with dehydration, poor PO intake, losartan and hydrochlorothiazide. Nonobstructive stone as incidental finding. No IV contrast exposure.  Now with hypokalemia - no indication for dialysis. Nonoliguric urine output.  - holding losartan and hydrochlorothiazide.  - Creatinine improved with excellent urine output - Cleared to discharge from our stance with outpatient follow up in our office.   Hypertension: Stable 153/79 Holding home blood pressure regimen of amlodipine, losartan, and hydrochlorothiazide.   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 Holding metformin. Glucose stable    LOS: 6 Narely Nobles 1/18/202310:24 AM

## 2022-01-04 ENCOUNTER — Encounter: Payer: Self-pay | Admitting: *Deleted

## 2022-01-05 ENCOUNTER — Other Ambulatory Visit: Payer: Medicare Other

## 2022-01-05 ENCOUNTER — Encounter: Payer: Medicare Other | Admitting: Internal Medicine

## 2022-01-12 ENCOUNTER — Encounter: Payer: Self-pay | Admitting: Oncology

## 2022-01-12 ENCOUNTER — Inpatient Hospital Stay: Payer: Medicare Other | Attending: Internal Medicine | Admitting: Oncology

## 2022-01-12 ENCOUNTER — Other Ambulatory Visit: Payer: Self-pay

## 2022-01-12 ENCOUNTER — Inpatient Hospital Stay: Payer: Medicare Other

## 2022-01-12 VITALS — BP 144/51 | HR 75 | Temp 96.4°F | Resp 18 | Ht 63.0 in | Wt 112.6 lb

## 2022-01-12 DIAGNOSIS — I129 Hypertensive chronic kidney disease with stage 1 through stage 4 chronic kidney disease, or unspecified chronic kidney disease: Secondary | ICD-10-CM | POA: Diagnosis not present

## 2022-01-12 DIAGNOSIS — Z79899 Other long term (current) drug therapy: Secondary | ICD-10-CM | POA: Insufficient documentation

## 2022-01-12 DIAGNOSIS — D638 Anemia in other chronic diseases classified elsewhere: Secondary | ICD-10-CM

## 2022-01-12 DIAGNOSIS — D631 Anemia in chronic kidney disease: Secondary | ICD-10-CM | POA: Diagnosis not present

## 2022-01-12 DIAGNOSIS — N1832 Chronic kidney disease, stage 3b: Secondary | ICD-10-CM | POA: Diagnosis not present

## 2022-01-12 DIAGNOSIS — D500 Iron deficiency anemia secondary to blood loss (chronic): Secondary | ICD-10-CM | POA: Insufficient documentation

## 2022-01-12 LAB — RETIC PANEL
Immature Retic Fract: 10.9 % (ref 2.3–15.9)
RBC.: 3.76 MIL/uL — ABNORMAL LOW (ref 3.87–5.11)
Retic Count, Absolute: 38.4 10*3/uL (ref 19.0–186.0)
Retic Ct Pct: 1 % (ref 0.4–3.1)
Reticulocyte Hemoglobin: 29.5 pg (ref >27.9)

## 2022-01-12 LAB — COMPREHENSIVE METABOLIC PANEL
ALT: 19 U/L (ref 0–44)
AST: 30 U/L (ref 15–41)
Albumin: 4.1 g/dL (ref 3.5–5.0)
Alkaline Phosphatase: 73 U/L (ref 38–126)
Anion gap: 10 (ref 5–15)
BUN: 35 mg/dL — ABNORMAL HIGH (ref 8–23)
CO2: 28 mmol/L (ref 22–32)
Calcium: 9.8 mg/dL (ref 8.9–10.3)
Chloride: 97 mmol/L — ABNORMAL LOW (ref 98–111)
Creatinine, Ser: 1.79 mg/dL — ABNORMAL HIGH (ref 0.44–1.00)
GFR, Estimated: 29 mL/min — ABNORMAL LOW (ref >60)
Glucose, Bld: 176 mg/dL — ABNORMAL HIGH (ref 70–99)
Potassium: 3.4 mmol/L — ABNORMAL LOW (ref 3.5–5.1)
Sodium: 135 mmol/L (ref 135–145)
Total Bilirubin: 0.4 mg/dL (ref 0.3–1.2)
Total Protein: 7.4 g/dL (ref 6.5–8.1)

## 2022-01-12 LAB — CBC WITH DIFFERENTIAL/PLATELET
Abs Immature Granulocytes: 0.02 10*3/uL (ref 0.00–0.07)
Basophils Absolute: 0 10*3/uL (ref 0.0–0.1)
Basophils Relative: 1 %
Eosinophils Absolute: 0.1 10*3/uL (ref 0.0–0.5)
Eosinophils Relative: 2 %
HCT: 33.5 % — ABNORMAL LOW (ref 36.0–46.0)
Hemoglobin: 10.9 g/dL — ABNORMAL LOW (ref 12.0–15.0)
Immature Granulocytes: 0 %
Lymphocytes Relative: 24 %
Lymphs Abs: 1.5 10*3/uL (ref 0.7–4.0)
MCH: 28.8 pg (ref 26.0–34.0)
MCHC: 32.5 g/dL (ref 30.0–36.0)
MCV: 88.6 fL (ref 80.0–100.0)
Monocytes Absolute: 0.6 10*3/uL (ref 0.1–1.0)
Monocytes Relative: 10 %
Neutro Abs: 4.1 10*3/uL (ref 1.7–7.7)
Neutrophils Relative %: 63 %
Platelets: 257 10*3/uL (ref 150–400)
RBC: 3.78 MIL/uL — ABNORMAL LOW (ref 3.87–5.11)
RDW: 14.2 % (ref 11.5–15.5)
WBC: 6.4 10*3/uL (ref 4.0–10.5)
nRBC: 0 % (ref 0.0–0.2)

## 2022-01-12 LAB — TECHNOLOGIST SMEAR REVIEW: Plt Morphology: ADEQUATE

## 2022-01-12 LAB — IRON AND TIBC
Iron: 33 ug/dL (ref 28–170)
Saturation Ratios: 7 % — ABNORMAL LOW (ref 10.4–31.8)
TIBC: 462 ug/dL — ABNORMAL HIGH (ref 250–450)
UIBC: 429 ug/dL

## 2022-01-12 LAB — LACTATE DEHYDROGENASE: LDH: 158 U/L (ref 98–192)

## 2022-01-12 LAB — FERRITIN: Ferritin: 7 ng/mL — ABNORMAL LOW (ref 11–307)

## 2022-01-12 LAB — VITAMIN B12: Vitamin B-12: 510 pg/mL (ref 180–914)

## 2022-01-12 LAB — FOLATE: Folate: 62 ng/mL (ref >5.9)

## 2022-01-12 MED ORDER — FERROUS SULFATE 325 (65 FE) MG PO TBEC: 325.0000 mg | DELAYED_RELEASE_TABLET | Freq: Two times a day (BID) | ORAL | 2 refills | Status: DC

## 2022-01-12 NOTE — Progress Notes (Signed)
?Hematology/Oncology Consult note ?[phone]) [phone] [phone] ?  ? ?   ? ? ?Patient Care Team: ?Perrin Maltese, MD as PCP - General (Internal Medicine) ?Earlie Server, MD as Consulting Physician (Hematology) ? ?REFERRING PROVIDER: ?Perrin Maltese, MD  ?CHIEF COMPLAINTS/REASON FOR VISIT:  ?Evaluation of anemia ? ?HISTORY OF PRESENTING ILLNESS:  ? ?Patricia Cross is a  76 y.o.  female with PMH listed below was seen in consultation at the request of  Perrin Maltese, MD  for evaluation of anemia ? ?10/28/21-11/03/21 ?Patient was hospitalized for melena, diarrhea, nausea and vomtiing and acute kidney failure. She presented with creatinine of 6.93, hyperkalemia. Renal ultrasound showed mild bilateral increased renal cortical echotexture consistent with medical renal disease. Acute kidney injury was felt to be secondary to prerenal azotemia with dehydration, poor PO intake, losartan and hydrochlorothiazide. Creatinine improved after hydration.  ?11/02/21, prior to discharge, creatinine was 2.57  ?2/278/203 Creatinine 1.44.  ?She has anemia, 10/30/2021, Hb 9.7, hct 82.6 ?and was refer to hematology for work up.  ? ?She reports feeling really well today. She denies ever having any issue of her kidney prior to her recent admission. Reports she has been chronic anemia. ?she has lost some weight.  ?Appetite is fair.  ?Denies  fever, chills, fatigue, night sweats.  ?She has long standing diabetes.  ?She is a school bus driver.  ? ? ?Review of Systems  ?Constitutional:  Negative for appetite change, chills, fatigue and fever.  ?HENT:   Negative for hearing loss and voice change.   ?Eyes:  Negative for eye problems.  ?Respiratory:  Negative for chest tightness and cough.   ?Cardiovascular:  Negative for chest pain.  ?Gastrointestinal:  Negative for abdominal distention, abdominal pain and blood in stool.  ?Endocrine: Negative for hot flashes.  ?Genitourinary:  Negative for difficulty urinating and frequency.    ?Musculoskeletal:  Negative for arthralgias.  ?Skin:  Negative for itching and rash.  ?Neurological:  Negative for extremity weakness.  ?Hematological:  Negative for adenopathy.  ?Psychiatric/Behavioral:  Negative for confusion.   ? ?MEDICAL HISTORY:  ?Past Medical History:  ?Diagnosis Date  ? Acute renal failure (ARF) (HCC)   ? Anemia   ? Diabetes mellitus without complication ([place]) 14/78/2956  ? High cholesterol   ? Hypertension   ? ? ?SURGICAL HISTORY: ?Past Surgical History:  ?Procedure Laterality Date  ? ABDOMINAL HYSTERECTOMY  1981  ? BREAST BIOPSY Left 2005  ? neg core  ? BREAST BIOPSY Right 2015  ? neg core-Fibroadenoma  ? COLONOSCOPY WITH PROPOFOL N/A 01/07/2016  ? Procedure: COLONOSCOPY WITH PROPOFOL;  Surgeon: Hulen Luster, MD;  Location: [facility] ENDOSCOPY;  Service: Gastroenterology;  Laterality: N/A;  ? COLONOSCOPY WITH PROPOFOL N/A 05/09/2018  ? Procedure: COLONOSCOPY WITH PROPOFOL;  Surgeon: Toledo, Benay Pike, MD;  Location: [facility] ENDOSCOPY;  Service: Gastroenterology;  Laterality: N/A;  ? ESOPHAGOGASTRODUODENOSCOPY (EGD) WITH PROPOFOL N/A 05/09/2018  ? Procedure: ESOPHAGOGASTRODUODENOSCOPY (EGD) WITH PROPOFOL;  Surgeon: Toledo, Benay Pike, MD;  Location: [facility] ENDOSCOPY;  Service: Gastroenterology;  Laterality: N/A;  ? MICROLARYNGOSCOPY WITH CO2 LASER AND EXCISION OF VOCAL CORD LESION  1979  ? OOPHORECTOMY    ? one left, don't know which one  ? TUBAL LIGATION  1975  ? ? ?SOCIAL HISTORY: ?Social History  ? ?Socioeconomic History  ? Marital status: Widowed  ?  Spouse name: Not on file  ? Number of children: Not on file  ? Years of education: Not on file  ? Highest education level: Not on  file  ?Occupational History  ? Not on file  ?Tobacco Use  ? Smoking status: Never  ? Smokeless tobacco: Never  ?Vaping Use  ? Vaping Use: Never used  ?Substance and Sexual Activity  ? Alcohol use: No  ? Drug use: No  ? Sexual activity: Not on file  ?Other Topics Concern  ? Not on file  ?Social History Narrative  ? Not on file   ? ?Social Determinants of Health  ? ?Financial Resource Strain: Not on file  ?Food Insecurity: Not on file  ?Transportation Needs: Not on file  ?Physical Activity: Not on file  ?Stress: Not on file  ?Social Connections: Not on file  ?Intimate Partner Violence: Not on file  ? ? ?FAMILY HISTORY: ?Family History  ?Problem Relation Age of Onset  ? Hypertension Mother   ? Stroke Mother   ? Gout Father   ? Colon cancer Sister   ? Pancreatic cancer Daughter   ? Breast cancer Neg Hx   ? ? ?ALLERGIES:  has No Known Allergies. ? ?MEDICATIONS:  ?Current Outpatient Medications  ?Medication Sig Dispense Refill  ? amLODipine (NORVASC) 5 MG tablet Take 1 tablet (5 mg total) by mouth daily. 30 tablet 0  ? ASPIRIN 81 PO Aspirin 81 MG Oral Tablet QTY: 0 tablet Days: 0 Refills: 0  Written: 09/07/18 Patient Instructions:    ? glipiZIDE (GLUCOTROL XL) 5 MG 24 hr tablet Take 1 tablet (5 mg total) by mouth daily with breakfast. 30 tablet 0  ? hydrochlorothiazide (HYDRODIURIL) 25 MG tablet hydroCHLOROthiazide 25 MG Oral Tablet QTY: 90 tablet Days: 90 Refills: 1  Written: 09/01/21 Patient Instructions: Take 1 tablet by mouth once daily    ? losartan (COZAAR) 100 MG tablet Losartan Potassium 100 MG Oral Tablet QTY: 90 tablet Days: 90 Refills: 3  Written: 07/16/21 Patient Instructions: Take one tablet by mouth once daily    ? Multiple Vitamin (MULTIVITAMIN) tablet Take 1 tablet by mouth daily.    ? Multiple Vitamins-Minerals (CENTRUM SILVER) tablet Centrum Silver Oral Tablet QTY: 0 tablet Days: 0 Refills: 0  Written: 11/12/15 Patient Instructions:    ? pantoprazole (PROTONIX) 40 MG tablet Take 1 tablet (40 mg total) by mouth daily. 30 tablet 0  ? rosuvastatin (CRESTOR) 40 MG tablet Take 40 mg by mouth daily.    ? sitaGLIPtin (JANUVIA) 50 MG tablet Take 1 tablet (50 mg total) by mouth daily. 30 tablet 0  ? ?No current facility-administered medications for this visit.  ? ? ? ?PHYSICAL EXAMINATION: ? ?Vitals:  ? 01/12/22 1047  ?BP: (!) 144/51   ?Pulse: 75  ?Resp: 18  ?Temp: (!) 96.4 ?F (35.8 ?C)  ? ?Filed Weights  ? 01/12/22 1047  ?Weight: 112 lb 9.6 oz (51.1 kg)  ? ? ?Physical Exam ?Constitutional:   ?   General: She is not in acute distress. ?HENT:  ?   Head: Normocephalic and atraumatic.  ?Eyes:  ?   General: No scleral icterus. ?Cardiovascular:  ?   Rate and Rhythm: Normal rate and regular rhythm.  ?   Heart sounds: Normal heart sounds.  ?Pulmonary:  ?   Effort: Pulmonary effort is normal. No respiratory distress.  ?   Breath sounds: No wheezing.  ?Abdominal:  ?   General: Bowel sounds are normal. There is no distension.  ?   Palpations: Abdomen is soft.  ?Musculoskeletal:     ?   General: No deformity. Normal range of motion.  ?   Cervical back: Normal range of motion  and neck supple.  ?Skin: ?   General: Skin is warm and dry.  ?   Findings: No erythema or rash.  ?Neurological:  ?   Mental Status: She is alert and oriented to person, place, and time. Mental status is at baseline.  ?   Cranial Nerves: No cranial nerve deficit.  ?   Coordination: Coordination normal.  ?Psychiatric:     ?   Mood and Affect: Mood normal.  ? ? ?LABORATORY DATA:  ?I have reviewed the data as listed ?Lab Results  ?Component Value Date  ? WBC 6.4 01/12/2022  ? HGB 10.9 (L) 01/12/2022  ? HCT 33.5 (L) 01/12/2022  ? MCV 88.6 01/12/2022  ? PLT 257 01/12/2022  ? ?Recent Labs  ?  10/28/21 ?1610 10/28/21 ?1816 11/02/21 ?9604 11/03/21 ?5409 01/12/22 ?1123  ?NA 137   < > 137 133* 135  ?K 5.3*   < > 4.1 3.5 3.4*  ?CL 94*   < > 107 104 97*  ?CO2 <7*   < > '23 23 28  '$ ?GLUCOSE 271*   < > 144* 246* 176*  ?BUN 99*   < > 53* 43* 35*  ?CREATININE 6.93*   < > 2.98* 2.57* 1.79*  ?CALCIUM 8.8*   < > 8.0* 8.5* 9.8  ?GFRNONAA 6*   < > 16* 19* 29*  ?PROT 7.0  --   --   --  7.4  ?ALBUMIN 3.7  --   --  2.8* 4.1  ?AST 44*  --   --   --  30  ?ALT 24  --   --   --  19  ?ALKPHOS 53  --   --   --  73  ?BILITOT 0.8  --   --   --  0.4  ? < > = values in this interval not displayed.  ? ?Iron/TIBC/Ferritin/  %Sat ?   ?Component Value Date/Time  ? IRON 33 01/12/2022 1123  ? TIBC 462 (H) 01/12/2022 1123  ? FERRITIN 7 (L) 01/12/2022 1123  ? FERRITIN 7 (L) 01/27/2014 1459  ? IRONPCTSAT 7 (L) 01/12/2022 1123  ?  ? ? ?RADIO

## 2022-01-12 NOTE — Progress Notes (Signed)
Pt here to establish care.

## 2022-01-13 ENCOUNTER — Telehealth: Payer: Self-pay

## 2022-01-13 LAB — KAPPA/LAMBDA LIGHT CHAINS
Kappa free light chain: 76.8 mg/L — ABNORMAL HIGH (ref 3.3–19.4)
Kappa, lambda light chain ratio: 2.41 — ABNORMAL HIGH (ref 0.26–1.65)
Lambda free light chains: 31.9 mg/L — ABNORMAL HIGH (ref 5.7–26.3)

## 2022-01-13 NOTE — Telephone Encounter (Signed)
-----   Message from Earlie Server, MD sent at 01/12/2022  8:29 PM EDT ----- ?Please let her know that blood work showed that her iron level is low. I recommend her to start ferrous sulfate '325mg'$  BID.Rx was sent to pharmacy. Some tests are still pending and I will review results during her next visit.  ?I recommend her to get a follow up appt with Dr.Toledo/ Laurine Blazer. she was seen by [facility] during her hospitalization and was supposed to follow up.   ?

## 2022-01-13 NOTE — Telephone Encounter (Signed)
Pt informed of results and recommendations. She verbalized understanding and will call GI to set up appt.  ?

## 2022-01-14 LAB — HGB FRACTIONATION CASCADE
Hgb A2: 2.2 % (ref 1.8–3.2)
Hgb A: 97.8 % (ref 96.4–98.8)
Hgb F: 0 % (ref 0.0–2.0)
Hgb S: 0 %

## 2022-01-17 LAB — MULTIPLE MYELOMA PANEL, SERUM
Albumin SerPl Elph-Mcnc: 4.1 g/dL (ref 2.9–4.4)
Albumin/Glob SerPl: 1.4 (ref 0.7–1.7)
Alpha 1: 0.2 g/dL (ref 0.0–0.4)
Alpha2 Glob SerPl Elph-Mcnc: 0.7 g/dL (ref 0.4–1.0)
B-Globulin SerPl Elph-Mcnc: 0.9 g/dL (ref 0.7–1.3)
Gamma Glob SerPl Elph-Mcnc: 1.1 g/dL (ref 0.4–1.8)
Globulin, Total: 3 g/dL (ref 2.2–3.9)
IgA: 100 mg/dL (ref 64–422)
IgG (Immunoglobin G), Serum: 1073 mg/dL (ref 586–1602)
IgM (Immunoglobulin M), Srm: 88 mg/dL (ref 26–217)
Total Protein ELP: 7.1 g/dL (ref 6.0–8.5)

## 2022-02-08 ENCOUNTER — Inpatient Hospital Stay: Payer: Medicare Other | Attending: Oncology | Admitting: Oncology

## 2022-02-08 ENCOUNTER — Inpatient Hospital Stay: Payer: Medicare Other

## 2022-02-08 ENCOUNTER — Encounter: Payer: Self-pay | Admitting: Oncology

## 2022-02-08 VITALS — BP 154/69 | HR 85 | Temp 96.7°F | Ht 63.0 in | Wt 108.0 lb

## 2022-02-08 DIAGNOSIS — I129 Hypertensive chronic kidney disease with stage 1 through stage 4 chronic kidney disease, or unspecified chronic kidney disease: Secondary | ICD-10-CM | POA: Insufficient documentation

## 2022-02-08 DIAGNOSIS — E1122 Type 2 diabetes mellitus with diabetic chronic kidney disease: Secondary | ICD-10-CM | POA: Diagnosis not present

## 2022-02-08 DIAGNOSIS — R634 Abnormal weight loss: Secondary | ICD-10-CM | POA: Insufficient documentation

## 2022-02-08 DIAGNOSIS — N1832 Chronic kidney disease, stage 3b: Secondary | ICD-10-CM | POA: Diagnosis not present

## 2022-02-08 DIAGNOSIS — D500 Iron deficiency anemia secondary to blood loss (chronic): Secondary | ICD-10-CM | POA: Insufficient documentation

## 2022-02-08 DIAGNOSIS — Z79899 Other long term (current) drug therapy: Secondary | ICD-10-CM | POA: Diagnosis not present

## 2022-02-08 DIAGNOSIS — Z7984 Long term (current) use of oral hypoglycemic drugs: Secondary | ICD-10-CM | POA: Insufficient documentation

## 2022-02-08 DIAGNOSIS — R768 Other specified abnormal immunological findings in serum: Secondary | ICD-10-CM

## 2022-02-08 DIAGNOSIS — D631 Anemia in chronic kidney disease: Secondary | ICD-10-CM

## 2022-02-08 LAB — CBC
HCT: 33.4 % — ABNORMAL LOW (ref 36.0–46.0)
Hemoglobin: 10.8 g/dL — ABNORMAL LOW (ref 12.0–15.0)
MCH: 28.6 pg (ref 26.0–34.0)
MCHC: 32.3 g/dL (ref 30.0–36.0)
MCV: 88.6 fL (ref 80.0–100.0)
Platelets: 270 10*3/uL (ref 150–400)
RBC: 3.77 MIL/uL — ABNORMAL LOW (ref 3.87–5.11)
RDW: 16.1 % — ABNORMAL HIGH (ref 11.5–15.5)
WBC: 6.4 10*3/uL (ref 4.0–10.5)
nRBC: 0 % (ref 0.0–0.2)

## 2022-02-08 LAB — FERRITIN: Ferritin: 11 ng/mL (ref 11–307)

## 2022-02-08 LAB — TSH: TSH: 0.668 u[IU]/mL (ref 0.350–4.500)

## 2022-02-08 LAB — IRON AND TIBC
Iron: 42 ug/dL (ref 28–170)
Saturation Ratios: 10 % — ABNORMAL LOW (ref 10.4–31.8)
TIBC: 438 ug/dL (ref 250–450)
UIBC: 396 ug/dL

## 2022-02-08 NOTE — Progress Notes (Signed)
?Hematology/Oncology Progress note ?Telephone:(336) [phone] Fax:(336) [phone] ?  ? ?  ? ?   ? ? ?Patient Care Team: ?Perrin Maltese, MD as PCP - General (Internal Medicine) ?Earlie Server, MD as Consulting Physician (Hematology) ? ?REFERRING PROVIDER: ?Perrin Maltese, MD  ?CHIEF COMPLAINTS/REASON FOR VISIT:  ?Follow-up for anemia, unintentional weight loss ? ?HISTORY OF PRESENTING ILLNESS:  ? ?Patricia Cross is a  76 y.o.  female presents for follow-up. ? ?10/28/21-11/03/21 ?Patient was hospitalized for melena, diarrhea, nausea and vomtiing and acute kidney failure. She presented with creatinine of 6.93, hyperkalemia. Renal ultrasound showed mild bilateral increased renal cortical echotexture consistent with medical renal disease. Acute kidney injury was felt to be secondary to prerenal azotemia with dehydration, poor PO intake, losartan and hydrochlorothiazide. Creatinine improved after hydration.  ?11/02/21, prior to discharge, creatinine was 2.57  ?2/278/203 Creatinine 1.44.  ?She has anemia, 10/30/2021, Hb 9.7, hct 82.6 ?and was refer to hematology for work up.  ? ?She reports feeling really well today. She denies ever having any issue of her kidney prior to her recent admission. Reports she has been chronic anemia. ?she has lost some weight.  ?Appetite is fair.  ?Denies  fever, chills, fatigue, night sweats.  ?She has long standing diabetes.  ?She is a school bus driver.  ? ?INTERVAL HISTORY ?Patricia Cross is a 76 y.o. female who has above history reviewed by me today presents for follow up visit for  anemia ?Patient reports having good appetite, eats normal portion for every meal. ?Denies any nausea vomiting diarrhea, abdominal pain. ?She has lost 4 pounds since last visit about 4 weeks ago.  She has had blood work done and present to discuss results. ?Per my advice, she has started ferrous sulfate 325 mg twice daily for 3 to 4 weeks.  She feels clinically improving. ? ? ? ?Review of Systems   ?Constitutional:  Positive for unexpected weight change. Negative for appetite change, chills, fatigue and fever.  ?HENT:   Negative for hearing loss and voice change.   ?Eyes:  Negative for eye problems.  ?Respiratory:  Negative for chest tightness and cough.   ?Cardiovascular:  Negative for chest pain.  ?Gastrointestinal:  Negative for abdominal distention, abdominal pain and blood in stool.  ?Endocrine: Negative for hot flashes.  ?Genitourinary:  Negative for difficulty urinating and frequency.   ?Musculoskeletal:  Negative for arthralgias.  ?Skin:  Negative for itching and rash.  ?Neurological:  Negative for extremity weakness.  ?Hematological:  Negative for adenopathy.  ?Psychiatric/Behavioral:  Negative for confusion.   ? ?MEDICAL HISTORY:  ?Past Medical History:  ?Diagnosis Date  ? Acute renal failure (ARF) (HCC)   ? Anemia   ? Diabetes mellitus without complication ([place]) 17/40/8144  ? High cholesterol   ? Hypertension   ? ? ?SURGICAL HISTORY: ?Past Surgical History:  ?Procedure Laterality Date  ? ABDOMINAL HYSTERECTOMY  1981  ? BREAST BIOPSY Left 2005  ? neg core  ? BREAST BIOPSY Right 2015  ? neg core-Fibroadenoma  ? COLONOSCOPY WITH PROPOFOL N/A 01/07/2016  ? Procedure: COLONOSCOPY WITH PROPOFOL;  Surgeon: Hulen Luster, MD;  Location: [facility] ENDOSCOPY;  Service: Gastroenterology;  Laterality: N/A;  ? COLONOSCOPY WITH PROPOFOL N/A 05/09/2018  ? Procedure: COLONOSCOPY WITH PROPOFOL;  Surgeon: Toledo, Benay Pike, MD;  Location: [facility] ENDOSCOPY;  Service: Gastroenterology;  Laterality: N/A;  ? ESOPHAGOGASTRODUODENOSCOPY (EGD) WITH PROPOFOL N/A 05/09/2018  ? Procedure: ESOPHAGOGASTRODUODENOSCOPY (EGD) WITH PROPOFOL;  Surgeon: Toledo, Benay Pike, MD;  Location: [facility] ENDOSCOPY;  Service:  Gastroenterology;  Laterality: N/A;  ? MICROLARYNGOSCOPY WITH CO2 LASER AND EXCISION OF VOCAL CORD LESION  1979  ? OOPHORECTOMY    ? one left, don't know which one  ? TUBAL LIGATION  1975  ? ? ?SOCIAL HISTORY: ?Social History   ? ?Socioeconomic History  ? Marital status: Widowed  ?  Spouse name: Not on file  ? Number of children: Not on file  ? Years of education: Not on file  ? Highest education level: Not on file  ?Occupational History  ? Not on file  ?Tobacco Use  ? Smoking status: Never  ? Smokeless tobacco: Never  ?Vaping Use  ? Vaping Use: Never used  ?Substance and Sexual Activity  ? Alcohol use: No  ? Drug use: No  ? Sexual activity: Not on file  ?Other Topics Concern  ? Not on file  ?Social History Narrative  ? Not on file  ? ?Social Determinants of Health  ? ?Financial Resource Strain: Not on file  ?Food Insecurity: Not on file  ?Transportation Needs: Not on file  ?Physical Activity: Not on file  ?Stress: Not on file  ?Social Connections: Not on file  ?Intimate Partner Violence: Not on file  ? ? ?FAMILY HISTORY: ?Family History  ?Problem Relation Age of Onset  ? Hypertension Mother   ? Stroke Mother   ? Gout Father   ? Colon cancer Sister   ? Pancreatic cancer Daughter   ? Breast cancer Neg Hx   ? ? ?ALLERGIES:  has No Known Allergies. ? ?MEDICATIONS:  ?Current Outpatient Medications  ?Medication Sig Dispense Refill  ? amLODipine (NORVASC) 5 MG tablet Take 1 tablet (5 mg total) by mouth daily. 30 tablet 0  ? ASPIRIN 81 PO Aspirin 81 MG Oral Tablet QTY: 0 tablet Days: 0 Refills: 0  Written: 09/07/18 Patient Instructions:    ? ferrous sulfate 325 (65 FE) MG EC tablet Take 1 tablet (325 mg total) by mouth 2 (two) times daily with a meal. 60 tablet 2  ? glipiZIDE (GLUCOTROL XL) 5 MG 24 hr tablet Take 1 tablet (5 mg total) by mouth daily with breakfast. 30 tablet 0  ? hydrochlorothiazide (HYDRODIURIL) 25 MG tablet hydroCHLOROthiazide 25 MG Oral Tablet QTY: 90 tablet Days: 90 Refills: 1  Written: 09/01/21 Patient Instructions: Take 1 tablet by mouth once daily    ? losartan (COZAAR) 100 MG tablet Losartan Potassium 100 MG Oral Tablet QTY: 90 tablet Days: 90 Refills: 3  Written: 07/16/21 Patient Instructions: Take one tablet by mouth  once daily    ? Multiple Vitamin (MULTIVITAMIN) tablet Take 1 tablet by mouth daily.    ? Multiple Vitamins-Minerals (CENTRUM SILVER) tablet Centrum Silver Oral Tablet QTY: 0 tablet Days: 0 Refills: 0  Written: 11/12/15 Patient Instructions:    ? pantoprazole (PROTONIX) 40 MG tablet Take 1 tablet (40 mg total) by mouth daily. 30 tablet 0  ? rosuvastatin (CRESTOR) 40 MG tablet Take 40 mg by mouth daily.    ? sitaGLIPtin (JANUVIA) 50 MG tablet Take 1 tablet (50 mg total) by mouth daily. 30 tablet 0  ? ?No current facility-administered medications for this visit.  ? ? ? ?PHYSICAL EXAMINATION: ? ?Vitals:  ? 02/08/22 0852  ?BP: (!) 154/69  ?Pulse: 85  ?Temp: (!) 96.7 ?F (35.9 ?C)  ? ?Filed Weights  ? 02/08/22 0852  ?Weight: 108 lb (49 kg)  ? ? ?Physical Exam ?Constitutional:   ?   General: She is not in acute distress. ?HENT:  ?  Head: Normocephalic and atraumatic.  ?Eyes:  ?   General: No scleral icterus. ?Cardiovascular:  ?   Rate and Rhythm: Normal rate and regular rhythm.  ?   Heart sounds: Normal heart sounds.  ?Pulmonary:  ?   Effort: Pulmonary effort is normal. No respiratory distress.  ?   Breath sounds: No wheezing.  ?Abdominal:  ?   General: Bowel sounds are normal. There is no distension.  ?   Palpations: Abdomen is soft.  ?Musculoskeletal:     ?   General: No deformity. Normal range of motion.  ?   Cervical back: Normal range of motion and neck supple.  ?Skin: ?   General: Skin is warm and dry.  ?   Findings: No erythema or rash.  ?Neurological:  ?   Mental Status: She is alert and oriented to person, place, and time. Mental status is at baseline.  ?   Cranial Nerves: No cranial nerve deficit.  ?   Coordination: Coordination normal.  ?Psychiatric:     ?   Mood and Affect: Mood normal.  ? ? ?LABORATORY DATA:  ?I have reviewed the data as listed ?Lab Results  ?Component Value Date  ? WBC 6.4 02/08/2022  ? HGB 10.8 (L) 02/08/2022  ? HCT 33.4 (L) 02/08/2022  ? MCV 88.6 02/08/2022  ? PLT 270 02/08/2022  ? ?Recent  Labs  ?  10/28/21 ?9628 10/28/21 ?1816 11/02/21 ?3662 11/03/21 ?9476 01/12/22 ?1123  ?NA 137   < > 137 133* 135  ?K 5.3*   < > 4.1 3.5 3.4*  ?CL 94*   < > 107 104 97*  ?CO2 <7*   < > '23 23 28  '$ ?GLUCOSE 271*   < > 144* 246* 176*

## 2022-02-11 ENCOUNTER — Telehealth: Payer: Self-pay

## 2022-02-11 NOTE — Telephone Encounter (Signed)
Per Tonita Phoenix: CT chest pending denial, weight loss is not >5%. P2P is required. Per Dr. Tasia Catchings cancel CT scan on 5/3, pt seen nutritionist.  ? ?Unable to reach patient by phone, detailed VM left regarding scan cancellation.  ?

## 2022-02-16 ENCOUNTER — Ambulatory Visit: Payer: Medicare Other

## 2022-02-23 ENCOUNTER — Inpatient Hospital Stay: Payer: Medicare Other

## 2022-04-22 ENCOUNTER — Other Ambulatory Visit: Payer: Self-pay | Admitting: Oncology

## 2022-05-09 ENCOUNTER — Encounter: Admission: RE | Payer: Self-pay | Source: Ambulatory Visit

## 2022-05-09 ENCOUNTER — Ambulatory Visit: Admission: RE | Admit: 2022-05-09 | Payer: Medicare Other | Source: Ambulatory Visit | Admitting: Gastroenterology

## 2022-05-09 SURGERY — ESOPHAGOGASTRODUODENOSCOPY (EGD) WITH PROPOFOL
Anesthesia: General

## 2022-05-10 ENCOUNTER — Inpatient Hospital Stay: Payer: Medicare Other

## 2022-05-12 ENCOUNTER — Inpatient Hospital Stay: Payer: Medicare Other | Admitting: Oncology

## 2022-05-14 ENCOUNTER — Other Ambulatory Visit: Payer: Self-pay | Admitting: Oncology

## 2022-05-14 MED ORDER — FERROUS SULFATE 325 (65 FE) MG PO TBEC: 325.0000 mg | DELAYED_RELEASE_TABLET | Freq: Two times a day (BID) | ORAL | 0 refills | Status: DC

## 2022-08-04 ENCOUNTER — Inpatient Hospital Stay
Admission: EM | Admit: 2022-08-04 | Discharge: 2022-08-10 | DRG: 683 | Disposition: A | Discharge status: Home / Self Care | Payer: Medicare Other | Attending: Hospitalist | Admitting: Hospitalist

## 2022-08-04 ENCOUNTER — Encounter: Payer: Self-pay | Admitting: Internal Medicine

## 2022-08-04 ENCOUNTER — Other Ambulatory Visit: Payer: Self-pay

## 2022-08-04 ENCOUNTER — Emergency Department: Payer: Medicare Other

## 2022-08-04 DIAGNOSIS — N200 Calculus of kidney: Secondary | ICD-10-CM | POA: Diagnosis present

## 2022-08-04 DIAGNOSIS — K922 Gastrointestinal hemorrhage, unspecified: Secondary | ICD-10-CM | POA: Diagnosis present

## 2022-08-04 DIAGNOSIS — E7800 Pure hypercholesterolemia, unspecified: Secondary | ICD-10-CM | POA: Diagnosis present

## 2022-08-04 DIAGNOSIS — J069 Acute upper respiratory infection, unspecified: Secondary | ICD-10-CM | POA: Diagnosis present

## 2022-08-04 DIAGNOSIS — Z79899 Other long term (current) drug therapy: Secondary | ICD-10-CM

## 2022-08-04 DIAGNOSIS — E1165 Type 2 diabetes mellitus with hyperglycemia: Secondary | ICD-10-CM | POA: Diagnosis present

## 2022-08-04 DIAGNOSIS — E876 Hypokalemia: Secondary | ICD-10-CM | POA: Diagnosis not present

## 2022-08-04 DIAGNOSIS — K222 Esophageal obstruction: Secondary | ICD-10-CM | POA: Diagnosis present

## 2022-08-04 DIAGNOSIS — Z8249 Family history of ischemic heart disease and other diseases of the circulatory system: Secondary | ICD-10-CM

## 2022-08-04 DIAGNOSIS — Z800 Family history of malignant neoplasm of digestive organs: Secondary | ICD-10-CM

## 2022-08-04 DIAGNOSIS — Z7984 Long term (current) use of oral hypoglycemic drugs: Secondary | ICD-10-CM

## 2022-08-04 DIAGNOSIS — K317 Polyp of stomach and duodenum: Secondary | ICD-10-CM | POA: Diagnosis present

## 2022-08-04 DIAGNOSIS — K449 Diaphragmatic hernia without obstruction or gangrene: Secondary | ICD-10-CM | POA: Diagnosis present

## 2022-08-04 DIAGNOSIS — D649 Anemia, unspecified: Secondary | ICD-10-CM | POA: Diagnosis present

## 2022-08-04 DIAGNOSIS — E1122 Type 2 diabetes mellitus with diabetic chronic kidney disease: Secondary | ICD-10-CM | POA: Diagnosis present

## 2022-08-04 DIAGNOSIS — E11649 Type 2 diabetes mellitus with hypoglycemia without coma: Secondary | ICD-10-CM | POA: Diagnosis not present

## 2022-08-04 DIAGNOSIS — K921 Melena: Secondary | ICD-10-CM | POA: Diagnosis present

## 2022-08-04 DIAGNOSIS — Z794 Long term (current) use of insulin: Secondary | ICD-10-CM | POA: Diagnosis not present

## 2022-08-04 DIAGNOSIS — R197 Diarrhea, unspecified: Secondary | ICD-10-CM | POA: Diagnosis present

## 2022-08-04 DIAGNOSIS — I129 Hypertensive chronic kidney disease with stage 1 through stage 4 chronic kidney disease, or unspecified chronic kidney disease: Secondary | ICD-10-CM | POA: Diagnosis present

## 2022-08-04 DIAGNOSIS — N2581 Secondary hyperparathyroidism of renal origin: Secondary | ICD-10-CM | POA: Diagnosis present

## 2022-08-04 DIAGNOSIS — N179 Acute kidney failure, unspecified: Secondary | ICD-10-CM | POA: Diagnosis present

## 2022-08-04 DIAGNOSIS — R1310 Dysphagia, unspecified: Secondary | ICD-10-CM | POA: Diagnosis present

## 2022-08-04 DIAGNOSIS — D631 Anemia in chronic kidney disease: Secondary | ICD-10-CM | POA: Diagnosis present

## 2022-08-04 DIAGNOSIS — I10 Essential (primary) hypertension: Secondary | ICD-10-CM | POA: Diagnosis present

## 2022-08-04 DIAGNOSIS — Z823 Family history of stroke: Secondary | ICD-10-CM

## 2022-08-04 DIAGNOSIS — E860 Dehydration: Secondary | ICD-10-CM | POA: Diagnosis present

## 2022-08-04 DIAGNOSIS — D62 Acute posthemorrhagic anemia: Secondary | ICD-10-CM | POA: Diagnosis present

## 2022-08-04 DIAGNOSIS — R195 Other fecal abnormalities: Secondary | ICD-10-CM

## 2022-08-04 DIAGNOSIS — K8689 Other specified diseases of pancreas: Secondary | ICD-10-CM | POA: Diagnosis present

## 2022-08-04 DIAGNOSIS — K869 Disease of pancreas, unspecified: Secondary | ICD-10-CM

## 2022-08-04 DIAGNOSIS — D638 Anemia in other chronic diseases classified elsewhere: Secondary | ICD-10-CM | POA: Diagnosis present

## 2022-08-04 DIAGNOSIS — N1831 Chronic kidney disease, stage 3a: Secondary | ICD-10-CM | POA: Diagnosis present

## 2022-08-04 DIAGNOSIS — Z7982 Long term (current) use of aspirin: Secondary | ICD-10-CM

## 2022-08-04 DIAGNOSIS — K5900 Constipation, unspecified: Secondary | ICD-10-CM | POA: Diagnosis present

## 2022-08-04 DIAGNOSIS — Z90710 Acquired absence of both cervix and uterus: Secondary | ICD-10-CM

## 2022-08-04 LAB — CBC
HCT: 22.3 % — ABNORMAL LOW (ref 36.0–46.0)
Hemoglobin: 7.2 g/dL — ABNORMAL LOW (ref 12.0–15.0)
MCH: 29.1 pg (ref 26.0–34.0)
MCHC: 32.3 g/dL (ref 30.0–36.0)
MCV: 90.3 fL (ref 80.0–100.0)
Platelets: 227 10*3/uL (ref 150–400)
RBC: 2.47 MIL/uL — ABNORMAL LOW (ref 3.87–5.11)
RDW: 15.9 % — ABNORMAL HIGH (ref 11.5–15.5)
WBC: 6 10*3/uL (ref 4.0–10.5)
nRBC: 0 % (ref 0.0–0.2)

## 2022-08-04 LAB — COMPREHENSIVE METABOLIC PANEL
ALT: 50 U/L — ABNORMAL HIGH (ref 0–44)
AST: 46 U/L — ABNORMAL HIGH (ref 15–41)
Albumin: 3.5 g/dL (ref 3.5–5.0)
Alkaline Phosphatase: 74 U/L (ref 38–126)
Anion gap: 10 (ref 5–15)
BUN: 102 mg/dL — ABNORMAL HIGH (ref 8–23)
CO2: 21 mmol/L — ABNORMAL LOW (ref 22–32)
Calcium: 8.9 mg/dL (ref 8.9–10.3)
Chloride: 107 mmol/L (ref 98–111)
Creatinine, Ser: 7.1 mg/dL — ABNORMAL HIGH (ref 0.44–1.00)
GFR, Estimated: 5 mL/min — ABNORMAL LOW (ref >60)
Glucose, Bld: 234 mg/dL — ABNORMAL HIGH (ref 70–99)
Potassium: 4.5 mmol/L (ref 3.5–5.1)
Sodium: 138 mmol/L (ref 135–145)
Total Bilirubin: 0.9 mg/dL (ref 0.3–1.2)
Total Protein: 7.1 g/dL (ref 6.5–8.1)

## 2022-08-04 LAB — CBG MONITORING, ED: Glucose-Capillary: 184 mg/dL — ABNORMAL HIGH (ref 70–99)

## 2022-08-04 LAB — PREPARE RBC (CROSSMATCH)

## 2022-08-04 MED ORDER — AMLODIPINE BESYLATE 5 MG PO TABS
5.0000 mg | ORAL_TABLET | Freq: Every day | ORAL | Status: DC
  Administered 2022-08-04 – 2022-08-08 (×5): 5 mg via ORAL
  Filled 2022-08-04 (×6): qty 1

## 2022-08-04 MED ORDER — SENNA 8.6 MG PO TABS
1.0000 | ORAL_TABLET | Freq: Two times a day (BID) | ORAL | Status: DC
  Administered 2022-08-05 – 2022-08-06 (×3): 8.6 mg via ORAL
  Filled 2022-08-04 (×6): qty 1

## 2022-08-04 MED ORDER — HYDROCHLOROTHIAZIDE 25 MG PO TABS: 25.0000 mg | ORAL_TABLET | Freq: Every day | ORAL | Status: DC

## 2022-08-04 MED ORDER — ACETAMINOPHEN 325 MG PO TABS: 650.0000 mg | ORAL_TABLET | Freq: Four times a day (QID) | ORAL | Status: DC | PRN

## 2022-08-04 MED ORDER — GLIPIZIDE ER 5 MG PO TB24: 5.0000 mg | ORAL_TABLET | Freq: Every day | ORAL | Status: DC

## 2022-08-04 MED ORDER — SODIUM CHLORIDE 0.9 % IV SOLN
10.0000 mL/h | Freq: Once | INTRAVENOUS | Status: AC
  Administered 2022-08-04: 10 mL/h via INTRAVENOUS

## 2022-08-04 MED ORDER — LINAGLIPTIN 5 MG PO TABS: 5.0000 mg | ORAL_TABLET | Freq: Every day | ORAL | Status: DC

## 2022-08-04 MED ORDER — SODIUM CHLORIDE 0.9 % IV BOLUS
1000.0000 mL | Freq: Once | INTRAVENOUS | Status: AC
  Administered 2022-08-04: 1000 mL via INTRAVENOUS

## 2022-08-04 MED ORDER — FERROUS SULFATE 325 (65 FE) MG PO TABS
325.0000 mg | ORAL_TABLET | Freq: Two times a day (BID) | ORAL | Status: DC
  Administered 2022-08-05 – 2022-08-10 (×10): 325 mg via ORAL
  Filled 2022-08-04 (×11): qty 1

## 2022-08-04 MED ORDER — FENTANYL CITRATE PF 50 MCG/ML IJ SOSY: 12.5000 ug | PREFILLED_SYRINGE | INTRAMUSCULAR | Status: DC | PRN

## 2022-08-04 MED ORDER — ROSUVASTATIN CALCIUM 20 MG PO TABS
40.0000 mg | ORAL_TABLET | Freq: Every day | ORAL | Status: DC
  Administered 2022-08-05 – 2022-08-10 (×6): 40 mg via ORAL
  Filled 2022-08-04 (×8): qty 2

## 2022-08-04 MED ORDER — ACETAMINOPHEN 650 MG RE SUPP: 650.0000 mg | Freq: Four times a day (QID) | RECTAL | Status: DC | PRN

## 2022-08-04 MED ORDER — PANTOPRAZOLE SODIUM 40 MG IV SOLR
40.0000 mg | Freq: Two times a day (BID) | INTRAVENOUS | Status: DC
  Administered 2022-08-04 – 2022-08-06 (×4): 40 mg via INTRAVENOUS
  Filled 2022-08-04 (×4): qty 10

## 2022-08-04 MED ORDER — INSULIN ASPART 100 UNIT/ML IJ SOLN
0.0000 [IU] | INTRAMUSCULAR | Status: DC
  Administered 2022-08-04: 3 [IU] via SUBCUTANEOUS
  Filled 2022-08-04: qty 1

## 2022-08-04 MED ORDER — INSULIN DETEMIR 100 UNIT/ML ~~LOC~~ SOLN
10.0000 [IU] | Freq: Every day | SUBCUTANEOUS | Status: DC
  Administered 2022-08-04: 10 [IU] via SUBCUTANEOUS
  Filled 2022-08-04: qty 0.1

## 2022-08-04 MED ORDER — AZITHROMYCIN 250 MG PO TABS
250.0000 mg | ORAL_TABLET | Freq: Once | ORAL | Status: AC
  Administered 2022-08-05: 250 mg via ORAL
  Filled 2022-08-04: qty 1

## 2022-08-04 MED ORDER — TRAZODONE HCL 50 MG PO TABS: 25.0000 mg | ORAL_TABLET | Freq: Every evening | ORAL | Status: DC | PRN

## 2022-08-04 MED ORDER — SODIUM CHLORIDE 0.9 % IV SOLN: INTRAVENOUS | Status: DC

## 2022-08-04 NOTE — Assessment & Plan Note (Signed)
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 She has an episode of AKI January 2023 that was prerenal azotemia with improvement and last Cr 1.71 in Dr. Charlestine Night office. She now presents with evidence of recurrent dehydration and AKI  Plan Hydration with NS at 100 c/hr  F/u BMet in AM

## 2022-08-04 NOTE — Assessment & Plan Note (Signed)
BP OK. Continue home meds- amlodipine but hold diuretic

## 2022-08-04 NOTE — ED Notes (Signed)
Patient transported to CT 

## 2022-08-04 NOTE — Assessment & Plan Note (Signed)
Last A1C in [facility] 8.2% in January 2023. She reports last A1C was in the 6% range at PCP office. She has been on Glucotrol, Januvia and metformin. Lat Mnday she had a hypoglycemic event and her Glucotrol was stopped, metformin reduced from 1g bid to 500 mg bid.   Plan Stop metformin - contra-indicated with CKD  Stop Gluctrol - risk of hypoglycemia   Stop Januvia for now  Basal insulin and ss while in-patient.   She is concerned about cost of meds.

## 2022-08-04 NOTE — ED Notes (Signed)
Pt has had multiple IV attempts by this RN and another Therapist, sports. A different RN is going to attempt

## 2022-08-04 NOTE — ED Notes (Signed)
Pt brought to ED rm 15 at this time, this RN now assuming care.

## 2022-08-04 NOTE — ED Triage Notes (Signed)
Pt presents after being sent by her doctor whom she saw on Monday for abnormal labs.  Feels it was about her kidneys.  Pt has no pain other than where they drew the blood from her at the office on Monday.

## 2022-08-04 NOTE — Assessment & Plan Note (Signed)
Multifactorial anemia - CKD, Fe deficiency and blood lost. She has had full evaluation by Hematology . She is now symptomatic with SOB and weakness.  Plan Complete 1 uPRBC transfusion as ordered by EDP  F/u H/H in AM, additional transfusion if Hgb < 8 and patient remains asymptomatic

## 2022-08-04 NOTE — Subjective & Objective (Signed)
Patricia Cross, a 76 y/o with h/o DM2, CKD related to DM with h/o AKI January 2023 2/2 dehydration, multi-factorial anemia - Fe deficiency, CKD, Esophageal stricture, hy/o nephrolithiasis, possible UGI bleed. She has followed up with Dr. Holley Raring for renal issues and [name] but has not had EGD since her last admission in January. She has been feeling weak over the past month. She reports black tarry stools. She presents to [facility] - ED for evaluation.

## 2022-08-04 NOTE — H&P (Signed)
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 She has followed up with Dr. Holley Raring for renal issues and [name] but has not had EGD since her last admission in January. She has been feeling weak over the past month. She reports black tarry stools. She presents to [facility] - ED for evaluation.   ED Course: 98.3  141/84  HR 94  RR 18. EDP exam notable for dry mucus membranes, heme positive stool on rectal exam. Lab: Glucose 234, BUN/CR  102/7.1, WBC 6.0, Hgb 7.2 (10.9 on 01/12/22). CT abd/pel non-obstructive stones noted, enlarged pancreatic duct - radiology recommends ERCP when patient stable. In EDP patient started on PPI infusion; 1 uPRBC transfusion ordered; IVF started. TR[facility] called to admit for continued evaluation and treatment   Review of Systems:  Review of Systems  Constitutional:  Positive for malaise/fatigue and weight loss. Negative for chills and fever.       30 lb weight loss over 12 months; 7-1- lbs weight loss since Jan '23  HENT: Negative.    Eyes: Negative.   Respiratory: Negative.    Cardiovascular: Negative.   Gastrointestinal:  Positive for blood in stool, diarrhea and melena. Negative for abdominal pain.  Genitourinary: Negative.   Musculoskeletal: Negative.   Skin: Negative.   Neurological:  Positive for weakness.  Endo/Heme/Allergies: Negative.   Psychiatric/Behavioral: Negative.      Past Medical History:  Diagnosis Date   Acute renal failure (ARF) ([place])    Anemia    Diabetes mellitus without complication ([place])  19/41/7408   High cholesterol    Hypertension     Past Surgical History:  Procedure Laterality Date   ABDOMINAL HYSTERECTOMY  1981   BREAST BIOPSY Left 2005   neg core   BREAST BIOPSY Right 2015   neg core-Fibroadenoma   COLONOSCOPY WITH PROPOFOL N/A 01/07/2016   Procedure: COLONOSCOPY WITH PROPOFOL;  Surgeon: Hulen Luster, MD;  Location: [facility] END[facility]COPY;  Service: Gastroenterology;  Laterality: N/A;   COLONOSCOPY WITH PROPOFOL N/A 05/09/2018   Procedure: COLONOSCOPY WITH PROPOFOL;  Surgeon: Toledo, Benay Pike, MD;  Location: [facility] ENDOSCOPY;  Service: Gastroenterology;  Laterality: N/A;   ESOPHAGOGASTRODUODENOSCOPY (EGD) WITH PROPOFOL N/A 05/09/2018   Procedure: ESOPHAGOGASTRODUODENOSCOPY (EGD) WITH PROPOFOL;  Surgeon: Toledo, Benay Pike, MD;  Location: [facility] ENDOSCOPY;  Service: Gastroenterology;  Laterality: N/A;   MICROLARYNGOSCOPY WITH CO2 LASER AND EXCISION OF VOCAL CORD LESION  1979   OOPHORECTOMY     one left, don't know which one   TUBAL LIGATION  1975    Soc Hx - single. ONe son, one daughter - died of pancreatic cancer. Still drives a school bus. Lives alone.    reports that she has never smoked. She has never used smokeless tobacco. She reports that she does not drink alcohol and does not use drugs.  No Known Allergies  Family History  Problem Relation Age of Onset   Hypertension Mother    Stroke Mother    Gout Father    Colon cancer Sister    Pancreatic cancer Daughter    Breast  cancer Neg Hx     Prior to Admission medications   Medication Sig Start Date End Date Taking? Authorizing Provider  amLODipine (NORVASC) 5 MG tablet Take 1 tablet (5 mg total) by mouth daily. 11/03/21   Loletha Grayer, MD  ASPIRIN 81 PO Aspirin 81 MG Oral Tablet QTY: 0 tablet Days: 0 Refills: 0  Written: 09/07/18 Patient Instructions: 09/07/18   [provider]  ferrous sulfate 325 (65 FE) MG EC tablet Take 1 tablet (325 mg total) by mouth 2 (two) times daily with a meal. 05/14/22   Earlie Server, MD  glipiZIDE (GLUCOTROL XL) 5 MG 24 hr tablet Take 1 tablet (5 mg total) by mouth daily with breakfast. 11/03/21   Loletha Grayer, MD  hydrochlorothiazide (HYDRODIURIL) 25 MG tablet hydroCHLOROthiazide 25 MG Oral Tablet QTY: 90 tablet Days: 90 Refills: 1  Written: 09/01/21 Patient Instructions: Take 1 tablet by mouth once daily 09/01/21   [provider]  Multiple Vitamin (MULTIVITAMIN) tablet Take 1 tablet by mouth daily.    [provider]  Multiple Vitamins-Minerals (CENTRUM SILVER) tablet Centrum Silver Oral Tablet QTY: 0 tablet Days: 0 Refills: 0  Written: 11/12/15 Patient Instructions: 11/12/15   [provider]  pantoprazole (PROTONIX) 40 MG tablet Take 1 tablet (40 mg total) by mouth daily. 11/03/21   Loletha Grayer, MD  rosuvastatin (CRESTOR) 40 MG tablet Take 40 mg by mouth daily. 12/03/21   [provider]  sitaGLIPtin (JANUVIA) 50 MG tablet Take 1 tablet (50 mg total) by mouth daily. 11/03/21   Loletha Grayer, MD    Physical Exam: Vitals:   08/04/22 2031 08/04/22 2130 08/04/22 2200 08/04/22 2248  BP: (!) 141/54 (!) 166/59 (!) 147/54 (!) 148/56  Pulse: 94 83 89 80  Resp: '18 20  18  '$ Temp: 98.3 F (36.8 C)   98.2 F (36.8 C)  TempSrc:    Oral  SpO2: 99% 100% 98% 100%    Physical Exam Vitals and nursing note reviewed.  Constitutional:      General: She is not in acute distress.    Appearance: Normal appearance. She is not ill-appearing.     Comments: Looks younger than stated age  HENT:     Head: Normocephalic and atraumatic.     Mouth/Throat:     Mouth: Mucous membranes are dry.     Pharynx: Oropharynx is clear.     Comments: Good dentition Eyes:     Extraocular Movements: Extraocular movements intact.     Conjunctiva/sclera: Conjunctivae normal.     Pupils: Pupils are equal, round, and reactive to light.  Cardiovascular:     Rate and Rhythm: Normal rate and regular rhythm.  Pulmonary:     Effort: Pulmonary effort is  normal.     Breath sounds: Normal breath sounds. No wheezing or rhonchi.  Abdominal:     General: Bowel sounds are normal. There is no distension.     Palpations: Abdomen is soft.     Tenderness: There is no abdominal tenderness. There is no guarding.  Musculoskeletal:        General: Normal range of motion.     Cervical back: Normal range of motion and neck supple.  Skin:    General: Skin is warm and dry.  Neurological:     General: No focal deficit present.     Mental Status: She is alert and oriented to person, place, and time.  Psychiatric:        Mood and Affect: Mood normal.  Behavior: Behavior normal.      Labs on Admission: I have personally reviewed following labs and imaging studies  CBC: Recent Labs  Lab 08/04/22 1303  WBC 6.0  HGB 7.2*  HCT 22.3*  MCV 90.3  PLT 478   Basic Metabolic Panel: Recent Labs  Lab 08/04/22 1303  NA 138  K 4.5  CL 107  CO2 21*  GLUCOSE 234*  BUN 102*  CREATININE 7.10*  CALCIUM 8.9   GFR: CrCl cannot be calculated (Unknown ideal weight.). Liver Function Tests: Recent Labs  Lab 08/04/22 1303  AST 46*  ALT 50*  ALKPHOS 74  BILITOT 0.9  PROT 7.1  ALBUMIN 3.5   No results for input(s): "LIPASE", "AMYLASE" in the last 168 hours. No results for input(s): "AMMONIA" in the last 168 hours. Coagulation Profile: No results for input(s): "INR", "PROTIME" in the last 168 hours. Cardiac Enzymes: No results for input(s): "CKTOTAL", "CKMB", "CKMBINDEX", "TROPONINI" in the last 168 hours. BNP (last 3 results) No results for input(s): "PROBNP" in the last 8760 hours. HbA1C: No results for input(s): "HGBA1C" in the last 72 hours. CBG: No results for input(s): "GLUCAP" in the last 168 hours. Lipid Profile: No results for input(s): "CHOL", "HDL", "LDLCALC", "TRIG", "CHOLHDL", "LDLDIRECT" in the last 72 hours. Thyroid Function Tests: No results for input(s): "TSH", "T4TOTAL", "FREET4", "T3FREE", "THYROIDAB" in the last 72  hours. Anemia Panel: No results for input(s): "VITAMINB12", "FOLATE", "FERRITIN", "TIBC", "IRON", "RETICCTPCT" in the last 72 hours. Urine analysis:    Component Value Date/Time   COLORURINE STRAW (A) 10/30/2021 0852   APPEARANCEUR CLEAR 10/30/2021 0852   LABSPEC 1.015 10/30/2021 0852   PHURINE 8.0 10/30/2021 0852   GLUCOSEU NEGATIVE 10/30/2021 0852   HGBUR SMALL (A) 10/30/2021 0852   BILIRUBINUR NEGATIVE 10/30/2021 0852   KETONESUR NEGATIVE 10/30/2021 0852   PROTEINUR NEGATIVE 10/30/2021 0852   NITRITE NEGATIVE 10/30/2021 0852   LEUKOCYTESUR SMALL (A) 10/30/2021 0852    Radiological Exams on Admission: I have personally reviewed images CT Renal Stone Study  Result Date: 08/04/2022 CLINICAL DATA:  Nephrolithiasis CR+ and hx stone EXAM: CT ABDOMEN AND PELVIS WITHOUT CONTRAST TECHNIQUE: Multidetector CT imaging of the abdomen and pelvis was performed following the standard protocol without IV contrast. RADIATION DOSE REDUCTION: This exam was performed according to the departmental dose-optimization program which includes automated exposure control, adjustment of the mA and/or kV according to patient size and/or use of iterative reconstruction technique. COMPARISON:  None Available. FINDINGS: Lower chest: Severe atherosclerotic plaque. Coronary artery calcification. No acute abnormality. Hepatobiliary: No focal liver abnormality. No gallstones, gallbladder wall thickening, or pericholecystic fluid. No biliary dilatation. Pancreas: No focal lesion. Normal pancreatic contour. No surrounding inflammatory changes. Nonspecific prominent proximal main pancreatic measuring up to 6 mm. No main pancreatic ductal dilatation. Spleen: Normal in size without focal abnormality. Adrenals/Urinary Tract: No adrenal nodule bilaterally. Bilateral amorphous calcifications that may represent calcified stones. No hydronephrosis. Hyperdense 1.2 cm right renal lesion with a density of 58 Hounsfield units. No  ureterolithiasis or hydroureter. The urinary bladder is unremarkable. Stomach/Bowel: Stomach is within normal limits. No evidence of bowel wall thickening or dilatation. Appendiceal stump with appendicolith noted within its lumen distally. No inflammatory changes along the right lower quadrant to suggest appendicitis. Vascular/Lymphatic: No abdominal aorta or iliac aneurysm. Severe atherosclerotic plaque of the aorta and its branches. No abdominal, pelvic, or inguinal lymphadenopathy. Reproductive: Status post hysterectomy. No adnexal masses. Other: No intraperitoneal free fluid. No intraperitoneal free gas. No organized fluid collection. Musculoskeletal: No  abdominal wall hernia or abnormality. No suspicious lytic or blastic osseous lesions. No acute displaced fracture. L5-S1 intervertebral disc space vacuum phenomenon. IMPRESSION: 1. Bilateral amorphous calcifications that may represent nonobstructive calcified stones. 2. Nonspecific prominent proximal main pancreatic measuring up to 6 mm. No associated biliary ductal dilatation. Question IPMN. When the patient is clinically stable and able to follow directions and hold their breath (preferably as an outpatient) further evaluation with dedicated nonemergent MRI pancreatic protocol should be considered. 3. Indeterminate 1.2 cm right renal lesion. Finding can be further evaluated on the MRI. 4.  Aortic Atherosclerosis (ICD10-I70.0). Electronically Signed   By: Iven Finn M.D.   On: 08/04/2022 18:23    EKG: I have personally reviewed EKG: no EKG in EPIC  Assessment/Plan Principal Problem:   AKI (acute kidney injury) ([place]) Active Problems:   GI bleeding   Hypertension   Type 2 diabetes mellitus with hyperglycemia, without long-term current use of insulin (HCC)   Anemia of chronic disease    Assessment and Plan: * AKI (acute kidney injury) ([place]) Patient with CKD 2/2 diabetes. She has an episode of AKI January 2023 that was prerenal azotemia with  improvement and last Cr 1.71 in Dr. Charlestine Night office. She now presents with evidence of recurrent dehydration and AKI  Plan Hydration with NS at 100 c/hr  F/u BMet in AM  GI bleeding Patient in January 2023 with possible UGI bleed. She has h/o Esophageal stricture and abnormal swallow. Now with h/o melanic stools and heme positive stool on exam by EDP.   Plan Continue IV PPI  GI consult - [facility]  Anemia of chronic disease Multifactorial anemia - CKD, Fe deficiency and blood lost. She has had full evaluation by Hematology . She is now symptomatic with SOB and weakness.  Plan Complete 1 uPRBC transfusion as ordered by EDP  F/u H/H in AM, additional transfusion if Hgb < 8 and patient remains asymptomatic  Type 2 diabetes mellitus with hyperglycemia, without long-term current use of insulin (HCC) Last A1C in EPIC 8.2% in January 2023. She reports last A1C was in the 6% range at PCP office. She has been on Glucotrol, Januvia and metformin. Lat Mnday she had a hypoglycemic event and her Glucotrol was stopped, metformin reduced from 1g bid to 500 mg bid.   Plan Stop metformin - contra-indicated with CKD  Stop Gluctrol - risk of hypoglycemia   Stop Januvia for now  Basal insulin and ss while in-patient.   She is concerned about cost of meds.   Hypertension BP OK. Continue home meds- amlodipine but hold diuretic       DVT prophylaxis:  TED stockings Code Status: Full Code Family Communication: son present during exam - answered all questions  Disposition Plan: home when stable  Consults called: Gastro - sent secure chat to Dr Marius Ditch - on call  Admission status: Inpatient, Med-Surg   Adella Hare, MD [facility] 08/04/2022, 11:03 PM

## 2022-08-04 NOTE — ED Notes (Signed)
Patient transported back from CT 

## 2022-08-04 NOTE — ED Notes (Signed)
ED Provider at bedside. Rectal exam performed by MD and hemmocult was done and is positive.

## 2022-08-04 NOTE — Assessment & Plan Note (Signed)
Patient in January 2023 with possible UGI bleed. She has h/o Esophageal stricture and abnormal swallow. Now with h/o melanic stools and heme positive stool on exam by EDP.   Plan Continue IV PPI  GI consult - [name]

## 2022-08-04 NOTE — ED Notes (Signed)
MD Norins at bedside.

## 2022-08-04 NOTE — ED Provider Triage Note (Signed)
Emergency Medicine Provider Triage Evaluation Note  Patricia Cross , a 76 y.o. female  was evaluated in triage.  Pt complains of was told by doctor's office that labs first of the week were abnormal.  Review of Systems  Positive: Denies sx Negative: No N/V/D  Physical Exam  BP (!) 148/55 (BP Location: Left Arm)   Pulse 94   Temp 97.8 F (36.6 C) (Oral)   Resp 18   SpO2 100%  Gen:   Awake, no distress  talkative Resp:  Normal effort  MSK:   Moves extremities without difficulty  Other:    Medical Decision Making  Medically screening exam initiated at 1:02 PM.  Appropriate orders placed.  Patricia Cross was informed that the remainder of the evaluation will be completed by another provider, this initial triage assessment does not replace that evaluation, and the importance of remaining in the ED until their evaluation is complete.     Johnn Hai, PA-C 08/04/22 1303

## 2022-08-04 NOTE — ED Provider Notes (Addendum)
[facility] Provider Note    Event Date/Time   First MD Initiated Contact with Patient 08/04/22 1729     (approximate)   History   Abnormal Labs   HPI  Patricia Cross is a 76 y.o. female   Past medical history of iron deficiency anemia, acute renal failure thought to be due to dehydration, kidney stones, GI bleeding who presents to the emergency department with several lab abnormalities including an elevated creatinine 6+ and anemia in the sevens.  She and her family are here to provide history.  She states that she has been feeling generalized weakness, exertional dyspnea, worsening over several weeks.  She has been experiencing some tarry black stools and diarrhea, some nausea, no abdominal pain.  She has had very poor p.o. intake.  She denies NSAID use or alcohol use.  She does not take blood thinners.  She has no abdominal or flank pain or dysuria.  She has not vomited.  On review of past medical history on medical chart, she was admitted in January 2023 for acute kidney failure and anemia, upper GI bleeding but decision at that time to defer upper endoscopy and she was prescribed Protonix.  She had an appointment with hematology and those notes dated April 2023 reveal that acute kidney injury was thought to be secondary to prerenal azotemia with dehydration and poor p.o. intake which improved after hydration and oncological work-up was unrevealing at that time.  She was started on iron supplementation and was determined also to have anemia secondary to CKD but no urgent need of starting EPO at that time.  She has had a CT scan in January of the abdomen pelvis that showed a partially obstructive stone impacted in the right proximal ureter.  History was obtained via the patient, her family members, review of external medical notes as above.      Physical Exam   Triage Vital Signs: ED Triage Vitals [08/04/22 1257]  Enc Vitals Group     BP (!) 148/55      Pulse Rate 94     Resp 18     Temp 97.8 F (36.6 C)     Temp Source Oral     SpO2 100 %     Weight      Height      Head Circumference      Peak Flow      Pain Score      Pain Loc      Pain Edu?      Excl. in [place]?     Most recent vital signs: Vitals:   08/04/22 1257 08/04/22 1737  BP: (!) 148/55 (!) 155/63  Pulse: 94 90  Resp: 18 18  Temp: 97.8 F (36.6 C) 97.9 F (36.6 C)  SpO2: 100% 100%    General: Awake, no distress.  Alert and oriented and pleasant. CV:  Poor skin turgor and dry mucous membranes. Resp:  Normal effort.  No tachypnea, lungs clear Abd:  No distention.  Nontender abdomen and flank Other:  Rectal exam with brown stool but is guaiac positive, no blood or melena   ED Results / Procedures / Treatments   Labs (all labs ordered are listed, but only abnormal results are displayed) Labs Reviewed  COMPREHENSIVE METABOLIC PANEL - Abnormal; Notable for the following components:      Result Value   CO2 21 (*)    Glucose, Bld 234 (*)    BUN 102 (*)  Creatinine, Ser 7.10 (*)    AST 46 (*)    ALT 50 (*)    GFR, Estimated 5 (*)    All other components within normal limits  CBC - Abnormal; Notable for the following components:   RBC 2.47 (*)    Hemoglobin 7.2 (*)    HCT 22.3 (*)    RDW 15.9 (*)    All other components within normal limits  URINE CULTURE  URINALYSIS, ROUTINE W REFLEX MICROSCOPIC  PREPARE RBC (CROSSMATCH)  TYPE AND SCREEN     I reviewed labs and they are notable for hemoglobin 7.2 and creatinine 7.1   RADIOLOGY I independently reviewed and interpreted Ct renal see no obvious hydronephrosis   PROCEDURES:  Critical Care performed: Yes, see critical care procedure note(s)  .Critical Care  Performed by: Lucillie Garfinkel, MD Authorized by: Lucillie Garfinkel, MD   Critical care provider statement:    Critical care time (minutes):  30   Critical care was necessary to treat or prevent imminent or life-threatening deterioration of the  following conditions:  Metabolic crisis (Profound anemia requiring blood transfusion, acute renal failure)   Critical care was time spent personally by me on the following activities:  Development of treatment plan with patient or surrogate, discussions with consultants, evaluation of patient's response to treatment, examination of patient, ordering and review of laboratory studies, ordering and review of radiographic studies, ordering and performing treatments and interventions, pulse oximetry, re-evaluation of patient's condition and review of old charts    MEDICATIONS ORDERED IN ED: Medications  0.9 %  sodium chloride infusion (has no administration in time range)  pantoprazole (PROTONIX) injection 40 mg (has no administration in time range)  sodium chloride 0.9 % bolus 1,000 mL (has no administration in time range)    Consultants:  I spoke with hospitalist for admission and regarding care plan for this patient.   IMPRESSION / MDM / ASSESSMENT AND PLAN / ED COURSE  I reviewed the triage vital signs and the nursing notes.                              Differential diagnosis includes, but is not limited to, acute kidney failure due to dehydration, obstruction.  Anemia due to CKD, blood loss, iron deficiency.   The patient is on the cardiac monitor to evaluate for evidence of arrhythmia and/or significant heart rate changes.  MDM: Differential diagnosis as above will further elucidate with CT of the flank to rule out obstructive uropathy.  She appears dehydrated and has clinical story of diarrhea and poor p.o. intake, will give IV crystalloid fluid resuscitation at this time.  Her anemia may be due to iron deficiency, GI bleeding, CKD.  She is symptomatic with a hemoglobin of 7.2 and guaiac positive stool.  We will give pantoprazole at this time.  No signs of large hemorrhage and her hemodynamics are appropriate and reassuring.  We will also give a blood transfusion 1 unit packed red blood  cells.  She will need admission.  Her CT flank showed no obstructing stone, no hydronephrosis but did show a lesion in the pancreas that will need further evaluation.   She states that she is on day 4 of 5 of azithromycin for upper respiratory infection.  I ordered her dose for her tomorrow morning.  Patient's presentation is most consistent with acute presentation with potential threat to life or bodily function.       FINAL  CLINICAL IMPRESSION(S) / ED DIAGNOSES   Final diagnoses:  Acute kidney injury ([place])  Symptomatic anemia  Guaiac positive stools  Diarrhea, unspecified type     Rx / DC Orders   ED Discharge Orders     None        Note:  This document was prepared using Dragon voice recognition software and may include unintentional dictation errors.    Lucillie Garfinkel, MD 08/04/22 1901    Lucillie Garfinkel, MD 08/04/22 2042

## 2022-08-05 ENCOUNTER — Inpatient Hospital Stay: Payer: Medicare Other | Admitting: General Practice

## 2022-08-05 ENCOUNTER — Encounter: Payer: Self-pay | Admitting: Internal Medicine

## 2022-08-05 ENCOUNTER — Encounter
Admission: EM | Disposition: A | Discharge status: Home / Self Care | Payer: Self-pay | Source: Home / Self Care | Attending: Internal Medicine

## 2022-08-05 DIAGNOSIS — K922 Gastrointestinal hemorrhage, unspecified: Secondary | ICD-10-CM | POA: Diagnosis not present

## 2022-08-05 DIAGNOSIS — N179 Acute kidney failure, unspecified: Principal | ICD-10-CM

## 2022-08-05 HISTORY — PX: ESOPHAGOGASTRODUODENOSCOPY (EGD) WITH PROPOFOL: SHX5813

## 2022-08-05 LAB — CBG MONITORING, ED
Glucose-Capillary: 39 mg/dL — CL (ref 70–99)
Glucose-Capillary: 174 mg/dL — ABNORMAL HIGH (ref 70–99)
Glucose-Capillary: 150 mg/dL — ABNORMAL HIGH (ref 70–99)
Glucose-Capillary: 116 mg/dL — ABNORMAL HIGH (ref 70–99)

## 2022-08-05 LAB — BASIC METABOLIC PANEL
Anion gap: 8 (ref 5–15)
BUN: 94 mg/dL — ABNORMAL HIGH (ref 8–23)
CO2: 21 mmol/L — ABNORMAL LOW (ref 22–32)
Calcium: 8.3 mg/dL — ABNORMAL LOW (ref 8.9–10.3)
Chloride: 113 mmol/L — ABNORMAL HIGH (ref 98–111)
Creatinine, Ser: 6.39 mg/dL — ABNORMAL HIGH (ref 0.44–1.00)
GFR, Estimated: 6 mL/min — ABNORMAL LOW (ref >60)
Glucose, Bld: 45 mg/dL — ABNORMAL LOW (ref 70–99)
Potassium: 3.9 mmol/L (ref 3.5–5.1)
Sodium: 142 mmol/L (ref 135–145)

## 2022-08-05 LAB — URINALYSIS, ROUTINE W REFLEX MICROSCOPIC
Bilirubin Urine: NEGATIVE
Glucose, UA: 50 mg/dL — AB
Ketones, ur: NEGATIVE mg/dL
Leukocytes,Ua: NEGATIVE
Nitrite: NEGATIVE
Protein, ur: NEGATIVE mg/dL
Specific Gravity, Urine: 1.008 (ref 1.005–1.030)
pH: 6 (ref 5.0–8.0)

## 2022-08-05 LAB — GLUCOSE, CAPILLARY
Glucose-Capillary: 45 mg/dL — ABNORMAL LOW (ref 70–99)
Glucose-Capillary: 129 mg/dL — ABNORMAL HIGH (ref 70–99)
Glucose-Capillary: 114 mg/dL — ABNORMAL HIGH (ref 70–99)
Glucose-Capillary: 190 mg/dL — ABNORMAL HIGH (ref 70–99)

## 2022-08-05 LAB — HEMOGLOBIN A1C
Hgb A1c MFr Bld: 6.5 % — ABNORMAL HIGH (ref 4.8–5.6)
Mean Plasma Glucose: 139.85 mg/dL

## 2022-08-05 LAB — HEMOGLOBIN AND HEMATOCRIT, BLOOD
HCT: 22 % — ABNORMAL LOW (ref 36.0–46.0)
HCT: 30.7 % — ABNORMAL LOW (ref 36.0–46.0)
Hemoglobin: 7.4 g/dL — ABNORMAL LOW (ref 12.0–15.0)
Hemoglobin: 10.3 g/dL — ABNORMAL LOW (ref 12.0–15.0)

## 2022-08-05 LAB — PREPARE RBC (CROSSMATCH)

## 2022-08-05 SURGERY — ESOPHAGOGASTRODUODENOSCOPY (EGD) WITH PROPOFOL
Anesthesia: General

## 2022-08-05 MED ORDER — ONDANSETRON HCL 4 MG/2ML IJ SOLN: 4.0000 mg | Freq: Four times a day (QID) | INTRAMUSCULAR | Status: DC | PRN

## 2022-08-05 MED ORDER — INSULIN ASPART 100 UNIT/ML IJ SOLN
0.0000 [IU] | Freq: Every day | INTRAMUSCULAR | Status: DC
  Administered 2022-08-06 – 2022-08-07 (×2): 4 [IU] via SUBCUTANEOUS
  Administered 2022-08-08: 2 [IU] via SUBCUTANEOUS
  Filled 2022-08-05 (×2): qty 1

## 2022-08-05 MED ORDER — DEXTROSE 50 % IV SOLN
50.0000 mL | Freq: Once | INTRAVENOUS | Status: AC
  Administered 2022-08-05: 50 mL via INTRAVENOUS
  Filled 2022-08-05: qty 50

## 2022-08-05 MED ORDER — PROPOFOL 10 MG/ML IV BOLUS
INTRAVENOUS | Status: DC | PRN
  Administered 2022-08-05: 70 mg via INTRAVENOUS

## 2022-08-05 MED ORDER — LIDOCAINE HCL (CARDIAC) PF 100 MG/5ML IV SOSY
PREFILLED_SYRINGE | INTRAVENOUS | Status: DC | PRN
  Administered 2022-08-05: 100 mg via INTRAVENOUS

## 2022-08-05 MED ORDER — DEXTROSE-NACL 5-0.9 % IV SOLN: INTRAVENOUS | Status: DC

## 2022-08-05 MED ORDER — GLUCAGON HCL RDNA (DIAGNOSTIC) 1 MG IJ SOLR: 1.0000 mg | Freq: Once | INTRAMUSCULAR | Status: DC

## 2022-08-05 MED ORDER — TRAZODONE HCL 50 MG PO TABS
50.0000 mg | ORAL_TABLET | Freq: Every evening | ORAL | Status: DC | PRN
  Filled 2022-08-05: qty 1

## 2022-08-05 MED ORDER — SODIUM CHLORIDE 0.9 % IV SOLN: INTRAVENOUS | Status: DC | PRN

## 2022-08-05 MED ORDER — IPRATROPIUM-ALBUTEROL 0.5-2.5 (3) MG/3ML IN SOLN: 3.0000 mL | RESPIRATORY_TRACT | Status: DC | PRN

## 2022-08-05 MED ORDER — INSULIN ASPART 100 UNIT/ML IJ SOLN
0.0000 [IU] | Freq: Three times a day (TID) | INTRAMUSCULAR | Status: DC
  Administered 2022-08-06 (×2): 2 [IU] via SUBCUTANEOUS
  Administered 2022-08-06: 3 [IU] via SUBCUTANEOUS
  Administered 2022-08-07: 2 [IU] via SUBCUTANEOUS
  Administered 2022-08-07: 5 [IU] via SUBCUTANEOUS
  Administered 2022-08-07: 6 [IU] via SUBCUTANEOUS
  Administered 2022-08-08 – 2022-08-09 (×4): 1 [IU] via SUBCUTANEOUS
  Administered 2022-08-09 (×2): 2 [IU] via SUBCUTANEOUS
  Administered 2022-08-10: 1 [IU] via SUBCUTANEOUS
  Filled 2022-08-05 (×13): qty 1

## 2022-08-05 MED ORDER — SODIUM CHLORIDE 0.9% IV SOLUTION
Freq: Once | INTRAVENOUS | Status: AC
  Filled 2022-08-05: qty 250

## 2022-08-05 MED ORDER — DEXTROSE-NACL 5-0.45 % IV SOLN: INTRAVENOUS | Status: DC

## 2022-08-05 MED ORDER — GUAIFENESIN 100 MG/5ML PO LIQD
5.0000 mL | ORAL | Status: DC | PRN
  Administered 2022-08-08: 5 mL via ORAL
  Filled 2022-08-05: qty 10

## 2022-08-05 MED ORDER — HYDRALAZINE HCL 20 MG/ML IJ SOLN: 10.0000 mg | INTRAMUSCULAR | Status: DC | PRN

## 2022-08-05 MED ORDER — SENNOSIDES-DOCUSATE SODIUM 8.6-50 MG PO TABS: 1.0000 | ORAL_TABLET | Freq: Every evening | ORAL | Status: DC | PRN

## 2022-08-05 MED ORDER — METOPROLOL TARTRATE 5 MG/5ML IV SOLN: 5.0000 mg | INTRAVENOUS | Status: DC | PRN

## 2022-08-05 MED ORDER — PROPOFOL 500 MG/50ML IV EMUL
INTRAVENOUS | Status: DC | PRN
  Administered 2022-08-05: 150 ug/kg/min via INTRAVENOUS

## 2022-08-05 NOTE — Op Note (Signed)
[facility] Gastroenterology Patient Name: Patricia Cross Procedure Date: 08/05/2022 2:04 PM MRN: [id number] Account #: [id number] Date of Birth: Mar 20, 1946 Admit Type: Inpatient Age: 76 Room: [facility] ENDO ROOM 2 Gender: Female Note Status: Finalized Instrument Name: Upper Endoscope [id number] Procedure:             Upper GI endoscopy Indications:           Melena Providers:             [name] MD, MD Referring MD:          Perrin Maltese, MD (Referring MD) Medicines:             Propofol per Anesthesia Complications:         No immediate complications. Procedure:             Pre-Anesthesia Assessment:                        - Prior to the procedure, a History and Physical was                         performed, and patient medications and allergies were                         reviewed. The patient's tolerance of previous                         anesthesia was also reviewed. The risks and benefits                         of the procedure and the sedation options and risks                         were discussed with the patient. All questions were                         answered, and informed consent was obtained. Prior                         Anticoagulants: The patient has taken no previous                         anticoagulant or antiplatelet agents. ASA Grade                         Assessment: II - A patient with mild systemic disease.                         After reviewing the risks and benefits, the patient                         was deemed in satisfactory condition to undergo the                         procedure.                        After obtaining informed consent, the endoscope was  passed under direct vision. Throughout the procedure,                         the patient's blood pressure, pulse, and oxygen                         saturations were monitored continuously. The Endoscope                         was introduced  through the mouth, and advanced to the                         second part of duodenum. The upper GI endoscopy was                         accomplished without difficulty. The patient tolerated                         the procedure well. Findings:      A medium-sized hiatal hernia was present.      One benign-appearing, intrinsic mild stenosis was found at the       gastroesophageal junction. The stenosis was traversed.      The stomach was normal.      A single Large-sized sessile polyp with no bleeding was found in the       duodenal bulb. Biopsies were taken with a cold forceps for histology. Impression:            - Medium-sized hiatal hernia.                        - Benign-appearing esophageal stenosis.                        - Normal stomach.                        - A large single duodenal polyp. Biopsied. Recommendation:        - Return patient to hospital ward for ongoing care.                        - Resume previous diet.                        - Continue present medications.                        - Await pathology results.                        - Follow Hb.                        If stable then follow up with Dr. Alice Reichert as an                         outpatient for a possible colonoscopy.                        If polyp in duodenum adenomatous then would need  removal. Procedure Code(s):     --- Professional ---                        [phone], Esophagogastroduodenoscopy, flexible,                         transoral; with biopsy, single or multiple Diagnosis Code(s):     --- Professional ---                        K92.1, Melena (includes Hematochezia)                        K31.7, Polyp of stomach and duodenum                        K22.2, Esophageal obstruction CPT copyright 2019 American Medical Association. All rights reserved. The codes documented in this report are preliminary and upon coder review may  be revised to meet current compliance  requirements. [name] MD, MD 08/05/2022 3:43:47 PM This report has been signed electronically. Number of Addenda: 0 Note Initiated On: 08/05/2022 2:04 PM Estimated Blood Loss:  Estimated blood loss: none.      [facility]

## 2022-08-05 NOTE — Plan of Care (Signed)

## 2022-08-05 NOTE — Progress Notes (Signed)
EGD without any sign of any active or old bleeding.  The patient had a normal colonoscopy in 2019 by Dr. Alice Reichert.  Follow Hb.  If stable then follow up with Dr. Alice Reichert as an outpatient for a possible colonoscopy. If polyp in duodenum adenomatous then would need removal as an outpatient.  If the patient's hemoglobin continues to drop then we can consider aninpatient colonoscopy.

## 2022-08-05 NOTE — Anesthesia Procedure Notes (Signed)
Date/Time: 08/05/2022 3:32 PM  Performed by: Lily Peer, Kyllian Clingerman, CRNAPre-anesthesia Checklist: Patient identified, Emergency Drugs available, Suction available, Patient being monitored and Timeout performed Patient Re-evaluated:Patient Re-evaluated prior to induction Oxygen Delivery Method: Nasal cannula Induction Type: IV induction

## 2022-08-05 NOTE — Anesthesia Preprocedure Evaluation (Signed)
Anesthesia Evaluation  Patient identified by MRN, date of birth, ID band Patient awake    Reviewed: Allergy & Precautions, NPO status , Patient's Chart, lab work & pertinent test results  History of Anesthesia Complications Negative for: history of anesthetic complications  Airway Mallampati: III  TM Distance: >3 FB Neck ROM: full    Dental  (+) Teeth Intact   Pulmonary neg pulmonary ROS,    Pulmonary exam normal        Cardiovascular hypertension, On Medications negative cardio ROS Normal cardiovascular exam     Neuro/Psych negative neurological ROS  negative psych ROS   GI/Hepatic negative GI ROS, Neg liver ROS, Esophageal stricture  Upper GI bleed     Endo/Other  negative endocrine ROSdiabetes  Renal/GU ARFRenal diseaseCKD   negative genitourinary   Musculoskeletal   Abdominal   Peds  Hematology  (+) Blood dyscrasia, anemia , Hgb 7.4   Anesthesia Other Findings Past Medical History: No date: Acute renal failure (ARF) (HCC) No date: Anemia 10/18/1999: Diabetes mellitus without complication (HCC) No date: High cholesterol No date: Hypertension  Past Surgical History: 1981: ABDOMINAL HYSTERECTOMY 2005: BREAST BIOPSY; Left     Comment:  neg core 2015: BREAST BIOPSY; Right     Comment:  neg core-Fibroadenoma 01/07/2016: COLONOSCOPY WITH PROPOFOL; N/A     Comment:  Procedure: COLONOSCOPY WITH PROPOFOL;  Surgeon: Hulen Luster, MD;  Location[facility] ENDOSCOPY;  Service:               Gastroenterology;  Laterality: N/A; 05/09/2018: COLONOSCOPY WITH PROPOFOL; N/A     Comment:  Procedure: COLONOSCOPY WITH PROPOFOL;  Surgeon: Toledo,               Benay Pike, MD;  Location: [facility] ENDOSCOPY;  Service:               Gastroenterology;  Laterality: N/A; 05/09/2018: ESOPHAGOGASTRODUODENOSCOPY (EGD) WITH PROPOFOL; N/A     Comment:  Procedure: ESOPHAGOGASTRODUODENOSCOPY (EGD) WITH               PROPOFOL;   Surgeon: Toledo, Benay Pike, MD;  Location:               [facility] ENDOSCOPY;  Service: Gastroenterology;  Laterality:               N/A; 1979: MICROLARYNGOSCOPY WITH CO2 LASER AND EXCISION OF VOCAL CORD  LESION No date: OOPHORECTOMY     Comment:  one left, don't know which one 1975: TUBAL LIGATION  BMI    Body Mass Index: 18.78 kg/m      Reproductive/Obstetrics negative OB ROS                            Anesthesia Physical Anesthesia Plan  ASA: 3  Anesthesia Plan: General   Post-op Pain Management: Minimal or no pain anticipated   Induction: Intravenous  PONV Risk Score and Plan: 2 and Propofol infusion and TIVA  Airway Management Planned: Natural Airway and Nasal Cannula  Additional Equipment:   Intra-op Plan:   Post-operative Plan:   Informed Consent: I have reviewed the patients History and Physical, chart, labs and discussed the procedure including the risks, benefits and alternatives for the proposed anesthesia with the patient or authorized representative who has indicated his/her understanding and acceptance.     Dental Advisory Given  Plan Discussed with: Anesthesiologist, CRNA and Surgeon  Anesthesia Plan Comments: (Patient consented for risks of anesthesia including but not limited to:  - adverse reactions to medications - risk of airway placement if required - damage to eyes, teeth, lips or other oral mucosa - nerve damage due to positioning  - sore throat or hoarseness - Damage to heart, brain, nerves, lungs, other parts of body or loss of life  Patient voiced understanding.)        Anesthesia Quick Evaluation

## 2022-08-05 NOTE — Progress Notes (Signed)
       CROSS COVER NOTE  NAME: Patricia Cross MRN: [id number] DOB : November 19, 1945    Date of Service   DATE10/20/2023  HPI/Events of Note   Nurse reports hypoglycemic event 68. D50 given per proocol  Assessment and  Interventions   Assessment:  Plan: Change IVF to D5NS at 100 CBG every hour for  hours

## 2022-08-05 NOTE — Anesthesia Postprocedure Evaluation (Signed)
Anesthesia Post Note  Patient: Patricia Cross  Procedure(s) Performed: ESOPHAGOGASTRODUODENOSCOPY (EGD) WITH PROPOFOL  Patient location during evaluation: PACU Anesthesia Type: General Level of consciousness: awake and alert, oriented and patient cooperative Pain management: pain level controlled Vital Signs Assessment: post-procedure vital signs reviewed and stable Respiratory status: spontaneous breathing, nonlabored ventilation and respiratory function stable Cardiovascular status: blood pressure returned to baseline and stable Postop Assessment: adequate PO intake Anesthetic complications: no   No notable events documented.   Last Vitals:  Vitals:   08/05/22 1553 08/05/22 1603  BP: (!) 119/53 (!) 141/87  Pulse: 89 99  Resp: 15 15  Temp:    SpO2: 100% 99%    Last Pain:  Vitals:   08/05/22 1603  TempSrc:   PainSc: 0-No pain                 Darrin Nipper

## 2022-08-05 NOTE — Progress Notes (Signed)
PROGRESS NOTE    Patricia Cross  YFV:[id number] DOB: 1946/09/28 DOA: 08/04/2022 PCP: Perrin Maltese, MD   Brief Narrative:  76 year old with history of DM2, CKD stage II, anemia of chronic disease and iron deficiency, esophageal stricture, nephrolithiasis, upper GI bleed admitted for weakness and dark stool.  Admission hemoglobin 7.2, baseline 10.9.  CT of the abdomen pelvis showed nonobstructive Renal stone, enlarged pancreatic duct.  Patient was started on PPI, received 1 unit PRBC transfusion and GI was notified.   Assessment & Plan:  Principal Problem:   AKI (acute kidney injury) ([place]) Active Problems:   GI bleeding   Hypertension   Type 2 diabetes mellitus with hyperglycemia, without long-term current use of insulin (HCC)   Anemia of chronic disease     Assessment and Plan: * AKI (acute kidney injury) ([place]) on CKD 3 A Patient's baseline creatinine 1.79 in March thousand 23.  Admission creatinine 7.10 > 6.39.   Getting IV fluids.  Follows with outpatient Dr. Zollie Scale.  Concerns of dehydration, getting IV fluids.  If creatinine starts trending up, will require inpatient nephrology consult.  Bicarb and potassium okay.  GI bleeding, suspect upper GI bleed Patient in January 2023 with possible UGI bleed. She has h/o Esophageal stricture and abnormal swallow. Now with h/o melanic stools and heme positive stool on exam by EDP.  Currently on IV PPI twice daily, GI consulted by admitting provider  Anemia of chronic disease History of CKD, iron deficiency and blood loss.  Baseline hemoglobin 10.5, admission hemoglobin 7.2.  Status post 1 unit PRBC transfusion.  We will give another unit, GI consulted.   Type 2 diabetes mellitus with hyperglycemia, without long-term current use of insulin (HCC) -A1c 6.5.  Due to hypoglycemic episode, will stop all long-acting and home p.o. insulin.  We will continue sliding scale and Accu-Cheks.  Nonobstructive renal stone - Hydration.  Dilated  pancreatic duct - Continue to monitor, will benefit from MRCP but will defer this to GI.  Does not seem to be urgent at this time.  Right renal lesion, 1.2 cm - Outpatient MRI.  Hypertension Norvasc.  IV as needed.   DVT prophylaxis: Place TED hose Start: 08/04/22 2148 Code Status: Full code Family Communication:    Status is: Inpatient Maintain hospital stay due to multiple ongoing issues including AKI, GI bleed    Subjective: Seen and examined at bedside.  Resting comfortably no complaints   Examination:  General exam: Appears calm and comfortable  Respiratory system: Clear to auscultation. Respiratory effort normal. Cardiovascular system: S1 & S2 heard, RRR. No JVD, murmurs, rubs, gallops or clicks. No pedal edema. Gastrointestinal system: Abdomen is nondistended, soft and nontender. No organomegaly or masses felt. Normal bowel sounds heard. Central nervous system: Alert and oriented. No focal neurological deficits. Extremities: Symmetric 5 x 5 power. Skin: No rashes, lesions or ulcers Psychiatry: Judgement and insight appear normal. Mood & affect appropriate.     Objective: Vitals:   08/05/22 0200 08/05/22 0230 08/05/22 0455 08/05/22 0500  BP: (!) 127/54 (!) 123/53  (!) 151/61  Pulse: 72 71  78  Resp: '18 16  16  '$ Temp:   98.3 F (36.8 C)   TempSrc:   Oral   SpO2: 98% 98%  98%    Intake/Output Summary (Last 24 hours) at 08/05/2022 0822 Last data filed at 08/05/2022 0456 Gross per 24 hour  Intake 1307.33 ml  Output 300 ml  Net 1007.33 ml   There were no vitals filed for this  visit.   Data Reviewed:   CBC: Recent Labs  Lab 08/04/22 1303 08/05/22 0428  WBC 6.0  --   HGB 7.2* 7.4*  HCT 22.3* 22.0*  MCV 90.3  --   PLT 227  --    Basic Metabolic Panel: Recent Labs  Lab 08/04/22 1303 08/05/22 0428  NA 138 142  K 4.5 3.9  CL 107 113*  CO2 21* 21*  GLUCOSE 234* 45*  BUN 102* 94*  CREATININE 7.10* 6.39*  CALCIUM 8.9 8.3*   GFR: CrCl cannot  be calculated (Unknown ideal weight.). Liver Function Tests: Recent Labs  Lab 08/04/22 1303  AST 46*  ALT 50*  ALKPHOS 74  BILITOT 0.9  PROT 7.1  ALBUMIN 3.5   No results for input(s): "LIPASE", "AMYLASE" in the last 168 hours. No results for input(s): "AMMONIA" in the last 168 hours. Coagulation Profile: No results for input(s): "INR", "PROTIME" in the last 168 hours. Cardiac Enzymes: No results for input(s): "CKTOTAL", "CKMB", "CKMBINDEX", "TROPONINI" in the last 168 hours. BNP (last 3 results) No results for input(s): "PROBNP" in the last 8760 hours. HbA1C: Recent Labs    08/04/22 1332  HGBA1C 6.5*   CBG: Recent Labs  Lab 08/04/22 2329 08/05/22 0421 08/05/22 0448 08/05/22 0558  GLUCAP 184* 39* 174* 150*   Lipid Profile: No results for input(s): "CHOL", "HDL", "LDLCALC", "TRIG", "CHOLHDL", "LDLDIRECT" in the last 72 hours. Thyroid Function Tests: No results for input(s): "TSH", "T4TOTAL", "FREET4", "T3FREE", "THYROIDAB" in the last 72 hours. Anemia Panel: No results for input(s): "VITAMINB12", "FOLATE", "FERRITIN", "TIBC", "IRON", "RETICCTPCT" in the last 72 hours. Sepsis Labs: No results for input(s): "PROCALCITON", "LATICACIDVEN" in the last 168 hours.  No results found for this or any previous visit (from the past 240 hour(s)).       Radiology Studies: CT Renal Stone Study  Result Date: 08/04/2022 CLINICAL DATA:  Nephrolithiasis CR+ and hx stone EXAM: CT ABDOMEN AND PELVIS WITHOUT CONTRAST TECHNIQUE: Multidetector CT imaging of the abdomen and pelvis was performed following the standard protocol without IV contrast. RADIATION DOSE REDUCTION: This exam was performed according to the departmental dose-optimization program which includes automated exposure control, adjustment of the mA and/or kV according to patient size and/or use of iterative reconstruction technique. COMPARISON:  None Available. FINDINGS: Lower chest: Severe atherosclerotic plaque. Coronary  artery calcification. No acute abnormality. Hepatobiliary: No focal liver abnormality. No gallstones, gallbladder wall thickening, or pericholecystic fluid. No biliary dilatation. Pancreas: No focal lesion. Normal pancreatic contour. No surrounding inflammatory changes. Nonspecific prominent proximal main pancreatic measuring up to 6 mm. No main pancreatic ductal dilatation. Spleen: Normal in size without focal abnormality. Adrenals/Urinary Tract: No adrenal nodule bilaterally. Bilateral amorphous calcifications that may represent calcified stones. No hydronephrosis. Hyperdense 1.2 cm right renal lesion with a density of 58 Hounsfield units. No ureterolithiasis or hydroureter. The urinary bladder is unremarkable. Stomach/Bowel: Stomach is within normal limits. No evidence of bowel wall thickening or dilatation. Appendiceal stump with appendicolith noted within its lumen distally. No inflammatory changes along the right lower quadrant to suggest appendicitis. Vascular/Lymphatic: No abdominal aorta or iliac aneurysm. Severe atherosclerotic plaque of the aorta and its branches. No abdominal, pelvic, or inguinal lymphadenopathy. Reproductive: Status post hysterectomy. No adnexal masses. Other: No intraperitoneal free fluid. No intraperitoneal free gas. No organized fluid collection. Musculoskeletal: No abdominal wall hernia or abnormality. No suspicious lytic or blastic osseous lesions. No acute displaced fracture. L5-S1 intervertebral disc space vacuum phenomenon. IMPRESSION: 1. Bilateral amorphous calcifications that may represent nonobstructive calcified  stones. 2. Nonspecific prominent proximal main pancreatic measuring up to 6 mm. No associated biliary ductal dilatation. Question IPMN. When the patient is clinically stable and able to follow directions and hold their breath (preferably as an outpatient) further evaluation with dedicated nonemergent MRI pancreatic protocol should be considered. 3. Indeterminate 1.2  cm right renal lesion. Finding can be further evaluated on the MRI. 4.  Aortic Atherosclerosis (ICD10-I70.0). Electronically Signed   By: Iven Finn M.D.   On: 08/04/2022 18:23        Scheduled Meds:  amLODipine  5 mg Oral Daily   ferrous sulfate  325 mg Oral BID WC   insulin aspart  0-15 Units Subcutaneous Q4H   insulin detemir  10 Units Subcutaneous QHS   pantoprazole (PROTONIX) IV  40 mg Intravenous Q12H   rosuvastatin  40 mg Oral Daily   senna  1 tablet Oral BID   Continuous Infusions:  dextrose 5 % and 0.9% NaCl 100 mL/hr at 08/05/22 0503     LOS: 1 day   Time spent= 35 mins    Bianka Liberati Arsenio Loader, MD [facility]  If 7PM-7AM, please contact night-coverage  08/05/2022, 8:22 AM

## 2022-08-05 NOTE — Inpatient Diabetes Management (Addendum)
Inpatient Diabetes Program Recommendations  AACE/ADA: New Consensus Statement on Inpatient Glycemic Control (2015)  Target Ranges:  Prepandial:   less than 140 mg/dL      Peak postprandial:   less than 180 mg/dL (1-2 hours)      Critically ill patients:  140 - 180 mg/dL    Latest Reference Range & Units 08/04/22 13:32  Hemoglobin A1C 4.8 - 5.6 % 6.5 (H)  (H): Data is abnormally high  Latest Reference Range & Units 08/04/22 23:29 08/05/22 04:21 08/05/22 04:48 08/05/22 05:58  Glucose-Capillary 70 - 99 mg/dL 184 (H)  3 units Novolog  10 units Levemir 39 (LL) 174 (H) 150 (H)    Admit with:  AKI (acute kidney injury) GI bleeding Anemia of chronic disease  History: DM, CKD  Home DM Meds: Glipizide 10 mg BID       Januvia 100 mg Daily       Metformin 1000 mg BID  Current Orders: Levemir 10 units QHS     Novolog Moderate Correction Scale/ SSI (0-15 units) Q4 hours     MD- Note pt told admitting MD she had Hypoglycemia at home last week and PCP office stopped her Glipizide and Metformin was reduced to 500 mg BID (was 1000 mg BID)  If renal function does not improve significantly, may need to Stop Metformin at home.  Perhaps pt would do better on lower dose of Glipizide at home?  Was getting 10 mg BID--Likely too much given her renal function is poor.  Note pt given Levemir 10 units at 11:30pm last night--Severe Hypoglycemia by 4am  Please consider:  1. Stop Levemir (or could try reducing dose to 5 units QHS)  2. Reduce Novolog SSI to the Sensitive 0-9 unit scale Q4 hours   Addendum 1:25pm--Met w/ pt at bedside to discuss Hypoglycemia, renal function, plan for CBG checks.  Pt told me her PCP, Dr. Chancy Milroy stopped her Glipizide last Fri (10/13) and reduced her Metformin to 1000 mg daily (was 2000 mg Daily).  Per pt, pt was having some issues with HYPO at home.  Discussed with pt that her renal function has declined at present, and that she will not clear her oral diabetes meds or  insulin as well with worsening kidney function.  I relayed to pt that typically we do not start oral DM meds immediately upon admission and that the MD ordered both long-acting and rapid-acting insulins.  I discussed with pt that I suspect the long-acting insulin may have caused her HYPO events today and that the MD has stopped the long-acting insulin and reduced the rapid-acting insulin.  Discussed with pt that we will check her CBGs frequently here in the hospital and encouraged pt to call the RN station if she has any symptoms of HYPO.  Pt told me he rarely has HYPO symptoms at home--sometimes feels weak but often no symptoms.  Treats lows with juice at home.  We reviewed appropriate HYPO treatment at home.  Discussed with pt that the hospital MD may need to further adjust her DM meds prior to d/c home.  Pt appreciative of info and did not have any questions for me at this time.      --Will follow patient during hospitalization--  Wyn Quaker RN, MSN, [place] Diabetes Coordinator Inpatient Glycemic Control Team Team Pager: [phone] (8a-5p)

## 2022-08-05 NOTE — ED Notes (Signed)
CBG 39, amp of dextrose given

## 2022-08-05 NOTE — Transfer of Care (Signed)
Immediate Anesthesia Transfer of Care Note  Patient: Patricia Cross  Procedure(s) Performed: ESOPHAGOGASTRODUODENOSCOPY (EGD) WITH PROPOFOL  Patient Location: Endoscopy Unit  Anesthesia Type:General  Level of Consciousness: drowsy  Airway & Oxygen Therapy: Patient Spontanous Breathing  Post-op Assessment: Report given to RN and Post -op Vital signs reviewed and stable  Post vital signs: Reviewed and stable  Last Vitals:  Vitals Value Taken Time  BP 108/48 08/05/22 1543  Temp    Pulse 79 08/05/22 1543  Resp 18 08/05/22 1543  SpO2 100 % 08/05/22 1543    Last Pain:  Vitals:   08/05/22 1543  TempSrc:   PainSc: Asleep         Complications: No notable events documented.

## 2022-08-06 ENCOUNTER — Encounter: Payer: Self-pay | Admitting: Gastroenterology

## 2022-08-06 DIAGNOSIS — N179 Acute kidney failure, unspecified: Secondary | ICD-10-CM | POA: Diagnosis not present

## 2022-08-06 LAB — TYPE AND SCREEN
ABO/RH(D): O POS
Antibody Screen: NEGATIVE
Unit division: 0
Unit division: 0

## 2022-08-06 LAB — BASIC METABOLIC PANEL
Anion gap: 7 (ref 5–15)
BUN: 72 mg/dL — ABNORMAL HIGH (ref 8–23)
CO2: 19 mmol/L — ABNORMAL LOW (ref 22–32)
Calcium: 8.4 mg/dL — ABNORMAL LOW (ref 8.9–10.3)
Chloride: 113 mmol/L — ABNORMAL HIGH (ref 98–111)
Creatinine, Ser: 5.51 mg/dL — ABNORMAL HIGH (ref 0.44–1.00)
GFR, Estimated: 7 mL/min — ABNORMAL LOW (ref >60)
Glucose, Bld: 200 mg/dL — ABNORMAL HIGH (ref 70–99)
Potassium: 3.6 mmol/L (ref 3.5–5.1)
Sodium: 139 mmol/L (ref 135–145)

## 2022-08-06 LAB — BPAM RBC
Blood Product Expiration Date: 202311202359
Blood Product Expiration Date: 202311232359
ISSUE DATE / TIME: 202310192008
ISSUE DATE / TIME: 202310200913
Unit Type and Rh: 5100
Unit Type and Rh: 5100

## 2022-08-06 LAB — CBC
HCT: 26.4 % — ABNORMAL LOW (ref 36.0–46.0)
Hemoglobin: 9 g/dL — ABNORMAL LOW (ref 12.0–15.0)
MCH: 29.4 pg (ref 26.0–34.0)
MCHC: 34.1 g/dL (ref 30.0–36.0)
MCV: 86.3 fL (ref 80.0–100.0)
Platelets: 163 10*3/uL (ref 150–400)
RBC: 3.06 MIL/uL — ABNORMAL LOW (ref 3.87–5.11)
RDW: 15.1 % (ref 11.5–15.5)
WBC: 5.6 10*3/uL (ref 4.0–10.5)
nRBC: 0 % (ref 0.0–0.2)

## 2022-08-06 LAB — MAGNESIUM: Magnesium: 1.5 mg/dL — ABNORMAL LOW (ref 1.7–2.4)

## 2022-08-06 LAB — URINE CULTURE: Culture: <10000 — AB

## 2022-08-06 LAB — GLUCOSE, CAPILLARY
Glucose-Capillary: 231 mg/dL — ABNORMAL HIGH (ref 70–99)
Glucose-Capillary: 255 mg/dL — ABNORMAL HIGH (ref 70–99)
Glucose-Capillary: 241 mg/dL — ABNORMAL HIGH (ref 70–99)
Glucose-Capillary: 304 mg/dL — ABNORMAL HIGH (ref 70–99)

## 2022-08-06 MED ORDER — PANTOPRAZOLE SODIUM 40 MG PO TBEC
40.0000 mg | DELAYED_RELEASE_TABLET | Freq: Every day | ORAL | Status: DC
  Administered 2022-08-07 – 2022-08-10 (×4): 40 mg via ORAL
  Filled 2022-08-06 (×4): qty 1

## 2022-08-06 MED ORDER — MAGNESIUM SULFATE 2 GM/50ML IV SOLN
2.0000 g | Freq: Once | INTRAVENOUS | Status: AC
  Administered 2022-08-06: 2 g via INTRAVENOUS
  Filled 2022-08-06: qty 50

## 2022-08-06 MED ORDER — POTASSIUM CHLORIDE 20 MEQ PO PACK
40.0000 meq | PACK | Freq: Once | ORAL | Status: AC
  Administered 2022-08-06: 40 meq via ORAL
  Filled 2022-08-06: qty 2

## 2022-08-06 MED ORDER — SODIUM CHLORIDE 0.9 % IV SOLN: INTRAVENOUS | Status: DC

## 2022-08-06 NOTE — Progress Notes (Signed)
PROGRESS NOTE    Patricia Cross  NKN:[id number] DOB: 1946/02/08 DOA: 08/04/2022 PCP: Perrin Maltese, MD   Brief Narrative:  76 year old with history of DM2, CKD stage II, anemia of chronic disease and iron deficiency, esophageal stricture, nephrolithiasis, upper GI bleed admitted for weakness and dark stool.  Admission hemoglobin 7.2, baseline 10.9.  Also found to have acute kidney injury with creatinine of 7.1.  CT of the abdomen pelvis showed nonobstructive Renal stone, enlarged pancreatic duct.  Patient was started on PPI, received 1 unit PRBC transfusion and GI was notified.  Underwent endoscopy on 10/22 which showed medium size hiatal hernia, benign-appearing esophageal stenosis and duodenal polyp.  Focus was not dilated due to risk of bleeding in the setting of anemia.  Renal function slowly improving with IV fluids   Assessment & Plan:  Principal Problem:   AKI (acute kidney injury) ([place]) Active Problems:   GI bleeding   Hypertension   Type 2 diabetes mellitus with hyperglycemia, without long-term current use of insulin (HCC)   Anemia of chronic disease     Assessment and Plan: * AKI (acute kidney injury) ([place]) on CKD 3 A Patient's baseline creatinine 1.79 in March thousand 23.  Admission creatinine 7.10 > 6.39.   Getting IV fluids.  Follows with outpatient Dr. Zollie Scale.  With IV fluids creatinine is down to 5.5 today.  Hypomagnesemia/hypokalemia - As needed repletion  GI bleeding, suspect upper GI bleed Patient in January 2023 with possible UGI bleed. She has h/o Esophageal stricture and abnormal swallow.  Melanotic stools have resolved.  Underwent EGD 10/20 which showed medium size hiatal hernia, benign-appearing esophageal stenosis and duodenal polyp.  Dilation was not performed due to further risk of bleeding in the setting of anemia.  For now plan is to get outpatient colonoscopy with her primary GI unless if she develops any significant bleeding during the  hospitalization.  Anemia of chronic disease History of CKD, iron deficiency and blood loss.  Baseline hemoglobin 10.5, admission hemoglobin 7.2.  After receiving 2 units of PRBC transfusion her hemoglobin is stable, today morning is 9.0.   Type 2 diabetes mellitus with hyperglycemia, without long-term current use of insulin (HCC) -A1c 6.5.  Due to hypoglycemic episode especially in the setting of AKI, will stop all long-acting and home p.o. insulin.  We will continue sliding scale and Accu-Cheks.    Nonobstructive renal stone - Hydration.  Dilated pancreatic duct - Continue to monitor, will benefit from MRCP but will defer this to GI.  Does not seem to be urgent at this time.  Right renal lesion, 1.2 cm - Outpatient MRI.  Hypertension Norvasc.  IV as needed.   DVT prophylaxis: Place TED hose Start: 08/04/22 2148 Code Status: Full code Family Communication:  Called son  Status is: Inpatient Ongoing management for acute kidney injury in the meantime monitoring for any further signs of bleeding  Subjective: Seen and examined at bedside.  No new complaints this morning.  Examination: Constitutional: Not in acute distress Respiratory: Clear to auscultation bilaterally Cardiovascular: Normal sinus rhythm, no rubs Abdomen: Nontender nondistended good bowel sounds Musculoskeletal: No edema noted Skin: No rashes seen Neurologic: CN 2-12 grossly intact.  And nonfocal Psychiatric: Normal judgment and insight. Alert and oriented x 3. Normal mood.  Objective: Vitals:   08/05/22 1651 08/05/22 2147 08/06/22 0456 08/06/22 0814  BP: (!) 160/70 138/60 (!) 142/65 136/62  Pulse: 88 86 80 76  Resp: '17 16 16 16  '$ Temp: 98 F (36.7 C) 99  F (37.2 C) 99.3 F (37.4 C) 99 F (37.2 C)  TempSrc:      SpO2: 100% 98% 97% 97%  Weight:      Height:        Intake/Output Summary (Last 24 hours) at 08/06/2022 0911 Last data filed at 08/05/2022 1539 Gross per 24 hour  Intake 652.05 ml   Output --  Net 652.05 ml   Filed Weights   08/05/22 0933 08/05/22 1240  Weight: 47.6 kg 48.1 kg     Data Reviewed:   CBC: Recent Labs  Lab 08/04/22 1303 08/05/22 0428 08/05/22 1751 08/06/22 0439  WBC 6.0  --   --  5.6  HGB 7.2* 7.4* 10.3* 9.0*  HCT 22.3* 22.0* 30.7* 26.4*  MCV 90.3  --   --  86.3  PLT 227  --   --  859   Basic Metabolic Panel: Recent Labs  Lab 08/04/22 1303 08/05/22 0428 08/06/22 0439  NA 138 142 139  K 4.5 3.9 3.6  CL 107 113* 113*  CO2 21* 21* 19*  GLUCOSE 234* 45* 200*  BUN 102* 94* 72*  CREATININE 7.10* 6.39* 5.51*  CALCIUM 8.9 8.3* 8.4*  MG  --   --  1.5*   GFR: Estimated Creatinine Clearance: 6.4 mL/min (A) (by C-G formula based on SCr of 5.51 mg/dL (H)). Liver Function Tests: Recent Labs  Lab 08/04/22 1303  AST 46*  ALT 50*  ALKPHOS 74  BILITOT 0.9  PROT 7.1  ALBUMIN 3.5   No results for input(s): "LIPASE", "AMYLASE" in the last 168 hours. No results for input(s): "AMMONIA" in the last 168 hours. Coagulation Profile: No results for input(s): "INR", "PROTIME" in the last 168 hours. Cardiac Enzymes: No results for input(s): "CKTOTAL", "CKMB", "CKMBINDEX", "TROPONINI" in the last 168 hours. BNP (last 3 results) No results for input(s): "PROBNP" in the last 8760 hours. HbA1C: Recent Labs    08/04/22 1332  HGBA1C 6.5*   CBG: Recent Labs  Lab 08/05/22 1245 08/05/22 1332 08/05/22 1649 08/05/22 2148 08/06/22 0816  GLUCAP 45* 129* 114* 190* 231*   Lipid Profile: No results for input(s): "CHOL", "HDL", "LDLCALC", "TRIG", "CHOLHDL", "LDLDIRECT" in the last 72 hours. Thyroid Function Tests: No results for input(s): "TSH", "T4TOTAL", "FREET4", "T3FREE", "THYROIDAB" in the last 72 hours. Anemia Panel: No results for input(s): "VITAMINB12", "FOLATE", "FERRITIN", "TIBC", "IRON", "RETICCTPCT" in the last 72 hours. Sepsis Labs: No results for input(s): "PROCALCITON", "LATICACIDVEN" in the last 168 hours.  Recent Results  (from the past 240 hour(s))  Urine Culture     Status: Abnormal   Collection Time: 08/05/22 12:05 AM   Specimen: Urine, Clean Catch  Result Value Ref Range Status   Specimen Description   Final    URINE, CLEAN CATCH Performed at [facility], [address], [place], [place] [address]    Special Requests   Final    NONE Performed at [facility], [address] [place], [place] [address]    Culture (A)  Final    <10,000 COLONIES/mL INSIGNIFICANT GROWTH Performed at [facility] [address], [place],  [address]    Report Status 08/06/2022 FINAL  Final         Radiology Studies: CT Renal Stone Study  Result Date: 08/04/2022 CLINICAL DATA:  Nephrolithiasis CR+ and hx stone EXAM: CT ABDOMEN AND PELVIS WITHOUT CONTRAST TECHNIQUE: Multidetector CT imaging of the abdomen and pelvis was performed following the standard protocol without IV contrast. RADIATION DOSE REDUCTION: This  exam was performed according to the departmental dose-optimization program which includes automated exposure control, adjustment of the mA and/or kV according to patient size and/or use of iterative reconstruction technique. COMPARISON:  None Available. FINDINGS: Lower chest: Severe atherosclerotic plaque. Coronary artery calcification. No acute abnormality. Hepatobiliary: No focal liver abnormality. No gallstones, gallbladder wall thickening, or pericholecystic fluid. No biliary dilatation. Pancreas: No focal lesion. Normal pancreatic contour. No surrounding inflammatory changes. Nonspecific prominent proximal main pancreatic measuring up to 6 mm. No main pancreatic ductal dilatation. Spleen: Normal in size without focal abnormality. Adrenals/Urinary Tract: No adrenal nodule bilaterally. Bilateral amorphous calcifications that may represent calcified stones. No hydronephrosis. Hyperdense 1.2 cm right renal lesion with a density of 58 Hounsfield units. No ureterolithiasis or  hydroureter. The urinary bladder is unremarkable. Stomach/Bowel: Stomach is within normal limits. No evidence of bowel wall thickening or dilatation. Appendiceal stump with appendicolith noted within its lumen distally. No inflammatory changes along the right lower quadrant to suggest appendicitis. Vascular/Lymphatic: No abdominal aorta or iliac aneurysm. Severe atherosclerotic plaque of the aorta and its branches. No abdominal, pelvic, or inguinal lymphadenopathy. Reproductive: Status post hysterectomy. No adnexal masses. Other: No intraperitoneal free fluid. No intraperitoneal free gas. No organized fluid collection. Musculoskeletal: No abdominal wall hernia or abnormality. No suspicious lytic or blastic osseous lesions. No acute displaced fracture. L5-S1 intervertebral disc space vacuum phenomenon. IMPRESSION: 1. Bilateral amorphous calcifications that may represent nonobstructive calcified stones. 2. Nonspecific prominent proximal main pancreatic measuring up to 6 mm. No associated biliary ductal dilatation. Question IPMN. When the patient is clinically stable and able to follow directions and hold their breath (preferably as an outpatient) further evaluation with dedicated nonemergent MRI pancreatic protocol should be considered. 3. Indeterminate 1.2 cm right renal lesion. Finding can be further evaluated on the MRI. 4.  Aortic Atherosclerosis (ICD10-I70.0). Electronically Signed   By: Iven Finn M.D.   On: 08/04/2022 18:23        Scheduled Meds:  amLODipine  5 mg Oral Daily   ferrous sulfate  325 mg Oral BID WC   glucagon (human recombinant)  1 mg Intravenous Once   insulin aspart  0-5 Units Subcutaneous QHS   insulin aspart  0-6 Units Subcutaneous TID WC   pantoprazole (PROTONIX) IV  40 mg Intravenous Q12H   rosuvastatin  40 mg Oral Daily   senna  1 tablet Oral BID   Continuous Infusions:  dextrose 5 % and 0.45% NaCl 75 mL/hr at 08/05/22 1309     LOS: 2 days   Time spent= 35  mins    Patricia Machnik Arsenio Loader, MD [facility]  If 7PM-7AM, please contact night-coverage  08/06/2022, 9:11 AM

## 2022-08-06 NOTE — Plan of Care (Signed)

## 2022-08-06 NOTE — Evaluation (Signed)
Physical Therapy Evaluation Patient Details Name: Patricia Cross MRN: [id number] DOB: Jun 30, 1946 Today's Date: 08/06/2022  History of Present Illness  pt is a 76 year old female admitted with weakness, dark stool, AKI, Underwent endoscopy on 10/22 which showed medium size hiatal hernia, benign-appearing esophageal stenosis and duodenal polyp.  Focus was not dilated due to risk of bleeding in the setting of anemia.  Renal function slowly improving with IV fluids; PMH significant for     DM2, CKD stage II, anemia of chronic disease and iron deficiency, esophageal stricture, nephrolithiasis, upper GI bleed   Clinical Impression  Pt is a 76 year old F admitted to hospital on 08/04/22 for AKI. At baseline, pt was Ind with ADL's, IADL's, ambulation without AD, and driving.   Pt presents with hip weakness, decreased standing balance, and decreased activity tolerance, resulting in impaired functional mobility. Due to deficits, pt required supervision for bed mobility, CGA for transfers without AD, and CGA-supervision to ambulate a total of 121f. Pt able to demonstrate improvements in overall quality of gait, balance, endurance, and assist levels with use of RW vs. No AD. Deficits limit the pt's ability to safely and independently perform ADL's, transfer, and ambulate. Pt will benefit from acute skilled PT services to address deficits for return to baseline function.   At this time, PT recommends HHPT/OT to address deficits and improve overall safety with functional mobility at DC. Pt will also benefit from RW and 3in1 for energy conservation and safety with functional mobility/ADL's at DC. Pt and family agreeable for DC/DME recommendations. Family interested in obtaining Handicap sticker for pt's car; PT believes this would be appropriate as it will assist with safety with increased community ambulation distances. Educated pt/family that they will need to reach out to PCP for this.         Recommendations for follow up therapy are one component of a multi-disciplinary discharge planning process, led by the attending physician.  Recommendations may be updated based on patient status, additional functional criteria and insurance authorization.  Follow Up Recommendations Home health PT      Assistance Recommended at Discharge PRN     Equipment Recommendations Rolling walker (2 wheels);BSC/3in1     Functional Status Assessment Patient has had a recent decline in their functional status and demonstrates the ability to make significant improvements in function in a reasonable and predictable amount of time.     Precautions / Restrictions Precautions Precautions: Fall Restrictions Weight Bearing Restrictions: No      Mobility  Bed Mobility Overal bed mobility: Needs Assistance Bed Mobility: Supine to Sit, Sit to Supine     Supine to sit: Supervision Sit to supine: Supervision        Transfers Overall transfer level: Needs assistance Equipment used: None Transfers: Sit to/from Stand Sit to Stand: Min guard           General transfer comment: STS from EOB without AD, use of BUE for support    Ambulation/Gait Ambulation/Gait assistance: Min guard, Supervision Gait Distance (Feet): 180 Feet (263fx1 without AD, 16098f1 with RW)           General Gait Details: CGA for safety to ambulate short distance without AD, use of BUE for intermittent support on walls/furniture for steadying; narrow BOS with decreased step length/foot clearance bil. Improved to supervision to ambulate with RW; improved BOS and step length/foot clearance bil.     Balance Overall balance assessment: Needs assistance Sitting-balance support: Feet supported Sitting balance-Leahy  Scale: Good     Standing balance support: Single extremity supported Standing balance-Leahy Scale: Fair Standing balance comment: fair standing balance without AD, improving to good with RW                              Pertinent Vitals/Pain Pain Assessment Pain Assessment: No/denies pain    Home Living Family/patient expects to be discharged to:: Private residence Living Arrangements: Alone Available Help at Discharge: Friend(s);Family;Available PRN/intermittently Type of Home: House Home Access: Stairs to enter   Entrance Stairs-Number of Steps: 4 Alternate Level Stairs-Number of Steps: flight- Furniture conservator/restorer is on the first floor Home Layout: Multi-level;Full bath on main level Home Equipment: None      Prior Function Prior Level of Function : Independent/Modified Independent             Mobility Comments: amb no AD ADLs Comments: indep in ADL/IADL, drives a school bus     Hand Dominance   Dominant Hand: Right    Extremity/Trunk Assessment   Upper Extremity Assessment Upper Extremity Assessment: Defer to OT evaluation    Lower Extremity Assessment Lower Extremity Assessment: Overall WFL for tasks assessed (hip flexion 3+/5, otherwise grossly 4+/5; sensation intact)    Cervical / Trunk Assessment Cervical / Trunk Assessment: Normal  Communication   Communication: No difficulties  Cognition Arousal/Alertness: Awake/alert Behavior During Therapy: WFL for tasks assessed/performed Overall Cognitive Status: Within Functional Limits for tasks assessed                                 General Comments: able to follow 100% of 3-step commands        General Comments General comments (skin integrity, edema, etc.): RPE of 2-3/10 indicating "light activity" with ambulation in RW    Exercises Other Exercises Other Exercises: Participates in bed mobility, transfers, and gait with and without AD. Improved quality of gait, assist levels, and balance with use of RW. Other Exercises: Pt and family educated re: PT role/POC, DC recommendations, benefits of AD for safety/energy conservation.   Assessment/Plan    PT Assessment Patient needs  continued PT services  PT Problem List Decreased strength;Decreased activity tolerance;Decreased balance;Decreased mobility       PT Treatment Interventions DME instruction;Gait training;Stair training;Functional mobility training;Therapeutic activities;Therapeutic exercise;Balance training;Neuromuscular re-education    PT Goals (Current goals can be found in the Care Plan section)  Acute Rehab PT Goals Patient Stated Goal: "go home" PT Goal Formulation: With patient Time For Goal Achievement: 08/20/22 Potential to Achieve Goals: Good    Frequency Min 2X/week        AM-PAC PT "6 Clicks" Mobility  Outcome Measure Help needed turning from your back to your side while in a flat bed without using bedrails?: A Little Help needed moving from lying on your back to sitting on the side of a flat bed without using bedrails?: A Little Help needed moving to and from a bed to a chair (including a wheelchair)?: A Little Help needed standing up from a chair using your arms (e.g., wheelchair or bedside chair)?: A Little Help needed to walk in hospital room?: A Little Help needed climbing 3-5 steps with a railing? : A Little 6 Click Score: 18    End of Session Equipment Utilized During Treatment: Gait belt Activity Tolerance: Patient tolerated treatment well Patient left: in bed;with nursing/sitter in room;with family/visitor present;with call  bell/phone within reach Nurse Communication: Mobility status PT Visit Diagnosis: Unsteadiness on feet (R26.81);Muscle weakness (generalized) (M62.81)    Time: 2003-7944 PT Time Calculation (min) (ACUTE ONLY): 13 min   Charges:   PT Evaluation $PT Eval Low Complexity: 1 Low         Herminio Commons, PT, DPT 3:15 PM,08/06/22 Physical Therapist - [facility]

## 2022-08-06 NOTE — Evaluation (Signed)
Occupational Therapy Evaluation Patient Details Name: Patricia Cross MRN: [id number] DOB: 07-Jun-1946 Today's Date: 08/06/2022   History of Present Illness pt is a 76 year old female admitted with weakness, dark stool, AKI, Underwent endoscopy on 10/22 which showed medium size hiatal hernia, benign-appearing esophageal stenosis and duodenal polyp.  Focus was not dilated due to risk of bleeding in the setting of anemia.  Renal function slowly improving with IV fluids; PMH significant for     DM2, CKD stage II, anemia of chronic disease and iron deficiency, esophageal stricture, nephrolithiasis, upper GI bleed   Clinical Impression   Chart reviewed, RN cleared pt for participation in OT evaluation. Pt is alert and oriented x4, agreeable to OT evaluation. PTA pt is MOD I-I in ADL/IADL, amb with no AD, drives a local school bus. Pt presents with deficits in endurance, activity tolerance, balance all affecting safe and optimal ADL completion. CGA required for toilet transfer, CGA-MIN A with hand held assist required for amb 200' in hallway with pt reaching outwardly for support. Pt provided education re: role of rehab, discharge recommendations, home safety, falls prevention. Recommend HHOT following discharge. OT will continue to follow acutely.      Recommendations for follow up therapy are one component of a multi-disciplinary discharge planning process, led by the attending physician.  Recommendations may be updated based on patient status, additional functional criteria and insurance authorization.   Follow Up Recommendations  Home health OT    Assistance Recommended at Discharge Intermittent Supervision/Assistance  Patient can return home with the following A little help with walking and/or transfers;A little help with bathing/dressing/bathroom;Assistance with cooking/housework;Help with stairs or ramp for entrance    Functional Status Assessment  Patient has had a recent decline in their  functional status and demonstrates the ability to make significant improvements in function in a reasonable and predictable amount of time.  Equipment Recommendations  BSC/3in1    Recommendations for Other Services       Precautions / Restrictions Precautions Precautions: Fall Restrictions Weight Bearing Restrictions: No      Mobility Bed Mobility Overal bed mobility: Needs Assistance Bed Mobility: Supine to Sit, Sit to Supine     Supine to sit: Supervision Sit to supine: Supervision        Transfers Overall transfer level: Needs assistance Equipment used: None Transfers: Sit to/from Stand Sit to Stand: Min guard                  Balance Overall balance assessment: Needs assistance Sitting-balance support: Feet supported Sitting balance-Leahy Scale: Good     Standing balance support: Single extremity supported Standing balance-Leahy Scale: Fair                             ADL either performed or assessed with clinical judgement   ADL Overall ADL's : Needs assistance/impaired Eating/Feeding: Set up;Sitting   Grooming: Wash/dry hands;Standing;Supervision/safety Grooming Details (indicate cue type and reason): sink level         Upper Body Dressing : Set up;Sitting   Lower Body Dressing: Minimal assistance   Toilet Transfer: Ambulation;Min guard Toilet Transfer Details (indicate cue type and reason): no AD to toilet Toileting- Clothing Manipulation and Hygiene: Supervision/safety;Sit to/from stand       Functional mobility during ADLs: Min guard;Minimal assistance;Cueing for safety (200' in hallway with no AD, pt with LOB 2x requiring MIN A for recorrection) General ADL Comments: frequent vcs for safey, reaching  out for IV pole/furnature for support     Vision Patient Visual Report: No change from baseline       Perception     Praxis      Pertinent Vitals/Pain Pain Assessment Pain Assessment: No/denies pain     Hand  Dominance Right   Extremity/Trunk Assessment Upper Extremity Assessment Upper Extremity Assessment: Overall WFL for tasks assessed (tremor noted in B hands with purposeful movement)   Lower Extremity Assessment Lower Extremity Assessment: Defer to PT evaluation;Overall [facility] for tasks assessed   Cervical / Trunk Assessment Cervical / Trunk Assessment: Normal   Communication Communication Communication: No difficulties   Cognition Arousal/Alertness: Awake/alert Behavior During Therapy: WFL for tasks assessed/performed Overall Cognitive Status: Within Functional Limits for tasks assessed                                       General Comments  vital signs appear stable throughout    Exercises     Shoulder Instructions      Home Living Family/patient expects to be discharged to:: Private residence Living Arrangements: Alone Available Help at Discharge: Friend(s);Family;Available PRN/intermittently Type of Home: House Home Access: Stairs to enter Entrance Stairs-Number of Steps: 4   Home Layout: Multi-level;Full bath on main level Alternate Level Stairs-Number of Steps: flight- Furniture conservator/restorer is on the first floor   Bathroom Shower/Tub: Occupational psychologist: Standard Bathroom Accessibility: Yes How Accessible: Accessible via walker Home Equipment: None          Prior Functioning/Environment Prior Level of Function : Independent/Modified Independent             Mobility Comments: amb no AD ADLs Comments: indep in ADL/IADL, drives a school bus        OT Problem List: Decreased activity tolerance;Impaired balance (sitting and/or standing);Decreased knowledge of use of DME or AE      OT Treatment/Interventions: Self-care/ADL training;Patient/family education;Therapeutic exercise;DME and/or AE instruction;Therapeutic activities;Balance training    OT Goals(Current goals can be found in the care plan section) Acute Rehab OT  Goals Patient Stated Goal: return to PLOF OT Goal Formulation: With patient Time For Goal Achievement: 08/20/22 Potential to Achieve Goals: Good ADL Goals Pt Will Perform Grooming: with modified independence;standing Pt Will Perform Lower Body Dressing: with modified independence;sit to/from stand Pt Will Transfer to Toilet: with modified independence;ambulating Pt Will Perform Toileting - Clothing Manipulation and hygiene: with modified independence;sit to/from stand  OT Frequency: Min 2X/week    Co-evaluation              AM-PAC OT "6 Clicks" Daily Activity     Outcome Measure Help from another person eating meals?: None Help from another person taking care of personal grooming?: None Help from another person toileting, which includes using toliet, bedpan, or urinal?: None Help from another person bathing (including washing, rinsing, drying)?: A Little Help from another person to put on and taking off regular upper body clothing?: None Help from another person to put on and taking off regular lower body clothing?: A Little 6 Click Score: 22   End of Session Nurse Communication: Mobility status  Activity Tolerance: Patient tolerated treatment well Patient left: in bed;with call bell/phone within reach;with bed alarm set  OT Visit Diagnosis: Unsteadiness on feet (R26.81)                Time: 8527-7824 OT Time Calculation (min): 24  min Charges:  OT General Charges $OT Visit: 1 Visit OT Evaluation $OT Eval Moderate Complexity: 1 Mod Shanon Payor, OTD OTR/L  08/06/22, 2:49 PM

## 2022-08-06 NOTE — Progress Notes (Cosign Needed)
Patient is not able to walk the distance required to go the bathroom, or he/she is unable to safely negotiate stairs required to access the bathroom.  A 3in1 BSC will alleviate this problem Simmie Davies RN CM

## 2022-08-07 DIAGNOSIS — N179 Acute kidney failure, unspecified: Secondary | ICD-10-CM | POA: Diagnosis not present

## 2022-08-07 LAB — CBC
HCT: 28.4 % — ABNORMAL LOW (ref 36.0–46.0)
Hemoglobin: 9.7 g/dL — ABNORMAL LOW (ref 12.0–15.0)
MCH: 30.1 pg (ref 26.0–34.0)
MCHC: 34.2 g/dL (ref 30.0–36.0)
MCV: 88.2 fL (ref 80.0–100.0)
Platelets: 167 10*3/uL (ref 150–400)
RBC: 3.22 MIL/uL — ABNORMAL LOW (ref 3.87–5.11)
RDW: 14.9 % (ref 11.5–15.5)
WBC: 7.3 10*3/uL (ref 4.0–10.5)
nRBC: 0 % (ref 0.0–0.2)

## 2022-08-07 LAB — GLUCOSE, CAPILLARY
Glucose-Capillary: 234 mg/dL — ABNORMAL HIGH (ref 70–99)
Glucose-Capillary: 440 mg/dL — ABNORMAL HIGH (ref 70–99)
Glucose-Capillary: 371 mg/dL — ABNORMAL HIGH (ref 70–99)
Glucose-Capillary: 312 mg/dL — ABNORMAL HIGH (ref 70–99)

## 2022-08-07 LAB — BASIC METABOLIC PANEL
Anion gap: 7 (ref 5–15)
BUN: 66 mg/dL — ABNORMAL HIGH (ref 8–23)
CO2: 18 mmol/L — ABNORMAL LOW (ref 22–32)
Calcium: 8.7 mg/dL — ABNORMAL LOW (ref 8.9–10.3)
Chloride: 115 mmol/L — ABNORMAL HIGH (ref 98–111)
Creatinine, Ser: 4.93 mg/dL — ABNORMAL HIGH (ref 0.44–1.00)
GFR, Estimated: 9 mL/min — ABNORMAL LOW (ref >60)
Glucose, Bld: 215 mg/dL — ABNORMAL HIGH (ref 70–99)
Potassium: 4.4 mmol/L (ref 3.5–5.1)
Sodium: 140 mmol/L (ref 135–145)

## 2022-08-07 LAB — MAGNESIUM: Magnesium: 2.2 mg/dL (ref 1.7–2.4)

## 2022-08-07 NOTE — Progress Notes (Signed)
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  Patient underwent endoscopy 10/20 which showed benign-appearing esophageal stenosis and duodenal polyp.  Patient was transfused 1 unit PRBC.  Renal function is improving with transfusion and IV fluids.   Subjective:  Patient states she continues to have dark stools.  She states she was constipated and used a laxative and since then has been having dark stool, 3-4 stools since yesterday.  Denies diarrhea per se.   Objective: Vitals:   08/06/22 1708 08/06/22 2052 08/07/22 0526 08/07/22 0751  BP: (!) 131/54 (!) 148/61 (!) 150/61 (!) 154/74  Pulse: 72 68 71 69  Resp: '16 18 16 16  '$ Temp: 98.7 F (37.1 C) 97.8 F (36.6 C) 98.6 F (37 C) 98 F (36.7 C)  TempSrc:  Oral    SpO2: 97% 98% 99% 99%  Weight:      Height:        Intake/Output Summary (Last 24 hours) at 08/07/2022 1521 Last data filed at 08/06/2022 1536 Gross per 24 hour  Intake 94.85 ml  Output --  Net 94.85 ml   Filed Weights   08/05/22 0933 08/05/22 1240  Weight: 47.6 kg 48.1 kg     Exam:  General: Chronically ill-appearing female lying in bed in NAD Eyes: sclera anicteric, conjuctiva mild injection bilaterally CVS: S1-S2, regular  Respiratory:  decreased air entry bilaterally secondary to decreased inspiratory effort, rales at bases  GI: NABS, soft, NT  LE: No edema.  Neuro: A/O x 3, Moving all extremities equally with normal strength, CN 3-12 intact, grossly nonfocal.  Psych: patient is logical and coherent, judgement and insight appear normal, mood and affect appropriate to situation.   Assessment & Plan:   AKI with nonobstructive 1.2 cm renal stone Patient's kidney function continues to  improve, down to 4.9 today. Continue IV fluids, NS at 75 cc an hour and follow closely Nonobstructive renal stone can be managed as an outpatient with urology once AKI has resolved  Anemia/suspect upper GI bleed Patient transfused 1 unit PRBC. Underwent endoscopy 08/06/2023 which showed benign appearing esophageal stenosis and duodenal polyp.  Dilation was not performed due to risk of further bleeding. Plan is for outpatient colonoscopy per GI. Patient states she continues to have black stool but her H&H appears stable over the past 3 days since transfusion.  DM 2 Blood sugars are somewhat labile, ranging from 45-200 Continue present management  Dilated pancreatic duct Patient will need MRCP, timing to be scheduled per GI    Scheduled Meds:  amLODipine  5 mg Oral Daily   ferrous sulfate  325 mg Oral BID WC   glucagon (human recombinant)  1 mg Intravenous Once   insulin aspart  0-5 Units Subcutaneous QHS   insulin aspart  0-6 Units Subcutaneous TID WC   pantoprazole  40 mg Oral QAC breakfast   rosuvastatin  40 mg Oral Daily   senna  1 tablet Oral BID   Continuous Infusions:  sodium chloride 75 mL/hr at 08/06/22 1536    Data Reviewed:  Basic Metabolic Panel: Recent Labs  Lab 08/04/22 1303 08/05/22 0428 08/06/22 0439 08/07/22 0421  NA 138 142 139 140  K 4.5 3.9 3.6 4.4  CL 107  113* 113* 115*  CO2 21* 21* 19* 18*  GLUCOSE 234* 45* 200* 215*  BUN 102* 94* 72* 66*  CREATININE 7.10* 6.39* 5.51* 4.93*  CALCIUM 8.9 8.3* 8.4* 8.7*  MG  --   --  1.5* 2.2    CBC: Recent Labs  Lab 08/04/22 1303 08/05/22 0428 08/05/22 1751 08/06/22 0439 08/07/22 0421  WBC 6.0  --   --  5.6 7.3  HGB 7.2* 7.4* 10.3* 9.0* 9.7*  HCT 22.3* 22.0* 30.7* 26.4* 28.4*  MCV 90.3  --   --  86.3 88.2  PLT 227  --   --  163 167    Studies: No results found.  Principal Problem:   AKI (acute kidney injury) ([place]) Active Problems:   GI bleeding   Hypertension   Type 2 diabetes mellitus  with hyperglycemia, without long-term current use of insulin ([place])   Anemia of chronic disease     Flavia Bruss Tublu Larone Kliethermes, [facility]  If 7PM-7AM, please contact night-coverage [email or web address]   LOS: 3 days

## 2022-08-07 NOTE — TOC Initial Note (Signed)
Transition of Care [facility]) - Initial/Assessment Note    Patient Details  Name: Patricia Cross MRN: [id number] Date of Birth: [birth year]  [facility]) CM/SW Contact:    Patricia Price, RN Phone Number: 08/07/2022, 11:21 AM  Clinical Narrative:  10/22: Spoke with patient regarding TOC role, discharge plan, therapies recommended and why, medical equipment recommended and why. Patient is declining HH and any DME at this time. Discussed needs to get to appointments, drug store, grocery stores, which patient stated she feels she can do. Patient lives alone and did not have help or [place] prior to admission. No issues obtaining or paying for medications. Patient drove herself prior to admission. Confirmed PCP and RX as in chart.  Discussed everything she may need in terms of strength and energy to do on discharge and to possibly discuss with therapy again and think about some Patricia Cross services, which patient was agreeable to think about. TOC to follow till discharge in case patient changes her mind. Patricia Davies RN CM     Lives alone No issues getting meds,  Drove prior to admission Declining HH and DME at this time. TOC to follow.   PCP: Patricia Cross RX: [facility] [address] [name], [place] listed: Patricia Cross,Patricia Jr. (Son)  [phone] ([place])  Patricia Davies RN CM  Barriers to Discharge: Continued Medical Work up   Patient Goals and CMS Choice     Choice offered to / list presented to : Patient  Expected Discharge Plan and Services     Discharge Planning Services: CM Consult Post Acute Care Choice: Durable Medical Equipment Living arrangements for the past 2 months: Single Family Home                           HH Arranged: NA [place]: NA        Prior Living Arrangements/Services Living arrangements for the past 2 months: Single Family Home Lives with:: Self   Do you feel safe going back to the place where you live?: Yes      Need for  Family Participation in Patient Care: Yes (Comment) Care giver support system in place?: No (comment)   Criminal Activity/Legal Involvement Pertinent to Current Situation/Hospitalization: No - Comment as needed  Activities of Daily Living Home Assistive Devices/Equipment: CBG Meter ADL Screening (condition at time of admission) Patient's cognitive ability adequate to safely complete daily activities?: Yes Is the patient deaf or have difficulty hearing?: No Does the patient have difficulty seeing, even when wearing glasses/contacts?: No Does the patient have difficulty concentrating, remembering, or making decisions?: No Patient able to express need for assistance with ADLs?: Yes Does the patient have difficulty dressing or bathing?: No Independently performs ADLs?: Yes (appropriate for developmental age) Does the patient have difficulty walking or climbing stairs?: No Weakness of Legs: None Weakness of Arms/Hands: None  Permission Sought/Granted Permission sought to share information with : Case Manager                Emotional Assessment Appearance:: Appears stated age Attitude/Demeanor/Rapport: Guarded Affect (typically observed): Calm Orientation: : Oriented to Self, Oriented to Place, Oriented to  Time, Oriented to Situation Alcohol / Substance Use: Not Applicable Psych Involvement: No (comment)  Admission diagnosis:  Guaiac positive stools [R19.5] AKI (acute kidney injury) ([place]) [N17.9] Acute kidney injury ([place]) [N17.9] Pancreatic lesion [K86.9] Symptomatic anemia [D64.9] Diarrhea, unspecified type [R19.7] Patient Active Problem List  Diagnosis Date Noted   Elevated serum immunoglobulin free light chain level 02/08/2022   Melena    Anemia of chronic disease    C. 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

## 2022-08-08 DIAGNOSIS — D649 Anemia, unspecified: Secondary | ICD-10-CM

## 2022-08-08 DIAGNOSIS — N179 Acute kidney failure, unspecified: Secondary | ICD-10-CM | POA: Diagnosis not present

## 2022-08-08 LAB — MAGNESIUM: Magnesium: 1.8 mg/dL (ref 1.7–2.4)

## 2022-08-08 LAB — CBC
HCT: 27.3 % — ABNORMAL LOW (ref 36.0–46.0)
Hemoglobin: 9 g/dL — ABNORMAL LOW (ref 12.0–15.0)
MCH: 29.1 pg (ref 26.0–34.0)
MCHC: 33 g/dL (ref 30.0–36.0)
MCV: 88.3 fL (ref 80.0–100.0)
Platelets: 152 10*3/uL (ref 150–400)
RBC: 3.09 MIL/uL — ABNORMAL LOW (ref 3.87–5.11)
RDW: 14.7 % (ref 11.5–15.5)
WBC: 7.5 10*3/uL (ref 4.0–10.5)
nRBC: 0 % (ref 0.0–0.2)

## 2022-08-08 LAB — BASIC METABOLIC PANEL
Anion gap: 7 (ref 5–15)
BUN: 57 mg/dL — ABNORMAL HIGH (ref 8–23)
CO2: 18 mmol/L — ABNORMAL LOW (ref 22–32)
Calcium: 8.5 mg/dL — ABNORMAL LOW (ref 8.9–10.3)
Chloride: 115 mmol/L — ABNORMAL HIGH (ref 98–111)
Creatinine, Ser: 4.42 mg/dL — ABNORMAL HIGH (ref 0.44–1.00)
GFR, Estimated: 10 mL/min — ABNORMAL LOW (ref >60)
Glucose, Bld: 208 mg/dL — ABNORMAL HIGH (ref 70–99)
Potassium: 3.8 mmol/L (ref 3.5–5.1)
Sodium: 140 mmol/L (ref 135–145)

## 2022-08-08 LAB — GLUCOSE, CAPILLARY
Glucose-Capillary: 197 mg/dL — ABNORMAL HIGH (ref 70–99)
Glucose-Capillary: 159 mg/dL — ABNORMAL HIGH (ref 70–99)
Glucose-Capillary: 198 mg/dL — ABNORMAL HIGH (ref 70–99)
Glucose-Capillary: 240 mg/dL — ABNORMAL HIGH (ref 70–99)

## 2022-08-08 MED ORDER — SODIUM CHLORIDE 0.45 % IV SOLN: INTRAVENOUS | Status: DC

## 2022-08-08 MED ORDER — INSULIN GLARGINE-YFGN 100 UNIT/ML ~~LOC~~ SOLN
10.0000 [IU] | Freq: Every day | SUBCUTANEOUS | Status: DC
  Administered 2022-08-08 – 2022-08-10 (×3): 10 [IU] via SUBCUTANEOUS
  Filled 2022-08-08 (×3): qty 0.1

## 2022-08-08 MED ORDER — AMLODIPINE BESYLATE 10 MG PO TABS
10.0000 mg | ORAL_TABLET | Freq: Every day | ORAL | Status: DC
  Administered 2022-08-09 – 2022-08-10 (×2): 10 mg via ORAL
  Filled 2022-08-08 (×2): qty 1

## 2022-08-08 MED ORDER — AMLODIPINE BESYLATE 5 MG PO TABS
5.0000 mg | ORAL_TABLET | Freq: Once | ORAL | Status: AC
  Administered 2022-08-08: 5 mg via ORAL
  Filled 2022-08-08: qty 1

## 2022-08-08 MED ORDER — INSULIN ASPART 100 UNIT/ML IJ SOLN
3.0000 [IU] | Freq: Three times a day (TID) | INTRAMUSCULAR | Status: DC
  Administered 2022-08-08 – 2022-08-10 (×7): 3 [IU] via SUBCUTANEOUS
  Filled 2022-08-08 (×7): qty 1

## 2022-08-08 NOTE — Plan of Care (Signed)

## 2022-08-08 NOTE — Care Management Important Message (Signed)
Important Message  Patient Details  Name: MAVIS GRAVELLE MRN: [id number] Date of Birth: January 22, 1946   Medicare Important Message Given:  Yes     Dannette Barbara 08/08/2022, 12:39 PM

## 2022-08-08 NOTE — Progress Notes (Signed)
Occupational Therapy Treatment Patient Details Name: Patricia Cross MRN: [id number] DOB: November 09, 1945 Today's Date: 08/08/2022   History of present illness pt is a 76 year old female admitted with weakness, dark stool, AKI, Underwent endoscopy on 10/22 which showed medium size hiatal hernia, benign-appearing esophageal stenosis and duodenal polyp.  Focus was not dilated due to risk of bleeding in the setting of anemia.  Renal function slowly improving with IV fluids; PMH significant for     DM2, CKD stage II, anemia of chronic disease and iron deficiency, esophageal stricture, nephrolithiasis, upper GI bleed   OT comments  Pt seen for OT tx this date. Pt pleasant, denies complaints. Pt completed grooming tasks standing at the sink with supervision. Pt educated in activity pacing and home/routines modifications to support safe return to PLOF while pt ambulated around the nurses station x1 lap, pushing the IV pole. 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 Continues to benefit from support return to PLOF.    Recommendations for follow up therapy are one component of a multi-disciplinary discharge planning process, led by the attending physician.  Recommendations may be updated based on patient status, additional functional criteria and insurance authorization.    Follow Up Recommendations  Home health OT    Assistance Recommended at Discharge Intermittent Supervision/Assistance  Patient can return home with the following  A little help with walking and/or transfers;A little help with bathing/dressing/bathroom;Assistance with cooking/housework;Help with stairs or ramp for entrance   Equipment Recommendations  BSC/3in1    Recommendations for Other Services      Precautions / Restrictions Precautions Precautions: Fall Restrictions Weight Bearing Restrictions: No       Mobility Bed Mobility Overal bed mobility:  Modified Independent                  Transfers Overall transfer level: Needs assistance Equipment used: None Transfers: Sit to/from Stand Sit to Stand: Supervision                 Balance Overall balance assessment: Needs assistance Sitting-balance support: Feet supported Sitting balance-Leahy Scale: Good     Standing balance support: Single extremity supported Standing balance-Leahy Scale: Fair                             ADL either performed or assessed with clinical judgement   ADL Overall ADL's : Needs assistance/impaired     Grooming: Wash/dry hands;Standing;Supervision/safety;Wash/dry face;Oral care                                      Extremity/Trunk Assessment              Vision       Perception     Praxis      Cognition Arousal/Alertness: Awake/alert Behavior During Therapy: WFL for tasks assessed/performed Overall Cognitive Status: Within Functional Limits for tasks assessed                                          Exercises Other Exercises Other Exercises: Pt educated in activity pacing and home/routines modifications to support safe return to PLOF while pt ambulated around the nurses station x1 lap, pushing the IV pole.  Shoulder Instructions       General Comments      Pertinent Vitals/ Pain       Pain Assessment Pain Assessment: No/denies pain  Home Living                                          Prior Functioning/Environment              Frequency  Min 2X/week        Progress Toward Goals  OT Goals(current goals can now be found in the care plan section)  Progress towards OT goals: Progressing toward goals  Acute Rehab OT Goals Patient Stated Goal: return to PLOF OT Goal Formulation: With patient Time For Goal Achievement: 08/20/22 Potential to Achieve Goals: Good  Plan Discharge plan remains appropriate;Frequency remains appropriate     Co-evaluation                 AM-PAC OT "6 Clicks" Daily Activity     Outcome Measure   Help from another person eating meals?: None Help from another person taking care of personal grooming?: None Help from another person toileting, which includes using toliet, bedpan, or urinal?: None Help from another person bathing (including washing, rinsing, drying)?: A Little Help from another person to put on and taking off regular upper body clothing?: None Help from another person to put on and taking off regular lower body clothing?: A Little 6 Click Score: 22    End of Session    OT Visit Diagnosis: Unsteadiness on feet (R26.81)   Activity Tolerance Patient tolerated treatment well   Patient Left in bed;with call bell/phone within reach   Nurse Communication          Time: 9774-1423 OT Time Calculation (min): 15 min  Charges: OT General Charges $OT Visit: 1 Visit OT Treatments $Self Care/Home Management : 8-22 mins  Ardeth Perfect., MPH, MS, OTR/L ascom [phone] 08/08/22, 1:01 PM

## 2022-08-08 NOTE — Progress Notes (Signed)
Patricia Lame, MD [facility]   [address], [place], [place] [address] Phone: [phone] Fax : [phone]   Subjective: This patient has been stable and so has her hemoglobin.  The patient had an upper endoscopy with a stricture.  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  There is no sign of any recent bleeding.   Objective: Vital signs in last 24 hours: Vitals:   08/07/22 1627 08/07/22 2053 08/08/22 0601 08/08/22 0700  BP: (!) 149/61 (!) 157/61 (!) 158/64 (!) 150/60  Pulse: 81 72 64 68  Resp: 16     Temp: 97.9 F (36.6 C) 98.8 F (37.1 C) 98.1 F (36.7 C) 98.8 F (37.1 C)  TempSrc: Oral   Oral  SpO2: 100% 100% 100% 100%  Weight:      Height:       Weight change:   Intake/Output Summary (Last 24 hours) at 08/08/2022 1215 Last data filed at 08/08/2022 1000 Gross per 24 hour  Intake 240 ml  Output --  Net 240 ml     Exam: General: Patient is standing at the sink brushing her teeth in no apparent distress   Lab Results: '@LABTEST2'$ @ Micro Results: Recent Results (from the past 240 hour(s))  Urine Culture     Status: Abnormal   Collection Time: 08/05/22 12:05 AM   Specimen: Urine, Clean Catch  Result Value Ref Range Status   Specimen Description   Final    URINE, CLEAN CATCH Performed at [facility], [address], [place], [place] [address]    Special Requests   Final    NONE Performed at [facility], [address], [place], [place] [address]    Culture (A)  Final    <10,000 COLONIES/mL INSIGNIFICANT GROWTH Performed at [facility] [address], [place], [place] [address]    Report Status 08/06/2022 FINAL  Final   Studies/Results: No results found. Medications: I have reviewed the patient's current medications. Scheduled Meds:  amLODipine  5 mg Oral Daily   ferrous sulfate  325 mg Oral BID WC   glucagon (human recombinant)  1 mg Intravenous Once   insulin aspart  0-5  Units Subcutaneous QHS   insulin aspart  0-6 Units Subcutaneous TID WC   insulin aspart  3 Units Subcutaneous TID WC   insulin glargine-yfgn  10 Units Subcutaneous Daily   pantoprazole  40 mg Oral QAC breakfast   rosuvastatin  40 mg Oral Daily   senna  1 tablet Oral BID   Continuous Infusions:  sodium chloride 75 mL/hr at 08/08/22 0849   PRN Meds:.acetaminophen **OR** acetaminophen, fentaNYL (SUBLIMAZE) injection, guaiFENesin, hydrALAZINE, ipratropium-albuterol, metoprolol tartrate, ondansetron (ZOFRAN) IV, senna-docusate, traZODone   Assessment: Principal Problem:   AKI (acute kidney injury) ([place]) Active Problems:   GI bleeding   Hypertension   Type 2 diabetes mellitus with hyperglycemia, without long-term current use of insulin (HCC)   Anemia of chronic disease    Plan: This patient has a history of anemia of chronic disease with an acute drop in hemoglobin with an upper endoscopy showing a esophageal stricture.  The patient has had longstanding dysphagia.  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  Pathology is still pending.  The patient should follow-up with her primary gastroenterologist for dilation of the esophagus and a possible colonoscopy.  Her primary gastroenterologist Dr. Alice Reichert.  Nothing further to  do from a GI point of view at this time.  I will sign off.  Please call if any further GI concerns or questions.  We would like to thank you for the opportunity to participate in the care of Patricia Cross.    LOS: 4 days   Lewayne Bunting 08/08/2022, 12:15 PM Pager [phone] 7am-5pm  Check AMION for 5pm -7am coverage and on weekends

## 2022-08-08 NOTE — Progress Notes (Signed)
PROGRESS NOTE    Patricia Cross  OAC:[id number] DOB: Sep 16, 1946 DOA: 08/04/2022 PCP: Perrin Maltese, MD   Brief Narrative:  76 year old with history of DM2, CKD stage II, anemia of chronic disease and iron deficiency, esophageal stricture, nephrolithiasis, upper GI bleed admitted for weakness and dark stool.  Admission hemoglobin 7.2, baseline 10.9.  Also found to have acute kidney injury with creatinine of 7.1.  CT of the abdomen pelvis showed nonobstructive Renal stone, enlarged pancreatic duct.  Patient was started on PPI, received 1 unit PRBC transfusion and GI was notified.  Underwent endoscopy on 10/22 which showed medium size hiatal hernia, benign-appearing esophageal stenosis and duodenal polyp.  Focus was not dilated due to risk of bleeding in the setting of anemia.  Renal function slowly improving with IV fluids   Assessment & Plan:  Principal Problem:   AKI (acute kidney injury) ([place]) Active Problems:   GI bleeding   Hypertension   Type 2 diabetes mellitus with hyperglycemia, without long-term current use of insulin (HCC)   Anemia of chronic disease     Assessment and Plan: * AKI (acute kidney injury) ([place]) on CKD 3 A Patient's baseline creatinine 1.79 in March thousand 23.  Admission creatinine 7.10 > 6.39.   Getting IV fluids.  Follows with outpatient Dr. Zollie Scale.  With IV fluid creatinine improved 4.42  Hypomagnesemia/hypokalemia - As needed repletion  GI bleeding, suspect upper GI bleed Patient in January 2023 with possible UGI bleed. She has h/o Esophageal stricture and abnormal swallow.  Melanotic stools have resolved.  Underwent EGD 10/20 which showed medium size hiatal hernia, benign-appearing esophageal stenosis and duodenal polyp.  Dilation was not performed due to further risk of bleeding in the setting of anemia.  For now plan is to get outpatient colonoscopy with her primary GI unless if she develops any significant bleeding during the hospitalization.   Hemoglobin stable for now  Anemia of chronic disease History of CKD, iron deficiency and blood loss.  Baseline hemoglobin 10.5, admission hemoglobin 7.2.  After receiving 2 units of PRBC transfusion her hemoglobin is stable, globin stable at 9.0  Type 2 diabetes mellitus with hyperglycemia, without long-term current use of insulin (HCC) -A1c 6.5.  Initially having significant hypoglycemic episodes due to poor renal clearance.  Currently on sliding scale, added Semglee 10 units daily, NovoLog 3 units Premeal  Nonobstructive renal stone - Hydration.  Dilated pancreatic duct - Continue to monitor, will benefit from MRCP but will defer this to GI.  Does not seem to be urgent at this time.  Right renal lesion, 1.2 cm - Outpatient MRI.  Hypertension Norvasc.  IV as needed.  /OT-home health  DVT prophylaxis: Place TED hose Start: 08/04/22 2148 Code Status: Full code Family Communication:    Status is: Inpatient Ongoing management for acute kidney injury in the meantime monitoring for any further signs of bleeding.  Discharge once renal function stabilizes hopefully in the next couple of days  Subjective: Tolerating orals.  Urine is overall clear.  Denies any shortness of breath  Examination: Constitutional: Not in acute distress Respiratory: Clear to auscultation bilaterally Cardiovascular: Normal sinus rhythm, no rubs Abdomen: Nontender nondistended good bowel sounds Musculoskeletal: No edema noted Skin: No rashes seen Neurologic: CN 2-12 grossly intact.  And nonfocal Psychiatric: Normal judgment and insight. Alert and oriented x 3. Normal mood.  Objective: Vitals:   08/07/22 0751 08/07/22 1627 08/07/22 2053 08/08/22 0601  BP: (!) 154/74 (!) 149/61 (!) 157/61 (!) 158/64  Pulse: 69 81  72 64  Resp: 16 16    Temp: 98 F (36.7 C) 97.9 F (36.6 C) 98.8 F (37.1 C) 98.1 F (36.7 C)  TempSrc:  Oral    SpO2: 99% 100% 100% 100%  Weight:      Height:       No intake or  output data in the 24 hours ending 08/08/22 0808  Filed Weights   08/05/22 0933 08/05/22 1240  Weight: 47.6 kg 48.1 kg     Data Reviewed:   CBC: Recent Labs  Lab 08/04/22 1303 08/05/22 0428 08/05/22 1751 08/06/22 0439 08/07/22 0421 08/08/22 0430  WBC 6.0  --   --  5.6 7.3 7.5  HGB 7.2* 7.4* 10.3* 9.0* 9.7* 9.0*  HCT 22.3* 22.0* 30.7* 26.4* 28.4* 27.3*  MCV 90.3  --   --  86.3 88.2 88.3  PLT 227  --   --  163 167 244   Basic Metabolic Panel: Recent Labs  Lab 08/04/22 1303 08/05/22 0428 08/06/22 0439 08/07/22 0421 08/08/22 0430  NA 138 142 139 140 140  K 4.5 3.9 3.6 4.4 3.8  CL 107 113* 113* 115* 115*  CO2 21* 21* 19* 18* 18*  GLUCOSE 234* 45* 200* 215* 208*  BUN 102* 94* 72* 66* 57*  CREATININE 7.10* 6.39* 5.51* 4.93* 4.42*  CALCIUM 8.9 8.3* 8.4* 8.7* 8.5*  MG  --   --  1.5* 2.2 1.8   GFR: Estimated Creatinine Clearance: 8 mL/min (A) (by C-G formula based on SCr of 4.42 mg/dL (H)). Liver Function Tests: Recent Labs  Lab 08/04/22 1303  AST 46*  ALT 50*  ALKPHOS 74  BILITOT 0.9  PROT 7.1  ALBUMIN 3.5   No results for input(s): "LIPASE", "AMYLASE" in the last 168 hours. No results for input(s): "AMMONIA" in the last 168 hours. Coagulation Profile: No results for input(s): "INR", "PROTIME" in the last 168 hours. Cardiac Enzymes: No results for input(s): "CKTOTAL", "CKMB", "CKMBINDEX", "TROPONINI" in the last 168 hours. BNP (last 3 results) No results for input(s): "PROBNP" in the last 8760 hours. HbA1C: No results for input(s): "HGBA1C" in the last 72 hours.  CBG: Recent Labs  Lab 08/06/22 2004 08/07/22 0748 08/07/22 1133 08/07/22 1624 08/07/22 2021  GLUCAP 304* 234* 440* 371* 312*   Lipid Profile: No results for input(s): "CHOL", "HDL", "LDLCALC", "TRIG", "CHOLHDL", "LDLDIRECT" in the last 72 hours. Thyroid Function Tests: No results for input(s): "TSH", "T4TOTAL", "FREET4", "T3FREE", "THYROIDAB" in the last 72 hours. Anemia Panel: No  results for input(s): "VITAMINB12", "FOLATE", "FERRITIN", "TIBC", "IRON", "RETICCTPCT" in the last 72 hours. Sepsis Labs: No results for input(s): "PROCALCITON", "LATICACIDVEN" in the last 168 hours.  Recent Results (from the past 240 hour(s))  Urine Culture     Status: Abnormal   Collection Time: 08/05/22 12:05 AM   Specimen: Urine, Clean Catch  Result Value Ref Range Status   Specimen Description   Final    URINE, CLEAN CATCH Performed at [facility], [address], [place], [place] [address]    Special Requests   Final    NONE Performed at [facility], [address], [place], [place] [address]    Culture (A)  Final    <10,000 COLONIES/mL INSIGNIFICANT GROWTH Performed at [facility] [address] [place], [place] [address]    Report Status 08/06/2022 FINAL  Final         Radiology Studies: No results found.      Scheduled Meds:  amLODipine  5  mg Oral Daily   ferrous sulfate  325 mg Oral BID WC   glucagon (human recombinant)  1 mg Intravenous Once   insulin aspart  0-5 Units Subcutaneous QHS   insulin aspart  0-6 Units Subcutaneous TID WC   pantoprazole  40 mg Oral QAC breakfast   rosuvastatin  40 mg Oral Daily   senna  1 tablet Oral BID   Continuous Infusions:  sodium chloride       LOS: 4 days   Time spent= 35 mins    Jonatan Wilsey Arsenio Loader, MD [facility]  If 7PM-7AM, please contact night-coverage  08/08/2022, 8:08 AM

## 2022-08-09 ENCOUNTER — Other Ambulatory Visit (HOSPITAL_COMMUNITY): Payer: Self-pay

## 2022-08-09 ENCOUNTER — Encounter: Payer: Self-pay | Admitting: Gastroenterology

## 2022-08-09 DIAGNOSIS — N179 Acute kidney failure, unspecified: Secondary | ICD-10-CM | POA: Diagnosis not present

## 2022-08-09 LAB — BASIC METABOLIC PANEL
Anion gap: 6 (ref 5–15)
BUN: 55 mg/dL — ABNORMAL HIGH (ref 8–23)
CO2: 19 mmol/L — ABNORMAL LOW (ref 22–32)
Calcium: 8.3 mg/dL — ABNORMAL LOW (ref 8.9–10.3)
Chloride: 112 mmol/L — ABNORMAL HIGH (ref 98–111)
Creatinine, Ser: 4.12 mg/dL — ABNORMAL HIGH (ref 0.44–1.00)
GFR, Estimated: 11 mL/min — ABNORMAL LOW (ref >60)
Glucose, Bld: 270 mg/dL — ABNORMAL HIGH (ref 70–99)
Potassium: 3.9 mmol/L (ref 3.5–5.1)
Sodium: 137 mmol/L (ref 135–145)

## 2022-08-09 LAB — CBC
HCT: 26.8 % — ABNORMAL LOW (ref 36.0–46.0)
Hemoglobin: 8.9 g/dL — ABNORMAL LOW (ref 12.0–15.0)
MCH: 29.2 pg (ref 26.0–34.0)
MCHC: 33.2 g/dL (ref 30.0–36.0)
MCV: 87.9 fL (ref 80.0–100.0)
Platelets: 155 10*3/uL (ref 150–400)
RBC: 3.05 MIL/uL — ABNORMAL LOW (ref 3.87–5.11)
RDW: 14.6 % (ref 11.5–15.5)
WBC: 6.7 10*3/uL (ref 4.0–10.5)
nRBC: 0 % (ref 0.0–0.2)

## 2022-08-09 LAB — GLUCOSE, CAPILLARY
Glucose-Capillary: 186 mg/dL — ABNORMAL HIGH (ref 70–99)
Glucose-Capillary: 216 mg/dL — ABNORMAL HIGH (ref 70–99)
Glucose-Capillary: 205 mg/dL — ABNORMAL HIGH (ref 70–99)
Glucose-Capillary: 149 mg/dL — ABNORMAL HIGH (ref 70–99)

## 2022-08-09 LAB — MAGNESIUM: Magnesium: 1.6 mg/dL — ABNORMAL LOW (ref 1.7–2.4)

## 2022-08-09 LAB — SURGICAL PATHOLOGY

## 2022-08-09 MED ORDER — GLIPIZIDE 5 MG PO TABS
2.5000 mg | ORAL_TABLET | Freq: Two times a day (BID) | ORAL | Status: DC
  Administered 2022-08-09 – 2022-08-10 (×3): 2.5 mg via ORAL
  Filled 2022-08-09 (×3): qty 0.5

## 2022-08-09 MED ORDER — INSULIN STARTER KIT- PEN NEEDLES (ENGLISH)
1.0000 | Freq: Once | Status: AC
  Administered 2022-08-09: 1
  Filled 2022-08-09: qty 1

## 2022-08-09 NOTE — Inpatient Diabetes Management (Signed)
Inpatient Diabetes Program Recommendations  AACE/ADA: New Consensus Statement on Inpatient Glycemic Control   Target Ranges:  Prepandial:   less than 140 mg/dL      Peak postprandial:   less than 180 mg/dL (1-2 hours)      Critically ill patients:  140 - 180 mg/dL    Latest Reference Range & Units 08/08/22 08:13 08/08/22 11:58 08/08/22 16:17 08/08/22 19:59 08/09/22 08:59 08/09/22 11:36  Glucose-Capillary 70 - 99 mg/dL 197 (H) 159 (H) 198 (H) 240 (H) 186 (H) 216 (H)   Review of Glycemic Control  Diabetes history: DM2 Outpatient Diabetes medications: Glipizide 10 mg BID, Januvia 100 mg daily, Metformin 1000 mg BID Current orders for Inpatient glycemic control: Semglee 10 units QD, Novolog 0-6 units TID with meals, Novolog 0-5 units QHS, Novolog 3 units TID with meals, Glipizide 2.5 mg BID  Inpatient Diabetes Program Recommendations:    Outpatient DM medications: Since patient lives alone, does not get symptoms with hypoglycemia, and with cost being an issue,  would recommend to discharge on basal insulin (Lantus) once a day along with low dose Glipizide. Please provide Rx for Lantus [employer] [phone]) pens and pen needles [phone]).   NOTE: Received consult for insulin education as patient will be discharged on insulin. Spoke with patient at bedside regarding DM control and discharging new to insulin. Patient reports that she lives by herself and that she has been taking oral DM medication for DM as an outpatient. Patient has never used insulin. Discussed current insulin regimen and how Semglee and Novolog insulin work. Discussed insulin storage, insulin injection sites, and importance of rotating sites.  Patient states she would be agreeable to using insulin as an outpatient as long as it was affordable. Educated patient on insulin pen use at home. Informed patient that an insulin flexpen starter kit has been ordered and she should receive from RN once it is received from pharmacy. Reviewed all  steps of insulin pen including attachment of needle, 2-unit air shot, dialing up dose, giving injection, removing needle, disposal of sharps, storage of unused insulin, disposal of insulin etc. Patient able to provide successful return demonstration with verbal cues. Had the patient demonstrate 2 times and patient states she feels she can use the insulin pen. Informed patient that I would ask nursing to allow her to self administer insulin while inpatient. Attached insulin pen video and instructions to discharge instructions as well. Patient states that she does not get any symptoms of hypoglycemia and that she frequently was having glucose values less than 70 mg/dl at home prior to admission. Patient is checking glucose 2-3 times per day. Discussed FreeStyle Libre3 CGM and how it can be set up to alarm if she has a glucose less than 70 mg/dl.  Patient reports that she will think about using the [name] and she would like to discuss with her PCP. Encouraged patient to check glucose 3-4 times per day and to be sure to reach out to her PCP if she has any issues at all with glucose less than  70 mg/dl so they can instruct her on what to change. Patient verbalized understanding of information discussed and has no questions at this time. Communicated with Dr. Reesa Chew and RN regarding conversation with patient. Had initially recommended to consider discharging on once a day basal and Tradjenta (due to low risk of hypoglycemia and it is eliminated via feces). However, Dr. Reesa Chew reports that patient's son has expressed cost will be an issue. [place] pharmacy checked to  see which basal/bolus insulin is covered, what copays would be, and how much Tradjenta would be. Patient's insurance covers Lantus and Humalog insulin pens (copay is $35 each) and Tradjenta copay would be $132.80.  Therefore, will recommend discharging on Lantus once a day and low dose Glipizide and have patient to follow up with PCP shortly after  discharge.  Thanks, Patricia Alderman, RN, MSN, [place] Diabetes Coordinator Inpatient Diabetes Program [phone] (Team Pager from 8am to [place])

## 2022-08-09 NOTE — Discharge Instructions (Signed)
Please ask your primary care provider about using FreeStyle Libre3 continuous glucose monitoring sensor for glucose monitoring as it will alarm if you experience a low glucose (less than 70 mg/dl).

## 2022-08-09 NOTE — TOC Initial Note (Signed)
Transition of Care [facility]) - Initial/Assessment Note    Patient Details  Name: Patricia Cross MRN: [id number] Date of Birth: 02-25-1946  [facility] CM/SW Contact:    Colen Darling, [place] Phone Number: 08/09/2022, 4:46 PM  Clinical Narrative:                  SW spoke to the patient and she is not interested in home health PT. She has a walker at home. Her PCP is [facility] Humphrey Rolls [facility]. She drives herself. Pharmacy is Paediatric nurse on [address]. She picks up medication in person.  Expected Discharge Plan: [place] Barriers to Discharge: Continued Medical Work up   Patient Goals and CMS Choice   CMS Medicare.gov Compare Post Acute Care list provided to:: Patient Choice offered to / list presented to : Patient  Expected Discharge Plan and Services Expected Discharge Plan: [place]   Discharge Planning Services: CM Consult Post Acute Care Choice: Durable Medical Equipment Living arrangements for the past 2 months: Single Family Home                           HH Arranged: NA [place]: NA        Prior Living Arrangements/Services Living arrangements for the past 2 months: Single Family Home Lives with:: Self Patient language and need for interpreter reviewed:: No Do you feel safe going back to the place where you live?: Yes      Need for Family Participation in Patient Care: Yes (Comment) Care giver support system in place?: No (comment) Current home services: DME (walker) Criminal Activity/Legal Involvement Pertinent to Current Situation/Hospitalization: No - Comment as needed  Activities of Daily Living Home Assistive Devices/Equipment: CBG Meter ADL Screening (condition at time of admission) Patient's cognitive ability adequate to safely complete daily activities?: Yes Is the patient deaf or have difficulty hearing?: No Does the patient have difficulty seeing, even when wearing  glasses/contacts?: No Does the patient have difficulty concentrating, remembering, or making decisions?: No Patient able to express need for assistance with ADLs?: Yes Does the patient have difficulty dressing or bathing?: No Independently performs ADLs?: Yes (appropriate for developmental age) Does the patient have difficulty walking or climbing stairs?: No Weakness of Legs: None Weakness of Arms/Hands: None  Permission Sought/Granted Permission sought to share information with : Case Manager                Emotional Assessment Appearance:: Appears stated age Attitude/Demeanor/Rapport: Apprehensive Affect (typically observed): Calm Orientation: : Oriented to Place, Oriented to Self, Oriented to  Time, Oriented to Situation Alcohol / Substance Use: Not Applicable Psych Involvement: No (comment)  Admission diagnosis:  Guaiac positive stools [R19.5] AKI (acute kidney injury) ([place]) [N17.9] Acute kidney injury ([place]) [N17.9] Pancreatic lesion [K86.9] Symptomatic anemia [D64.9] Diarrhea, unspecified type [R19.7] Patient Active Problem List   Diagnosis Date Noted   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

## 2022-08-09 NOTE — Progress Notes (Signed)
Physical Therapy Treatment Patient Details Name: Patricia Cross MRN: [id number] DOB: Aug 23, 1946 Today's Date: 08/09/2022   History of Present Illness pt is a 76 year old female admitted with weakness, dark stool, AKI, Underwent endoscopy on 10/22 which showed medium size hiatal hernia, benign-appearing esophageal stenosis and duodenal polyp.  Focus was not dilated due to risk of bleeding in the setting of anemia.  Renal function slowly improving with IV fluids; PMH significant for     DM2, CKD stage II, anemia of chronic disease and iron deficiency, esophageal stricture, nephrolithiasis, upper GI bleed    PT Comments    Pt is making good progress towards goals with ability to ambulate in hallway without AD with supervision. Increased distance this session and stair training performed. Upgrading initial discharge recs to OP PT. Will continue to progress.   Recommendations for follow up therapy are one component of a multi-disciplinary discharge planning process, led by the attending physician.  Recommendations may be updated based on patient status, additional functional criteria and insurance authorization.  Follow Up Recommendations  Outpatient PT     Assistance Recommended at Discharge PRN  Patient can return home with the following A little help with walking and/or transfers   Equipment Recommendations  None recommended by PT    Recommendations for Other Services       Precautions / Restrictions Precautions Precautions: Fall Restrictions Weight Bearing Restrictions: No     Mobility  Bed Mobility               General bed mobility comments: NT, received sitting on EOB    Transfers Overall transfer level: Independent Equipment used: None Transfers: Sit to/from Stand Sit to Stand: Independent           General transfer comment: safe technique    Ambulation/Gait Ambulation/Gait assistance: Supervision Gait Distance (Feet): 300 Feet Assistive device:  None Gait Pattern/deviations: WFL(Within Functional Limits)       General Gait Details: slow speed, able to carry conversation during ambulation. No AD.   Stairs Stairs: Yes Stairs assistance: Min guard Stair Management: One rail Right, Forwards, Alternating pattern Number of Stairs: 4 General stair comments: safe technique with no breaks required   Wheelchair Mobility    Modified Rankin (Stroke Patients Only)       Balance Overall balance assessment: Needs assistance Sitting-balance support: Feet supported Sitting balance-Leahy Scale: Good     Standing balance support: No upper extremity supported Standing balance-Leahy Scale: Good                              Cognition Arousal/Alertness: Awake/alert Behavior During Therapy: WFL for tasks assessed/performed Overall Cognitive Status: Within Functional Limits for tasks assessed                                          Exercises      General Comments        Pertinent Vitals/Pain Pain Assessment Pain Assessment: No/denies pain    Home Living                          Prior Function            PT Goals (current goals can now be found in the care plan section) Acute Rehab PT Goals Patient Stated Goal: "go  home" PT Goal Formulation: With patient Time For Goal Achievement: 08/20/22 Potential to Achieve Goals: Good Progress towards PT goals: Progressing toward goals    Frequency    Min 2X/week      PT Plan Discharge plan needs to be updated    Co-evaluation              AM-PAC PT "6 Clicks" Mobility   Outcome Measure  Help needed turning from your back to your side while in a flat bed without using bedrails?: None Help needed moving from lying on your back to sitting on the side of a flat bed without using bedrails?: None Help needed moving to and from a bed to a chair (including a wheelchair)?: None Help needed standing up from a chair using your  arms (e.g., wheelchair or bedside chair)?: None Help needed to walk in hospital room?: None Help needed climbing 3-5 steps with a railing? : None 6 Click Score: 24    End of Session   Activity Tolerance: Patient tolerated treatment well Patient left: in bed (seated at EOB) Nurse Communication: Mobility status PT Visit Diagnosis: Unsteadiness on feet (R26.81);Muscle weakness (generalized) (M62.81)     Time: 4818-5631 PT Time Calculation (min) (ACUTE ONLY): 13 min  Charges:  $Gait Training: 8-22 mins                     Patricia Cross, PT, DPT, GCS [phone]    Patricia Cross 08/09/2022, 5:08 PM

## 2022-08-09 NOTE — Progress Notes (Signed)
[facility]  ROUNDING NOTE   Subjective:   Patricia Cross Is a 76 year old female with past medical conditions including diabetes, iron deficiency anemia, and chronic kidney disease.  Patient reports to the emergency department complaining of weakness and black tarry stools.  Patient has been admitted for Guaiac positive stools [R19.5] AKI (acute kidney injury) ([place]) [N17.9] Acute kidney injury ([place]) [N17.9] Pancreatic lesion [K86.9] Symptomatic anemia [D64.9] Diarrhea, unspecified type [R19.7]  Patient is known to our practice and is followed outpatient by Dr. Holley Raring.  Patient was last seen in office in May.  Patient does have history of upper GI bleeds.  She states she began feeling weak a couple of days prior to arrival.  States she noticed black tarry stools the morning prior to arrival.  Denies nausea or vomiting.  Denies cough or chest pain.  Labs on ED arrival concerning for creatinine 7.10 with GFR 5, serum bicarb 21, glucose 234, and hemoglobin 7.2.  Patient has received blood transfusions during this admission.  With current treatments patient's labs as of today include creatinine 4.12 with GFR 11, serum bicarb 19, and hemoglobin 8.9.  Patient underwent EGD on 08/05/2022 which found a medium size hiatal hernia, benign-appearing esophageal stenosis, and a large single duodenal polyp, removed for biopsy.  We were consulted to evaluate acute kidney injury.   Objective:  Vital signs in last 24 hours:  Temp:  [97.3 F (36.3 C)-98.6 F (37 C)] 98.4 F (36.9 C) (10/24 0833) Pulse Rate:  [61-70] 61 (10/24 0833) Resp:  [16-18] 18 (10/24 0833) BP: (121-155)/(57-69) 121/57 (10/24 0833) SpO2:  [99 %-100 %] 100 % (10/24 0833)  Weight change:  Filed Weights   08/05/22 0933 08/05/22 1240  Weight: 47.6 kg 48.1 kg    Intake/Output: I/O last 3 completed shifts: In: 1360.9 [P.O.:720; I.V.:640.9] Out: -    Intake/Output this shift:  No intake/output data  recorded.  Physical Exam: General: NAD  Head: Normocephalic, atraumatic. Moist oral mucosal membranes  Eyes: Anicteric  Lungs:  Clear to auscultation, normal effort, room air  Heart: Regular rate and rhythm  Abdomen:  Soft, nontender, nondistended  Extremities: 1+ peripheral edema.  Neurologic: Nonfocal, moving all four extremities  Skin: No lesions  Access: None    Basic Metabolic Panel: Recent Labs  Lab 08/05/22 0428 08/06/22 0439 08/07/22 0421 08/08/22 0430 08/09/22 0353  NA 142 139 140 140 137  K 3.9 3.6 4.4 3.8 3.9  CL 113* 113* 115* 115* 112*  CO2 21* 19* 18* 18* 19*  GLUCOSE 45* 200* 215* 208* 270*  BUN 94* 72* 66* 57* 55*  CREATININE 6.39* 5.51* 4.93* 4.42* 4.12*  CALCIUM 8.3* 8.4* 8.7* 8.5* 8.3*  MG  --  1.5* 2.2 1.8 1.6*    Liver Function Tests: Recent Labs  Lab 08/04/22 1303  AST 46*  ALT 50*  ALKPHOS 74  BILITOT 0.9  PROT 7.1  ALBUMIN 3.5   No results for input(s): "LIPASE", "AMYLASE" in the last 168 hours. No results for input(s): "AMMONIA" in the last 168 hours.  CBC: Recent Labs  Lab 08/04/22 1303 08/05/22 0428 08/05/22 1751 08/06/22 0439 08/07/22 0421 08/08/22 0430 08/09/22 0353  WBC 6.0  --   --  5.6 7.3 7.5 6.7  HGB 7.2*   < > 10.3* 9.0* 9.7* 9.0* 8.9*  HCT 22.3*   < > 30.7* 26.4* 28.4* 27.3* 26.8*  MCV 90.3  --   --  86.3 88.2 88.3 87.9  PLT 227  --   --  163 167 152 155   < > = values in this interval not displayed.    Cardiac Enzymes: No results for input(s): "CKTOTAL", "CKMB", "CKMBINDEX", "TROPONINI" in the last 168 hours.  BNP: Invalid input(s): "POCBNP"  CBG: Recent Labs  Lab 08/08/22 1158 08/08/22 1617 08/08/22 1959 08/09/22 0859 08/09/22 1136  GLUCAP 159* 198* 240* 186* 216*    Microbiology: Results for orders placed or performed during the hospital encounter of 08/04/22  Urine Culture     Status: Abnormal   Collection Time: 08/05/22 12:05 AM   Specimen: Urine, Clean Catch  Result Value Ref Range Status    Specimen Description   Final    URINE, CLEAN CATCH Performed at [facility], [address] [place], [place] [address]    Special Requests   Final    NONE Performed at [facility], [address], [place], [place] [address]    Culture (A)  Final    <10,000 COLONIES/mL INSIGNIFICANT GROWTH Performed at [facility], [place] [address] [place], [place] [address]    Report Status 08/06/2022 FINAL  Final    Coagulation Studies: No results for input(s): "LABPROT", "INR" in the last 72 hours.  Urinalysis: No results for input(s): "COLORURINE", "LABSPEC", "PHURINE", "GLUCOSEU", "HGBUR", "BILIRUBINUR", "KETONESUR", "PROTEINUR", "UROBILINOGEN", "NITRITE", "LEUKOCYTESUR" in the last 72 hours.  Invalid input(s): "APPERANCEUR"    Imaging: No results found.   Medications:     amLODipine  10 mg Oral Daily   ferrous sulfate  325 mg Oral BID WC   glipiZIDE  2.5 mg Oral BID AC   glucagon (human recombinant)  1 mg Intravenous Once   insulin aspart  0-5 Units Subcutaneous QHS   insulin aspart  0-6 Units Subcutaneous TID WC   insulin aspart  3 Units Subcutaneous TID WC   insulin glargine-yfgn  10 Units Subcutaneous Daily   insulin starter kit- pen needles  1 kit Other Once   pantoprazole  40 mg Oral QAC breakfast   rosuvastatin  40 mg Oral Daily   acetaminophen **OR** acetaminophen, fentaNYL (SUBLIMAZE) injection, guaiFENesin, hydrALAZINE, ipratropium-albuterol, metoprolol tartrate, ondansetron (ZOFRAN) IV, senna-docusate, traZODone  Assessment/ Plan:  [name] is a 76 y.o.  female with past medical conditions including diabetes, iron deficiency anemia, and chronic kidney disease.  Patient reports to the emergency department complaining of weakness and black tarry stools.  Patient has been admitted for Guaiac positive stools [R19.5] AKI (acute kidney injury) ([place]) [N17.9] Acute kidney injury ([place]) [N17.9] Pancreatic lesion [K86.9] Symptomatic  anemia [D64.9] Diarrhea, unspecified type [R19.7]   Acute Kidney Injury on chronic kidney disease stage IIIb with baseline creatinine 1.73 and GFR of 30 on 02/15/22.  Acute kidney injury likely secondary to dehydration, has improved greatly with IV fluids.  Creatinine on admission 7.10.  No acute indication for dialysis at this time.  Appetite has remained appropriate.  Patient cleared to discharge from my stance and will follow-up in office in 1 to 2 weeks.  Lab Results  Component Value Date   CREATININE 4.12 (H) 08/09/2022   CREATININE 4.42 (H) 08/08/2022   CREATININE 4.93 (H) 08/07/2022    Intake/Output Summary (Last 24 hours) at 08/09/2022 1529 Last data filed at 08/08/2022 1900 Gross per 24 hour  Intake 880.88 ml  Output --  Net 880.88 ml   2. Anemia of chronic kidney disease Lab Results  Component Value Date   HGB 8.9 (L) 08/09/2022    Hemoglobin below desired target.  We will continue  to monitor.  Patient has received blood transfusions during this admission.  3. Secondary Hyperparathyroidism: with outpatient labs: PTH 11, phosphorus 2.4, calcium 10.4 on 02/15/22.   Lab Results  Component Value Date   CALCIUM 8.3 (L) 08/09/2022   PHOS 2.9 11/03/2021  We will continue to monitor bone minerals during this admission.  4. Diabetes mellitus type II with chronic kidney disease/renal manifestations:noninsulin dependent. Home regimen includes Glipizide, Januvia, and metformin. Most recent hemoglobin A1c is 6.5 on 08/04/22.     LOS: 5 Cleburn Maiolo 10/24/20233:29 PM

## 2022-08-09 NOTE — Progress Notes (Addendum)
PROGRESS NOTE    Patricia Cross  CHE:[id number] DOB: 08/13/1946 DOA: 08/04/2022 PCP: Perrin Maltese, MD   Brief Narrative:  76 year old with history of DM2, CKD stage II, anemia of chronic disease and iron deficiency, esophageal stricture, nephrolithiasis, upper GI bleed admitted for weakness and dark stool.  Admission hemoglobin 7.2, baseline 10.9.  Also found to have acute kidney injury with creatinine of 7.1.  CT of the abdomen pelvis showed nonobstructive Renal stone, enlarged pancreatic duct.  Patient was started on PPI, received 1 unit PRBC transfusion and GI was notified.  Underwent endoscopy on 10/22 which showed medium size hiatal hernia, benign-appearing esophageal stenosis and duodenal polyp.  Focus was not dilated due to risk of bleeding in the setting of anemia.  Renal function slowly improving with IV fluids   Assessment & Plan:  Principal Problem:   AKI (acute kidney injury) ([place]) Active Problems:   GI bleeding   Hypertension   Type 2 diabetes mellitus with hyperglycemia, without long-term current use of insulin (HCC)   Symptomatic anemia     Assessment and Plan: * AKI (acute kidney injury) ([place]) on CKD 3 A Patient's baseline creatinine 1.79 in March '23.  Admission creatinine 7.10 > 6.39.   Follows with outpatient Dr. Zollie Scale.  With IV fluid creatinine improved 4.12.  Creatinine slowly appears to be plateauing.  We will get nephrology to weigh in to see when it is a good time for discharge with good outpatient follow-up versus continuing inpatient IV fluids.  Hypomagnesemia/hypokalemia - As needed repletion  GI bleeding, suspect upper GI bleed Patient in January 2023 with possible UGI bleed. She has h/o Esophageal stricture and abnormal swallow.  Melanotic stools have resolved.  Underwent EGD 10/20 which showed medium size hiatal hernia, benign-appearing esophageal stenosis and duodenal polyp.  Dilation was not performed due to further risk of bleeding in the setting  of anemia.  For now plan is to get outpatient colonoscopy with her primary GI unless if she develops any significant bleeding during the hospitalization. hemoglobin remained stable  Anemia of chronic disease History of CKD, iron deficiency and blood loss.  Baseline hemoglobin 10.5, admission hemoglobin 7.2.  After receiving 2 units of PRBC transfusion her hemoglobin is stable, globin stable at 9.0  Type 2 diabetes mellitus with hyperglycemia, without long-term current use of insulin (HCC) -A1c 6.5.  Initially hypoglycemic due to poor renal clearance.  Currently on sliding scale, added Semglee 10 units daily, NovoLog 3 units Premeal.  We will add her home glipizide at a lower dose of 2.5 mg twice daily.  Avoid metformin upon discharge.  Diabetic diet.  Nonobstructive renal stone - Hydration.  Dilated pancreatic duct - Continue to monitor, will benefit from MRCP but will defer this to GI.  Does not seem to be urgent at this time.  Right renal lesion, 1.2 cm - Outpatient MRI.  Hypertension Norvasc.  IV as needed.  /OT-home health  DVT prophylaxis: Place TED hose Start: 08/04/22 2148 Code Status: Full code Family Communication:  Arnell Sieving updated.   Status is: Inpatient Ongoing management for acute kidney injury in the meantime monitoring for any further signs of bleeding.  Discharge once renal function stabilizes hopefully in the next couple of days  Subjective: No complaints doing well  Examination: Constitutional: Not in acute distress Respiratory: Clear to auscultation bilaterally Cardiovascular: Normal sinus rhythm, no rubs Abdomen: Nontender nondistended good bowel sounds Musculoskeletal: No edema noted Skin: No rashes seen Neurologic: CN 2-12 grossly intact.  And nonfocal  Psychiatric: Normal judgment and insight. Alert and oriented x 3. Normal mood.  Objective: Vitals:   08/08/22 0700 08/08/22 1615 08/08/22 2011 08/09/22 0446  BP: (!) 150/60 (!) 155/67 (!) 145/69 (!)  148/65  Pulse: 68 68 70 64  Resp:  '16 18 18  '$ Temp: 98.8 F (37.1 C) 97.8 F (36.6 C) (!) 97.3 F (36.3 C) 98.6 F (37 C)  TempSrc: Oral  Oral   SpO2: 100% 100% 100% 99%  Weight:      Height:        Intake/Output Summary (Last 24 hours) at 08/09/2022 0811 Last data filed at 08/08/2022 1900 Gross per 24 hour  Intake 1360.88 ml  Output --  Net 1360.88 ml    Filed Weights   08/05/22 0933 08/05/22 1240  Weight: 47.6 kg 48.1 kg     Data Reviewed:   CBC: Recent Labs  Lab 08/04/22 1303 08/05/22 0428 08/05/22 1751 08/06/22 0439 08/07/22 0421 08/08/22 0430 08/09/22 0353  WBC 6.0  --   --  5.6 7.3 7.5 6.7  HGB 7.2*   < > 10.3* 9.0* 9.7* 9.0* 8.9*  HCT 22.3*   < > 30.7* 26.4* 28.4* 27.3* 26.8*  MCV 90.3  --   --  86.3 88.2 88.3 87.9  PLT 227  --   --  163 167 152 155   < > = values in this interval not displayed.   Basic Metabolic Panel: Recent Labs  Lab 08/05/22 0428 08/06/22 0439 08/07/22 0421 08/08/22 0430 08/09/22 0353  NA 142 139 140 140 137  K 3.9 3.6 4.4 3.8 3.9  CL 113* 113* 115* 115* 112*  CO2 21* 19* 18* 18* 19*  GLUCOSE 45* 200* 215* 208* 270*  BUN 94* 72* 66* 57* 55*  CREATININE 6.39* 5.51* 4.93* 4.42* 4.12*  CALCIUM 8.3* 8.4* 8.7* 8.5* 8.3*  MG  --  1.5* 2.2 1.8 1.6*   GFR: Estimated Creatinine Clearance: 8.5 mL/min (A) (by C-G formula based on SCr of 4.12 mg/dL (H)). Liver Function Tests: Recent Labs  Lab 08/04/22 1303  AST 46*  ALT 50*  ALKPHOS 74  BILITOT 0.9  PROT 7.1  ALBUMIN 3.5   No results for input(s): "LIPASE", "AMYLASE" in the last 168 hours. No results for input(s): "AMMONIA" in the last 168 hours. Coagulation Profile: No results for input(s): "INR", "PROTIME" in the last 168 hours. Cardiac Enzymes: No results for input(s): "CKTOTAL", "CKMB", "CKMBINDEX", "TROPONINI" in the last 168 hours. BNP (last 3 results) No results for input(s): "PROBNP" in the last 8760 hours. HbA1C: No results for input(s): "HGBA1C" in the  last 72 hours.  CBG: Recent Labs  Lab 08/07/22 2021 08/08/22 0813 08/08/22 1158 08/08/22 1617 08/08/22 1959  GLUCAP 312* 197* 159* 198* 240*   Lipid Profile: No results for input(s): "CHOL", "HDL", "LDLCALC", "TRIG", "CHOLHDL", "LDLDIRECT" in the last 72 hours. Thyroid Function Tests: No results for input(s): "TSH", "T4TOTAL", "FREET4", "T3FREE", "THYROIDAB" in the last 72 hours. Anemia Panel: No results for input(s): "VITAMINB12", "FOLATE", "FERRITIN", "TIBC", "IRON", "RETICCTPCT" in the last 72 hours. Sepsis Labs: No results for input(s): "PROCALCITON", "LATICACIDVEN" in the last 168 hours.  Recent Results (from the past 240 hour(s))  Urine Culture     Status: Abnormal   Collection Time: 08/05/22 12:05 AM   Specimen: Urine, Clean Catch  Result Value Ref Range Status   Specimen Description   Final    URINE, CLEAN CATCH Performed at [facility], [address], [place], [place] [address]  Special Requests   Final    NONE Performed at [facility], [address] [place], [place] [address]    Culture (A)  Final    <10,000 COLONIES/mL INSIGNIFICANT GROWTH Performed at [facility] [address] [place], [place] [address]    Report Status 08/06/2022 FINAL  Final         Radiology Studies: No results found.      Scheduled Meds:  amLODipine  10 mg Oral Daily   ferrous sulfate  325 mg Oral BID WC   glucagon (human recombinant)  1 mg Intravenous Once   insulin aspart  0-5 Units Subcutaneous QHS   insulin aspart  0-6 Units Subcutaneous TID WC   insulin aspart  3 Units Subcutaneous TID WC   insulin glargine-yfgn  10 Units Subcutaneous Daily   pantoprazole  40 mg Oral QAC breakfast   rosuvastatin  40 mg Oral Daily   senna  1 tablet Oral BID   Continuous Infusions:  sodium chloride 75 mL/hr at 08/09/22 0528     LOS: 5 days   Time spent= 35 mins    Adalid Beckmann Arsenio Loader, MD [facility]  If 7PM-7AM, please contact  night-coverage  08/09/2022, 8:11 AM

## 2022-08-10 DIAGNOSIS — N179 Acute kidney failure, unspecified: Secondary | ICD-10-CM | POA: Diagnosis not present

## 2022-08-10 LAB — BASIC METABOLIC PANEL
Anion gap: 7 (ref 5–15)
BUN: 57 mg/dL — ABNORMAL HIGH (ref 8–23)
CO2: 19 mmol/L — ABNORMAL LOW (ref 22–32)
Calcium: 8.7 mg/dL — ABNORMAL LOW (ref 8.9–10.3)
Chloride: 115 mmol/L — ABNORMAL HIGH (ref 98–111)
Creatinine, Ser: 4 mg/dL — ABNORMAL HIGH (ref 0.44–1.00)
GFR, Estimated: 11 mL/min — ABNORMAL LOW (ref >60)
Glucose, Bld: 162 mg/dL — ABNORMAL HIGH (ref 70–99)
Potassium: 3.5 mmol/L (ref 3.5–5.1)
Sodium: 141 mmol/L (ref 135–145)

## 2022-08-10 LAB — CBC
HCT: 26.1 % — ABNORMAL LOW (ref 36.0–46.0)
Hemoglobin: 8.8 g/dL — ABNORMAL LOW (ref 12.0–15.0)
MCH: 29.4 pg (ref 26.0–34.0)
MCHC: 33.7 g/dL (ref 30.0–36.0)
MCV: 87.3 fL (ref 80.0–100.0)
Platelets: 154 10*3/uL (ref 150–400)
RBC: 2.99 MIL/uL — ABNORMAL LOW (ref 3.87–5.11)
RDW: 14.7 % (ref 11.5–15.5)
WBC: 6.5 10*3/uL (ref 4.0–10.5)
nRBC: 0 % (ref 0.0–0.2)

## 2022-08-10 LAB — MAGNESIUM: Magnesium: 1.7 mg/dL (ref 1.7–2.4)

## 2022-08-10 LAB — GLUCOSE, CAPILLARY: Glucose-Capillary: 159 mg/dL — ABNORMAL HIGH (ref 70–99)

## 2022-08-10 MED ORDER — LANTUS SOLOSTAR 100 UNIT/ML ~~LOC~~ SOPN: 10.0000 [IU] | PEN_INJECTOR | Freq: Every day | SUBCUTANEOUS | 2 refills | Status: DC

## 2022-08-10 MED ORDER — INSULIN PEN NEEDLE 32G X 4 MM MISC: 1.0000 [IU] | Freq: Every day | 2 refills | Status: AC

## 2022-08-10 MED ORDER — GLIPIZIDE 5 MG PO TABS: 2.5000 mg | ORAL_TABLET | Freq: Two times a day (BID) | ORAL | 2 refills | Status: DC

## 2022-08-10 NOTE — Plan of Care (Signed)
Problem: Education: Goal: Ability to describe self-care measures that may prevent or decrease complications (Diabetes Survival Skills Education) will improve 08/10/2022 1001 by Mancel Bale, RN Outcome: Adequate for Discharge 08/10/2022 0903 by Mancel Bale, RN Outcome: Progressing Goal: Individualized Educational Video(s) 08/10/2022 1001 by Mancel Bale, RN Outcome: Adequate for Discharge 08/10/2022 0903 by Mancel Bale, RN Outcome: Progressing   Problem: Coping: Goal: Ability to adjust to condition or change in health will improve 08/10/2022 1001 by Mancel Bale, RN Outcome: Adequate for Discharge 08/10/2022 0903 by Mancel Bale, RN Outcome: Progressing   Problem: Fluid Volume: Goal: Ability to maintain a balanced intake and output will improve 08/10/2022 1001 by Mancel Bale, RN Outcome: Adequate for Discharge 08/10/2022 0903 by Mancel Bale, RN Outcome: Progressing   Problem: Health Behavior/Discharge Planning: Goal: Ability to identify and utilize available resources and services will improve 08/10/2022 1001 by Mancel Bale, RN Outcome: Adequate for Discharge 08/10/2022 1610 by Mancel Bale, RN Outcome: Progressing Goal: Ability to manage health-related needs will improve 08/10/2022 1001 by Mancel Bale, RN Outcome: Adequate for Discharge 08/10/2022 0903 by Mancel Bale, RN Outcome: Progressing   Problem: Metabolic: Goal: Ability to maintain appropriate glucose levels will improve 08/10/2022 1001 by Mancel Bale, RN Outcome: Adequate for Discharge 08/10/2022 0903 by Mancel Bale, RN Outcome: Progressing   Problem: Nutritional: Goal: Maintenance of adequate nutrition will improve 08/10/2022 1001 by Mancel Bale, RN Outcome: Adequate for Discharge 08/10/2022 0903 by Mancel Bale, RN Outcome: Progressing Goal: Progress toward achieving an optimal weight will improve 08/10/2022 1001 by Mancel Bale, RN Outcome: Adequate for Discharge 08/10/2022 9604 by Mancel Bale, RN Outcome: Progressing   Problem: Skin Integrity: Goal: Risk for impaired skin integrity will decrease 08/10/2022 1001 by Mancel Bale, RN Outcome: Adequate for Discharge 08/10/2022 5409 by Mancel Bale, RN Outcome: Progressing   Problem: Tissue Perfusion: Goal: Adequacy of tissue perfusion will improve 08/10/2022 1001 by Mancel Bale, RN Outcome: Adequate for Discharge 08/10/2022 0903 by Mancel Bale, RN Outcome: Progressing   Problem: Education: Goal: Knowledge of General Education information will improve Description: Including pain rating scale, medication(s)/side effects and non-pharmacologic comfort measures 08/10/2022 1001 by Mancel Bale, RN Outcome: Adequate for Discharge 08/10/2022 8119 by Mancel Bale, RN Outcome: Progressing   Problem: Health Behavior/Discharge Planning: Goal: Ability to manage health-related needs will improve 08/10/2022 1001 by Mancel Bale, RN Outcome: Adequate for Discharge 08/10/2022 1478 by Mancel Bale, RN Outcome: Progressing   Problem: Clinical Measurements: Goal: Ability to maintain clinical measurements within normal limits will improve 08/10/2022 1001 by Mancel Bale, RN Outcome: Adequate for Discharge 08/10/2022 2956 by Mancel Bale, RN Outcome: Progressing Goal: Will remain free from infection 08/10/2022 1001 by Mancel Bale, RN Outcome: Adequate for Discharge 08/10/2022 2130 by Mancel Bale, RN Outcome: Progressing Goal: Diagnostic test results will improve 08/10/2022 1001 by Mancel Bale, RN Outcome: Adequate for Discharge 08/10/2022 8657 by Mancel Bale, RN Outcome: Progressing Goal: Respiratory complications will improve 08/10/2022 1001 by Mancel Bale, RN Outcome: Adequate for Discharge 08/10/2022 8469 by Mancel Bale, RN Outcome: Progressing Goal: Cardiovascular  complication will be avoided 08/10/2022 1001 by Mancel Bale, RN Outcome: Adequate for Discharge 08/10/2022 6295 by Mancel Bale, RN Outcome: Progressing   Problem: Activity: Goal: Risk for activity intolerance will decrease 08/10/2022 1001 by Mancel Bale, RN Outcome: Adequate for Discharge  08/10/2022 0903 by Mancel Bale, RN Outcome: Progressing   Problem: Nutrition: Goal: Adequate nutrition will be maintained 08/10/2022 1001 by Mancel Bale, RN Outcome: Adequate for Discharge 08/10/2022 3244 by Mancel Bale, RN Outcome: Progressing   Problem: Coping: Goal: Level of anxiety will decrease 08/10/2022 1001 by Mancel Bale, RN Outcome: Adequate for Discharge 08/10/2022 0903 by Mancel Bale, RN Outcome: Progressing   Problem: Elimination: Goal: Will not experience complications related to bowel motility 08/10/2022 1001 by Mancel Bale, RN Outcome: Adequate for Discharge 08/10/2022 0903 by Mancel Bale, RN Outcome: Progressing Goal: Will not experience complications related to urinary retention 08/10/2022 1001 by Mancel Bale, RN Outcome: Adequate for Discharge 08/10/2022 0903 by Mancel Bale, RN Outcome: Progressing   Problem: Pain Managment: Goal: General experience of comfort will improve 08/10/2022 1001 by Mancel Bale, RN Outcome: Adequate for Discharge 08/10/2022 0102 by Mancel Bale, RN Outcome: Progressing   Problem: Safety: Goal: Ability to remain free from injury will improve 08/10/2022 1001 by Mancel Bale, RN Outcome: Adequate for Discharge 08/10/2022 7253 by Mancel Bale, RN Outcome: Progressing   Problem: Skin Integrity: Goal: Risk for impaired skin integrity will decrease 08/10/2022 1001 by Mancel Bale, RN Outcome: Adequate for Discharge 08/10/2022 6644 by Mancel Bale, RN Outcome: Progressing

## 2022-08-10 NOTE — Plan of Care (Signed)

## 2022-08-10 NOTE — Inpatient Diabetes Management (Signed)
Inpatient Diabetes Program Recommendations  AACE/ADA: New Consensus Statement on Inpatient Glycemic Control   Target Ranges:  Prepandial:   less than 140 mg/dL      Peak postprandial:   less than 180 mg/dL (1-2 hours)      Critically ill patients:  140 - 180 mg/dL    Latest Reference Range & Units 08/09/22 08:59 08/09/22 11:36 08/09/22 16:24 08/09/22 20:36 08/10/22 07:48  Glucose-Capillary 70 - 99 mg/dL 186 (H) 216 (H) 205 (H) 149 (H) 159 (H)    Review of Glycemic Control  Diabetes history: DM2 Outpatient Diabetes medications: Glipizide 10 mg BID, Januvia 100 mg daily, Metformin 1000 mg BID Current orders for Inpatient glycemic control: Semglee 10 units QD, Novolog 0-6 units TID with meals, Novolog 0-5 units QHS, Novolog 3 units TID with meals, Glipizide 2.5 mg BID   NOTE: Met with patient, her son Arnell Sieving, and Stanton Kidney (family) regarding DM control and insulin. Discussed glucose targets and plan to discharge on Glipizide 2.5 mg BID, Januvia 100 mg daily, and Lantus 10 units daily. Informed patient that she had received long acting insulin this morning so she will start the Lantus 10 units tomorrow morning. Reviewed insulin pen again with patient and family. Patient was able to demonstrate successfully how to use an insulin pen (provided verbal cues first time and patient was able to do it herself without any help the second time). Patient's son inquired about cost of Lantus and informed them that it should be $35 copay for the Lantus pens.  Encouraged patient to continue checking glucose at least 2 times per day. Discussed FreeStyle Libre3 and provided information packet on Libre3 CGM. Discussed safety feature of the Maurine Simmering as it would alarm if patient has glucose less than 70 mg/dl when the alarm is turn on. Patient plans to read about it and talk with her PCP about possibly using the FreeStyle Libre3.  Reviewed hypoglycemia along with treatment. Encouraged patient to get glucose tablets from pharmacy  when insulin is picked up so she can keep them close by and use if needed for hypoglycemia. Patient and son verbalized understanding of information and have no further questions at this time.  Thanks, Barnie Alderman, RN, MSN, [place] Diabetes Coordinator Inpatient Diabetes Program [phone] (Team Pager from 8am to [place])

## 2022-08-10 NOTE — Progress Notes (Signed)
Pt discharged home with family. PIV removed. Discharge instructions reviewed with patient and family, verbalize understanding. Diabetes coordinator educated patient on insulin pen instructions. Belongings packed up and given to patient.

## 2022-08-10 NOTE — Discharge Summary (Signed)
Physician Discharge Summary   Patricia Cross  female DOB: Sep 28, 1946  MVE:[id number]  PCP: Perrin Maltese, MD  Admit date: 08/04/2022 Discharge date: 08/10/2022  Admitted From: home Disposition:  home CODE STATUS: Full code  Discharge Instructions     Diet Carb Modified   Complete by: As directed    Discharge instructions   Complete by: As directed    For your diabetes, you have been started on insulin Lantus 10 units daily, and your glipizide is reduced to 2.5 mg twice daily.  Please stop metformin.  You have had severe kidney injury which has improved.  Please follow up with nephrology Dr. Candiss Norse 1-2 weeks after discharge.   Dr. Enzo Bi [facility] Course:  For full details, please see H&P, progress notes, consult notes and ancillary notes.  Briefly,  Patricia Cross is a 76 year old with history of DM2, CKD stage II, anemia of chronic disease and iron deficiency, esophageal stricture, nephrolithiasis, upper GI bleed admitted for weakness and dark stool.   Admission hemoglobin 7.2, baseline 10.9.  Also found to have acute kidney injury with creatinine of 7.1.  CT of the abdomen pelvis showed nonobstructive renal stone, enlarged pancreatic duct. Underwent endoscopy on 10/22 which showed medium size hiatal hernia, benign-appearing esophageal stenosis and duodenal polyp.  Esophagus was not dilated due to risk of bleeding in the setting of anemia.  Renal function slowly improving with IV fluids.  * AKI (acute kidney injury) ([place]) on CKD 3 A Patient's baseline creatinine 1.79 in March '23.  Admission creatinine 7.10.  Cr slowly improved with IVF, was 4.00 prior to discharge.  Pt will continue outpatient f/u with nephrology.   Hypomagnesemia/hypokalemia - monitored and repleted PRN   GI bleeding, suspect upper GI bleed Patient in January 2023 with possible UGI bleed. She has h/o Esophageal stricture and abnormal swallow.  Melanotic stools have resolved.   Underwent EGD 10/20 which showed medium size hiatal hernia, benign-appearing esophageal stenosis and duodenal polyp.  Dilation was not performed due to further risk of bleeding in the setting of anemia.  For now plan is to get outpatient colonoscopy with her primary GI.   Anemia of chronic disease History of CKD, iron deficiency and blood loss.  Baseline hemoglobin 10's. admission hemoglobin 7.2.  Received 2 units of PRBC transfusion.  Hgb stable 8-9's prior to discharge.   Type 2 diabetes mellitus with hyperglycemia, without long-term current use of insulin (HCC) -A1c 6.5.  Initially hypoglycemic due to poor renal clearance.   --pt was discharged on Lantus 10u daily and home glipizide reduced to 2.5 mg BID.  Teaching and education performed by diabetic coordinator. --home metformin d/c'ed.   Nonobstructive renal stone - Hydration.   Dilated pancreatic duct - consider nonemergent MRI pancreatic protocol as outpatient   Right renal lesion, 1.2 cm - Outpatient MRI.   Hypertension Cont home Norvasc.  Home HCTZ d/c'ed.   Discharge Diagnoses:  Principal Problem:   AKI (acute kidney injury) ([place]) Active Problems:   GI bleeding   Hypertension   Type 2 diabetes mellitus with hyperglycemia, without long-term current use of insulin (HCC)   Symptomatic anemia   30 Day Unplanned Readmission Risk Score    Flowsheet Row ED to Hosp-Admission (Current) from 08/04/2022 in [facility] [place]  30 Day Unplanned Readmission Risk Score (%) 16.93 Filed at 08/10/2022 0801       This score is the patient's risk of an  unplanned readmission within 30 days of being discharged (0 -100%). The score is based on dignosis, age, lab data, medications, orders, and past utilization.   Low:  0-14.9   Medium: 15-21.9   High: 22-29.9   Extreme: 30 and above         Discharge Instructions:  Allergies as of 08/10/2022   No Known Allergies      Medication List      STOP taking these medications    azithromycin 250 MG tablet Commonly known as: ZITHROMAX   hydrochlorothiazide 25 MG tablet Commonly known as: HYDRODIURIL   metFORMIN 1000 MG tablet Commonly known as: GLUCOPHAGE       TAKE these medications    amLODipine 10 MG tablet Commonly known as: NORVASC Take 10 mg by mouth daily.   ASPIRIN 81 PO Aspirin 81 MG Oral Tablet QTY: 0 tablet Days: 0 Refills: 0  Written: 09/07/18 Patient Instructions:   Centrum Silver tablet Centrum Silver Oral Tablet QTY: 0 tablet Days: 0 Refills: 0  Written: 11/12/15 Patient Instructions:   ferrous sulfate 325 (65 FE) MG EC tablet Take 1 tablet (325 mg total) by mouth 2 (two) times daily with a meal.   glipiZIDE 5 MG tablet Commonly known as: GLUCOTROL Take 0.5 tablets (2.5 mg total) by mouth 2 (two) times daily before a meal. What changed:  medication strength how much to take when to take this   Insulin Pen Needle 32G X 4 MM Misc 1 Units by Does not apply route daily.   Januvia 100 MG tablet Generic drug: sitaGLIPtin Take 100 mg by mouth daily.   Lantus SoloStar 100 UNIT/ML Solostar Pen Generic drug: insulin glargine Inject 10 Units into the skin daily.   metoCLOPramide 5 MG tablet Commonly known as: REGLAN Take 5 mg by mouth 2 (two) times daily.   pantoprazole 40 MG tablet Commonly known as: PROTONIX Take 1 tablet (40 mg total) by mouth daily.   rosuvastatin 40 MG tablet Commonly known as: CRESTOR Take 40 mg by mouth daily.               Durable Medical Equipment  (From admission, onward)           Start     Ordered   08/06/22 1520  For home use only DME Walker rolling  Once       Question Answer Comment  Walker: With 5 Inch Wheels   Patient needs a walker to treat with the following condition Mobility impaired      08/06/22 1520   08/06/22 1520  For home use only DME Bedside commode  Once       Question:  Patient needs a bedside commode to treat with the  following condition  Answer:  Mobility impaired   08/06/22 1520   08/06/22 1450  For home use only DME 3 n 1  Once        08/06/22 1450             Follow-up Information     Murlean Iba, MD Follow up in 2 week(s).   Specialty: Nephrology Contact information: [place] [address] [phone]         Perrin Maltese, MD Follow up in 1 week(s).   Specialty: Internal Medicine Contact information: [address] [place] [address] [phone]                 No Known Allergies   The results of significant diagnostics  from this hospitalization (including imaging, microbiology, ancillary and laboratory) are listed below for reference.   Consultations:   Procedures/Studies: CT Renal Stone Study  Result Date: 08/04/2022 CLINICAL DATA:  Nephrolithiasis CR+ and hx stone EXAM: CT ABDOMEN AND PELVIS WITHOUT CONTRAST TECHNIQUE: Multidetector CT imaging of the abdomen and pelvis was performed following the standard protocol without IV contrast. RADIATION DOSE REDUCTION: This exam was performed according to the departmental dose-optimization program which includes automated exposure control, adjustment of the mA and/or kV according to patient size and/or use of iterative reconstruction technique. COMPARISON:  None Available. FINDINGS: Lower chest: Severe atherosclerotic plaque. Coronary artery calcification. No acute abnormality. Hepatobiliary: No focal liver abnormality. No gallstones, gallbladder wall thickening, or pericholecystic fluid. No biliary dilatation. Pancreas: No focal lesion. Normal pancreatic contour. No surrounding inflammatory changes. Nonspecific prominent proximal main pancreatic measuring up to 6 mm. No main pancreatic ductal dilatation. Spleen: Normal in size without focal abnormality. Adrenals/Urinary Tract: No adrenal nodule bilaterally. Bilateral amorphous calcifications that may represent calcified stones. No  hydronephrosis. Hyperdense 1.2 cm right renal lesion with a density of 58 Hounsfield units. No ureterolithiasis or hydroureter. The urinary bladder is unremarkable. Stomach/Bowel: Stomach is within normal limits. No evidence of bowel wall thickening or dilatation. Appendiceal stump with appendicolith noted within its lumen distally. No inflammatory changes along the right lower quadrant to suggest appendicitis. Vascular/Lymphatic: No abdominal aorta or iliac aneurysm. Severe atherosclerotic plaque of the aorta and its branches. No abdominal, pelvic, or inguinal lymphadenopathy. Reproductive: Status post hysterectomy. No adnexal masses. Other: No intraperitoneal free fluid. No intraperitoneal free gas. No organized fluid collection. Musculoskeletal: No abdominal wall hernia or abnormality. No suspicious lytic or blastic osseous lesions. No acute displaced fracture. L5-S1 intervertebral disc space vacuum phenomenon. IMPRESSION: 1. Bilateral amorphous calcifications that may represent nonobstructive calcified stones. 2. Nonspecific prominent proximal main pancreatic measuring up to 6 mm. No associated biliary ductal dilatation. Question IPMN. When the patient is clinically stable and able to follow directions and hold their breath (preferably as an outpatient) further evaluation with dedicated nonemergent MRI pancreatic protocol should be considered. 3. Indeterminate 1.2 cm right renal lesion. Finding can be further evaluated on the MRI. 4.  Aortic Atherosclerosis (ICD10-I70.0). Electronically Signed   By: Iven Finn M.D.   On: 08/04/2022 18:23      Labs: BNP (last 3 results) No results for input(s): "BNP" in the last 8760 hours. Basic Metabolic Panel: Recent Labs  Lab 08/06/22 0439 08/07/22 0421 08/08/22 0430 08/09/22 0353 08/10/22 0428  NA 139 140 140 137 141  K 3.6 4.4 3.8 3.9 3.5  CL 113* 115* 115* 112* 115*  CO2 19* 18* 18* 19* 19*  GLUCOSE 200* 215* 208* 270* 162*  BUN 72* 66* 57* 55* 57*   CREATININE 5.51* 4.93* 4.42* 4.12* 4.00*  CALCIUM 8.4* 8.7* 8.5* 8.3* 8.7*  MG 1.5* 2.2 1.8 1.6* 1.7   Liver Function Tests: Recent Labs  Lab 08/04/22 1303  AST 46*  ALT 50*  ALKPHOS 74  BILITOT 0.9  PROT 7.1  ALBUMIN 3.5   No results for input(s): "LIPASE", "AMYLASE" in the last 168 hours. No results for input(s): "AMMONIA" in the last 168 hours. CBC: Recent Labs  Lab 08/06/22 0439 08/07/22 0421 08/08/22 0430 08/09/22 0353 08/10/22 0428  WBC 5.6 7.3 7.5 6.7 6.5  HGB 9.0* 9.7* 9.0* 8.9* 8.8*  HCT 26.4* 28.4* 27.3* 26.8* 26.1*  MCV 86.3 88.2 88.3 87.9 87.3  PLT 163 167 152 155 154   Cardiac Enzymes: No  results for input(s): "CKTOTAL", "CKMB", "CKMBINDEX", "TROPONINI" in the last 168 hours. BNP: Invalid input(s): "POCBNP" CBG: Recent Labs  Lab 08/09/22 0859 08/09/22 1136 08/09/22 1624 08/09/22 2036 08/10/22 0748  GLUCAP 186* 216* 205* 149* 159*   D-Dimer No results for input(s): "DDIMER" in the last 72 hours. Hgb A1c No results for input(s): "HGBA1C" in the last 72 hours. Lipid Profile No results for input(s): "CHOL", "HDL", "LDLCALC", "TRIG", "CHOLHDL", "LDLDIRECT" in the last 72 hours. Thyroid function studies No results for input(s): "TSH", "T4TOTAL", "T3FREE", "THYROIDAB" in the last 72 hours.  Invalid input(s): "FREET3" Anemia work up No results for input(s): "VITAMINB12", "FOLATE", "FERRITIN", "TIBC", "IRON", "RETICCTPCT" in the last 72 hours. Urinalysis    Component Value Date/Time   COLORURINE COLORLESS (A) 08/05/2022 0005   APPEARANCEUR CLEAR (A) 08/05/2022 0005   LABSPEC 1.008 08/05/2022 0005   PHURINE 6.0 08/05/2022 0005   GLUCOSEU 50 (A) 08/05/2022 0005   HGBUR SMALL (A) 08/05/2022 0005   BILIRUBINUR NEGATIVE 08/05/2022 0005   KETONESUR NEGATIVE 08/05/2022 0005   PROTEINUR NEGATIVE 08/05/2022 0005   NITRITE NEGATIVE 08/05/2022 0005   LEUKOCYTESUR NEGATIVE 08/05/2022 0005   Sepsis Labs Recent Labs  Lab 08/07/22 0421 08/08/22 0430  08/09/22 0353 08/10/22 0428  WBC 7.3 7.5 6.7 6.5   Microbiology Recent Results (from the past 240 hour(s))  Urine Culture     Status: Abnormal   Collection Time: 08/05/22 12:05 AM   Specimen: Urine, Clean Catch  Result Value Ref Range Status   Specimen Description   Final    URINE, CLEAN CATCH Performed at [facility] [address], [place], [place] [address]    Special Requests   Final    NONE Performed at [facility], [address], [place], [place] [address]    Culture (A)  Final    <10,000 COLONIES/mL INSIGNIFICANT GROWTH Performed at [facility], [place] [address], [place], [place] [address]    Report Status 08/06/2022 FINAL  Final     Total time spend on discharging this patient, including the last patient exam, discussing the hospital stay, instructions for ongoing care as it relates to all pertinent caregivers, as well as preparing the medical discharge records, prescriptions, and/or referrals as applicable, is 35 minutes.    Enzo Bi, MD  [facility] 08/10/2022, 9:03 AM

## 2022-08-27 IMAGING — US US RENAL
1 series · 14 of 25 positions shown · non-contrast
Comparison: 10/28/2021

CLINICAL DATA: Acute renal failure

EXAM:
RENAL / URINARY TRACT ULTRASOUND COMPLETE

[Series 1: us renal · 14 of 57 slices shown]
[im 1/57]
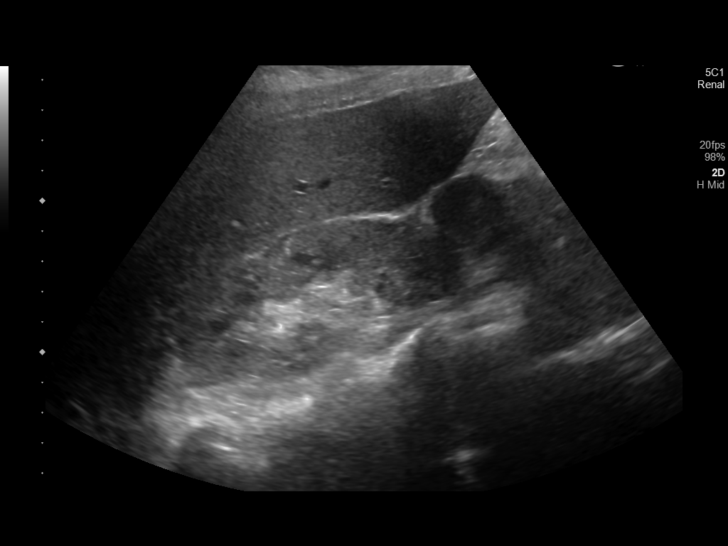
[im 5/57]
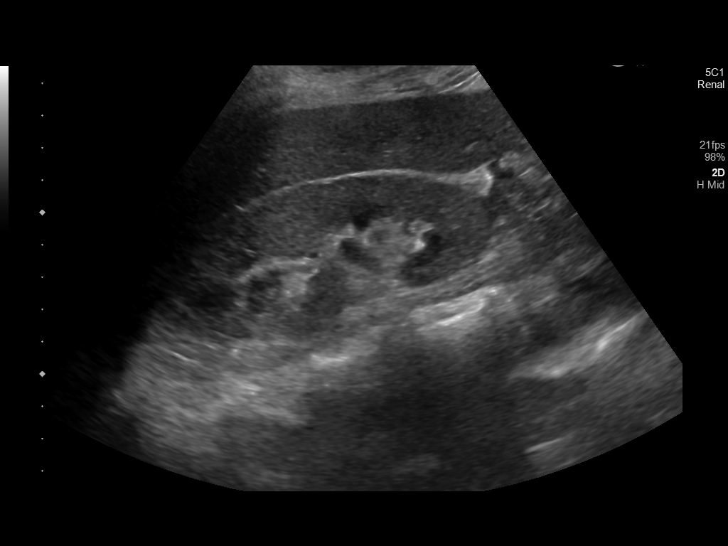
[im 10/57]
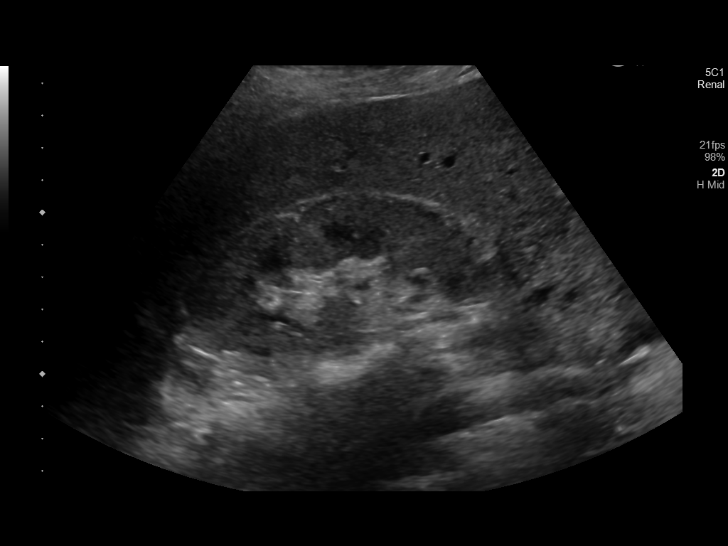
[im 15/57]
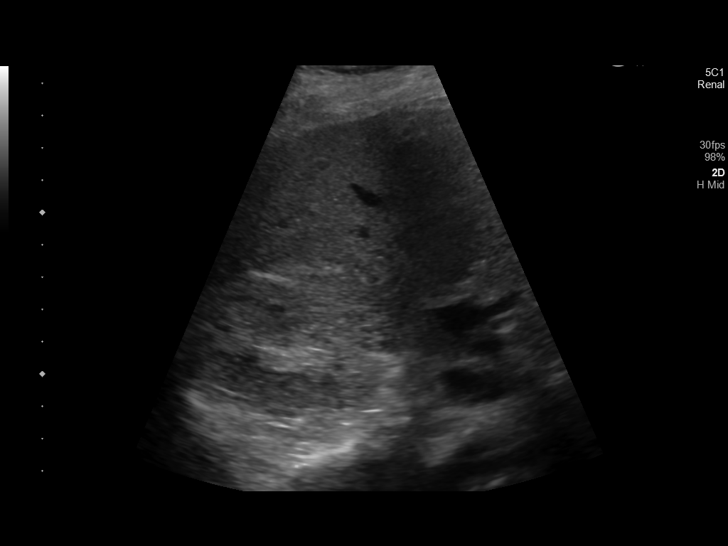
[im 19/57]
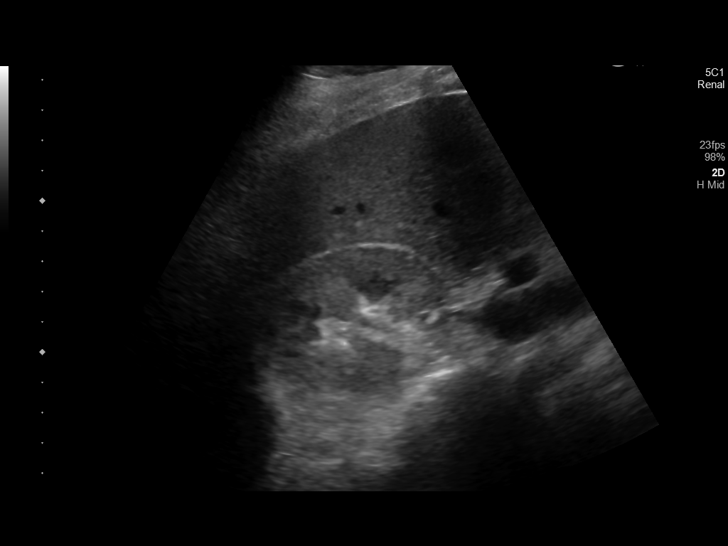
[im 22/57]
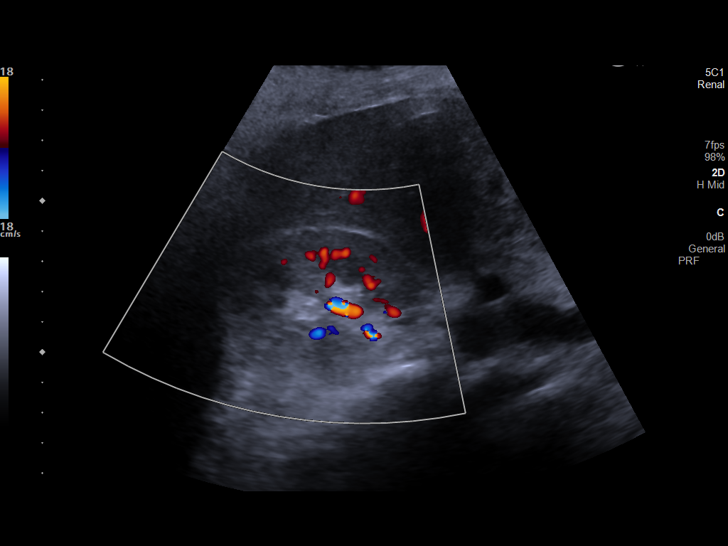
[im 26/57]
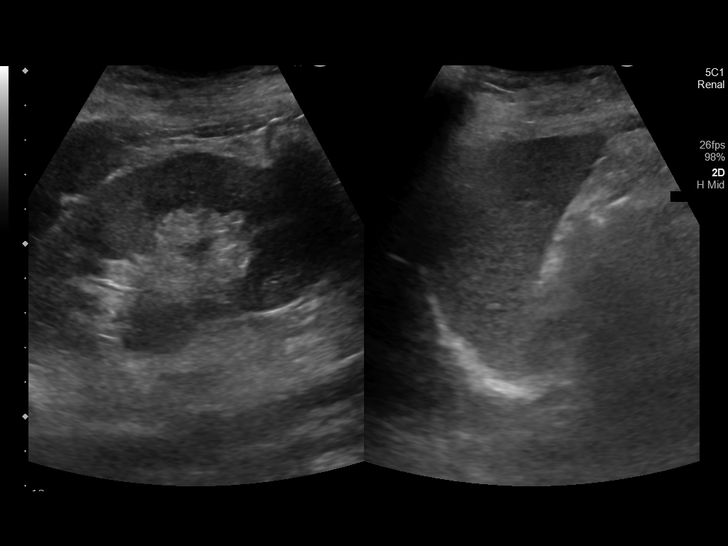
[im 31/57]
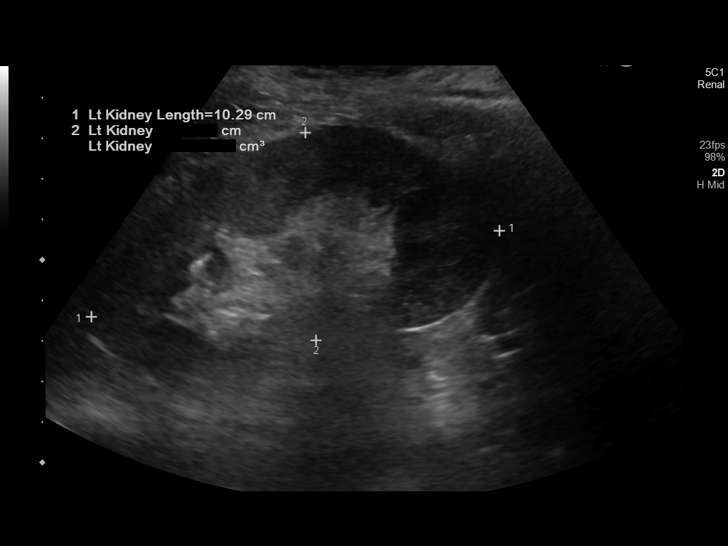
[im 36/57]
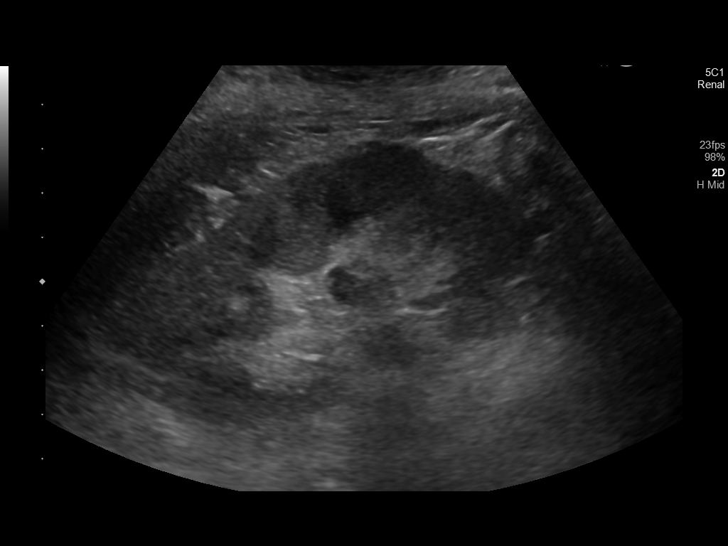
[im 38/57]
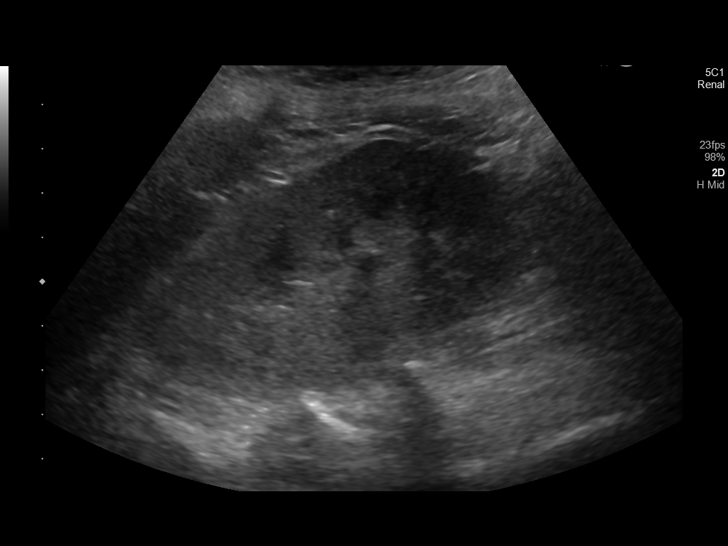
[im 43/57]
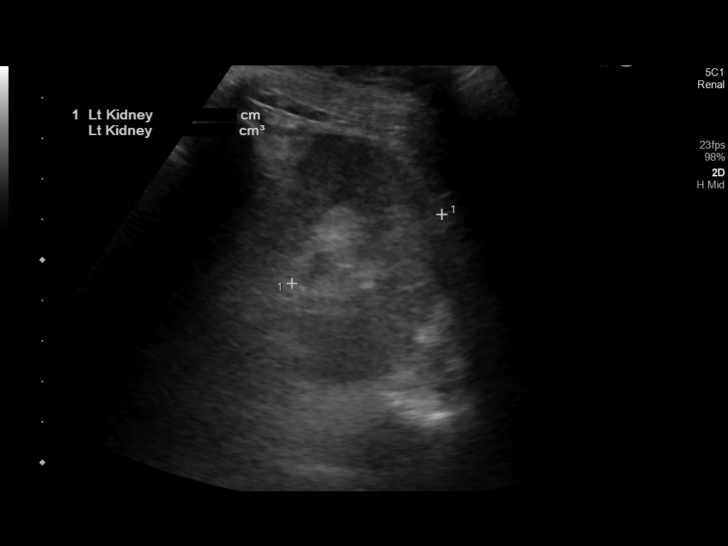
[im 47/57]
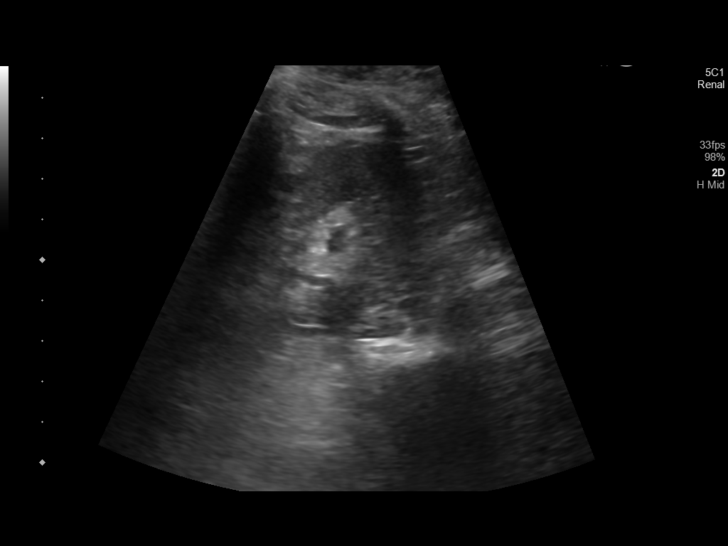
[im 52/57]
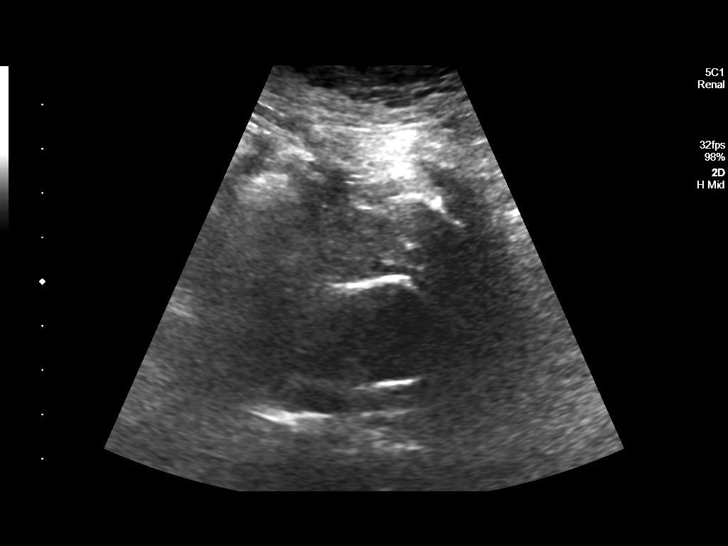
[im 57/57]
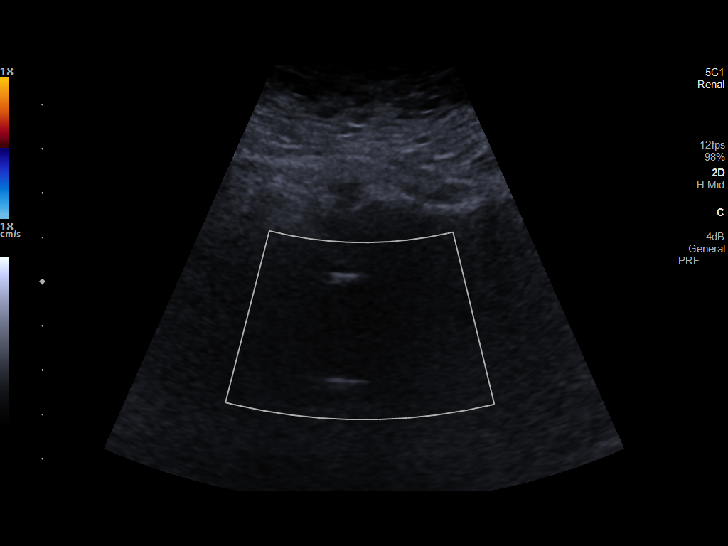

[14 of 25 positions shown; findings below may reference images not displayed]

FINDINGS: Right Kidney:

Renal measurements: 11.2 x 4.6 x 6.3 cm = volume: 169 mL. Mild
increased renal cortical echotexture. No hydronephrosis or renal
mass. No evidence of nephrolithiasis.

Left Kidney:

Renal measurements: 10.8 x 5.4 by 4.1 cm = volume: 151 ML. Mild
increased renal cortical echotexture. No hydronephrosis or renal
mass. No evidence of nephrolithiasis.

Bladder:

The bladder is decompressed, limiting its evaluation.

Other:

None.
IMPRESSION: 1. Mild bilateral increased renal cortical echotexture, consistent
with medical renal disease. Otherwise unremarkable exam.

## 2022-09-22 ENCOUNTER — Encounter: Payer: Self-pay | Admitting: Nephrology

## 2022-10-07 ENCOUNTER — Other Ambulatory Visit: Payer: Self-pay | Admitting: Nurse Practitioner

## 2022-10-07 DIAGNOSIS — Q453 Other congenital malformations of pancreas and pancreatic duct: Secondary | ICD-10-CM

## 2022-10-21 ENCOUNTER — Ambulatory Visit
Admission: RE | Admit: 2022-10-21 | Discharge: 2022-10-21 | Disposition: A | Discharge status: Home / Self Care | Payer: Medicare Other | Source: Ambulatory Visit | Attending: Nurse Practitioner | Admitting: Nurse Practitioner

## 2022-10-21 DIAGNOSIS — Q453 Other congenital malformations of pancreas and pancreatic duct: Secondary | ICD-10-CM | POA: Diagnosis not present

## 2022-12-09 ENCOUNTER — Other Ambulatory Visit: Payer: Self-pay | Admitting: Internal Medicine

## 2022-12-09 DIAGNOSIS — R110 Nausea: Secondary | ICD-10-CM

## 2022-12-14 ENCOUNTER — Ambulatory Visit: Admission: RE | Admit: 2022-12-14 | Payer: Medicare Other | Source: Ambulatory Visit | Admitting: Internal Medicine

## 2022-12-14 ENCOUNTER — Encounter: Admission: RE | Payer: Self-pay | Source: Ambulatory Visit

## 2022-12-14 SURGERY — COLONOSCOPY
Anesthesia: General

## 2022-12-15 ENCOUNTER — Encounter: Payer: Self-pay | Admitting: Internal Medicine

## 2022-12-15 ENCOUNTER — Ambulatory Visit (INDEPENDENT_AMBULATORY_CARE_PROVIDER_SITE_OTHER): Payer: Medicare Other | Admitting: Internal Medicine

## 2022-12-15 VITALS — BP 130/72 | HR 84 | Ht 62.0 in | Wt 118.8 lb

## 2022-12-15 DIAGNOSIS — I10 Essential (primary) hypertension: Secondary | ICD-10-CM

## 2022-12-15 DIAGNOSIS — R110 Nausea: Secondary | ICD-10-CM | POA: Diagnosis not present

## 2022-12-15 DIAGNOSIS — E782 Mixed hyperlipidemia: Secondary | ICD-10-CM

## 2022-12-15 DIAGNOSIS — E1165 Type 2 diabetes mellitus with hyperglycemia: Secondary | ICD-10-CM

## 2022-12-15 DIAGNOSIS — D508 Other iron deficiency anemias: Secondary | ICD-10-CM

## 2022-12-15 LAB — POCT CBG (FASTING - GLUCOSE)-MANUAL ENTRY: Glucose Fasting, POC: 61 mg/dL — AB (ref 70–99)

## 2022-12-15 MED ORDER — FERROUS SULFATE 325 (65 FE) MG PO TBEC: 325.0000 mg | DELAYED_RELEASE_TABLET | Freq: Two times a day (BID) | ORAL | 6 refills | Status: DC

## 2022-12-15 MED ORDER — METOCLOPRAMIDE HCL 5 MG PO TABS: 5.0000 mg | ORAL_TABLET | Freq: Two times a day (BID) | ORAL | 6 refills | Status: DC

## 2022-12-15 NOTE — Progress Notes (Signed)
Established Patient Office Visit  Subjective:  Patient ID: Patricia Cross, female    DOB: 12-Jan-1946  Age: 77 y.o. MRN: [id number]  Chief Complaint  Patient presents with   Follow-up    2 month follow up    Patient comes in for her follow-up today.  She is in very good spirits and states she is feeling much healthier and stronger now.  She has also gained some weight since her last visit.  Patient states that she is taking care of her health now and makes sure she eats her meals properly and on time.  Her fingerstick glucose is low right now as she was fasting for blood work but she is not due for labs for another 1 month. Patient is under care of an endocrinologist  now and maybe will get some labs over there.  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  Considering her recent episodes with poor glycemic control and dehydration I would like to wait until next lab work is done.  Patient will also ask endocrinologist when she will be ready to return to work.     Past Medical History:  Diagnosis Date   Acute renal failure (ARF) ([place])    Anemia    Diabetes mellitus without complication ([place]) [id number]   High cholesterol    Hypertension    Past Surgical History:  Procedure Laterality Date   ABDOMINAL HYSTERECTOMY  1981   BREAST BIOPSY Left 2005   neg core   BREAST BIOPSY Right 2015   neg core-Fibroadenoma   COLONOSCOPY WITH PROPOFOL N/A 01/07/2016   Procedure: COLONOSCOPY WITH PROPOFOL;  Surgeon: Hulen Luster, MD;  Location: [facility] ENDOSCOPY;  Service: Gastroenterology;  Laterality: N/A;   COLONOSCOPY WITH PROPOFOL N/A 05/09/2018   Procedure: COLONOSCOPY WITH PROPOFOL;  Surgeon: Toledo, Benay Pike, MD;  Location: [facility] ENDOSCOPY;  Service: Gastroenterology;  Laterality: N/A;   ESOPHAGOGASTRODUODENOSCOPY (EGD) WITH PROPOFOL N/A 05/09/2018   Procedure: ESOPHAGOGASTRODUODENOSCOPY (EGD) WITH PROPOFOL;  Surgeon: Toledo, Benay Pike, MD;   Location: [facility] ENDOSCOPY;  Service: Gastroenterology;  Laterality: N/A;   ESOPHAGOGASTRODUODENOSCOPY (EGD) WITH PROPOFOL N/A 08/05/2022   Procedure: ESOPHAGOGASTRODUODENOSCOPY (EGD) WITH PROPOFOL;  Surgeon: [name], MD;  Location: [facility] ENDOSCOPY;  Service: Endoscopy;  Laterality: N/A;   MICROLARYNGOSCOPY WITH CO2 LASER AND EXCISION OF VOCAL CORD LESION  1979   OOPHORECTOMY     one left, don't know which one   TUBAL LIGATION  1975     Social History   Socioeconomic History   Marital status: Widowed    Spouse name: Not on file   Number of children: Not on file   Years of education: Not on file   Highest education level: Not on file  Occupational History   Not on file  Tobacco Use   Smoking status: Never   Smokeless tobacco: Never  Vaping Use   Vaping Use: Never used  Substance and Sexual Activity   Alcohol use: No   Drug use: No   Sexual activity: Not on file  Other Topics Concern   Not on file  Social History Narrative   Not on file   Social Determinants of Health   Financial Resource Strain: Not on file  Food Insecurity: No Food Insecurity (08/05/2022)   Hunger Vital Sign    Worried About Running Out of Food in the Last Year: Never true    Ran Out of Food in the Last Year: Never true  Transportation Needs: No Transportation Needs (08/05/2022)   PRAPARE - Hydrologist (Medical): No    Lack of Transportation (Non-Medical): No  Physical Activity: Not on file  Stress: Not on file  Social Connections: Not on file  Intimate Partner Violence: Not At Risk (08/05/2022)   Humiliation, Afraid, Rape, and Kick questionnaire    Fear of Current or Ex-Partner: No    Emotionally Abused: No    Physically Abused: No    Sexually Abused: No    Family History  Problem Relation Age of Onset   Hypertension Mother    Stroke Mother    Gout Father    Colon cancer Sister    Pancreatic cancer Daughter    Breast cancer Neg Hx     No Known  Allergies  Review of Systems  Constitutional: Negative.   HENT: Negative.    Eyes: Negative.   Respiratory: Negative.    Cardiovascular: Negative.   Gastrointestinal: Negative.   Genitourinary: Negative.   Musculoskeletal: Negative.   Skin: Negative.   Neurological: Negative.   Endo/Heme/Allergies: Negative.   Psychiatric/Behavioral: Negative.         Objective:   BP 130/72   Pulse 84   Ht '5\' 2"'$  (1.575 m)   Wt 118 lb 12.8 oz (53.9 kg)   SpO2 97%   BMI 21.73 kg/m   Vitals:   12/15/22 0932  BP: 130/72  Pulse: 84  Height: '5\' 2"'$  (1.575 m)  Weight: 118 lb 12.8 oz (53.9 kg)  SpO2: 97%  BMI (Calculated): 21.72    Physical Exam Vitals and nursing note reviewed.  HENT:     Head: Normocephalic.  Cardiovascular:     Rate and Rhythm: Normal rate and regular rhythm.  Pulmonary:     Effort: Pulmonary effort is normal.     Breath sounds: Normal breath sounds.  Abdominal:     General: Abdomen is flat.     Palpations: Abdomen is soft.  Musculoskeletal:        General: Normal range of motion.     Cervical back: Normal range of motion and neck supple.  Skin:    General: Skin is warm and dry.  Neurological:     General: No focal deficit present.     Mental Status: She is alert and oriented to person, place, and time.  Psychiatric:        Mood and Affect: Mood normal.        Behavior: Behavior normal.      Results for orders placed or performed in visit on 12/15/22  POCT CBG (Fasting - Glucose)  Result Value Ref Range   Glucose Fasting, POC 61 (A) 70 - 99 mg/dL    Recent Results (from the past 2160 hour(s))  POCT CBG (Fasting - Glucose)     Status: Abnormal   Collection Time: 12/15/22  9:37 AM  Result Value Ref Range   Glucose Fasting, POC 61 (A) 70 - 99 mg/dL      Assessment & Plan:  Patient will continue her current medications.  Advised to keep her follow-up with her endocrinologist.  Will get labs done before her next visit. Problem List Items  Addressed This Visit     Hypertension   Type 2 diabetes mellitus with hyperglycemia, without long-term current use of insulin (HCC) - Primary   Relevant Orders   POCT CBG (Fasting - Glucose) (Completed)   HLD (hyperlipidemia)   Other Visit Diagnoses     Nausea  Relevant Medications   metoCLOPramide (REGLAN) 5 MG tablet       Return in about 6 weeks (around 01/26/2023).   Total time spent: 30 minutes  Perrin Maltese, MD  12/15/2022

## 2023-01-19 ENCOUNTER — Other Ambulatory Visit: Payer: Self-pay | Admitting: Internal Medicine

## 2023-01-19 DIAGNOSIS — Z1231 Encounter for screening mammogram for malignant neoplasm of breast: Secondary | ICD-10-CM

## 2023-02-01 ENCOUNTER — Other Ambulatory Visit: Payer: Medicare Other

## 2023-02-01 DIAGNOSIS — D508 Other iron deficiency anemias: Secondary | ICD-10-CM

## 2023-02-01 DIAGNOSIS — E1165 Type 2 diabetes mellitus with hyperglycemia: Secondary | ICD-10-CM

## 2023-02-01 DIAGNOSIS — E782 Mixed hyperlipidemia: Secondary | ICD-10-CM

## 2023-02-01 DIAGNOSIS — I10 Essential (primary) hypertension: Secondary | ICD-10-CM

## 2023-02-02 LAB — HEMOGLOBIN A1C
Est. average glucose Bld gHb Est-mCnc: 220 mg/dL
Hgb A1c MFr Bld: 9.3 % — ABNORMAL HIGH (ref 4.8–5.6)

## 2023-02-02 LAB — LIPID PANEL
Chol/HDL Ratio: 2.2 ratio (ref 0.0–4.4)
Cholesterol, Total: 165 mg/dL (ref 100–199)
HDL: 75 mg/dL (ref >39)
LDL Chol Calc (NIH): 79 mg/dL (ref 0–99)
Triglycerides: 51 mg/dL (ref 0–149)
VLDL Cholesterol Cal: 11 mg/dL (ref 5–40)

## 2023-02-02 LAB — CMP14+EGFR
ALT: 19 IU/L (ref 0–32)
AST: 26 IU/L (ref 0–40)
Albumin/Globulin Ratio: 1.7 (ref 1.2–2.2)
Albumin: 3.9 g/dL (ref 3.8–4.8)
Alkaline Phosphatase: 120 IU/L (ref 44–121)
BUN/Creatinine Ratio: 20 (ref 12–28)
BUN: 49 mg/dL — ABNORMAL HIGH (ref 8–27)
Bilirubin Total: 0.5 mg/dL (ref 0.0–1.2)
CO2: 23 mmol/L (ref 20–29)
Calcium: 9.4 mg/dL (ref 8.7–10.3)
Chloride: 106 mmol/L (ref 96–106)
Creatinine, Ser: 2.46 mg/dL — ABNORMAL HIGH (ref 0.57–1.00)
Globulin, Total: 2.3 g/dL (ref 1.5–4.5)
Glucose: 74 mg/dL (ref 70–99)
Potassium: 5 mmol/L (ref 3.5–5.2)
Sodium: 142 mmol/L (ref 134–144)
Total Protein: 6.2 g/dL (ref 6.0–8.5)
eGFR: 19 mL/min/{1.73_m2} — ABNORMAL LOW (ref >59)

## 2023-02-02 LAB — TSH: TSH: 0.826 u[IU]/mL (ref 0.450–4.500)

## 2023-02-03 ENCOUNTER — Ambulatory Visit (INDEPENDENT_AMBULATORY_CARE_PROVIDER_SITE_OTHER): Payer: Medicare Other | Admitting: Internal Medicine

## 2023-02-03 ENCOUNTER — Encounter: Payer: Self-pay | Admitting: Internal Medicine

## 2023-02-03 VITALS — BP 128/78 | HR 72 | Ht 62.0 in | Wt 120.6 lb

## 2023-02-03 DIAGNOSIS — D508 Other iron deficiency anemias: Secondary | ICD-10-CM | POA: Diagnosis not present

## 2023-02-03 DIAGNOSIS — E1165 Type 2 diabetes mellitus with hyperglycemia: Secondary | ICD-10-CM | POA: Diagnosis not present

## 2023-02-03 DIAGNOSIS — E782 Mixed hyperlipidemia: Secondary | ICD-10-CM | POA: Diagnosis not present

## 2023-02-03 DIAGNOSIS — K2100 Gastro-esophageal reflux disease with esophagitis, without bleeding: Secondary | ICD-10-CM

## 2023-02-03 DIAGNOSIS — I10 Essential (primary) hypertension: Secondary | ICD-10-CM

## 2023-02-03 LAB — POCT CBG (FASTING - GLUCOSE)-MANUAL ENTRY: Glucose Fasting, POC: 120 mg/dL — AB (ref 70–99)

## 2023-02-03 NOTE — Progress Notes (Signed)
Established Patient Office Visit  Subjective:  Patient ID: Patricia Cross, female    DOB: 19-Jul-1946  Age: 77 y.o. MRN: [id number]  Chief Complaint  Patient presents with   Follow-up    6 week follow up    Patient comes in for her follow-up today.  She had labs done earlier and it shows that her hemoglobin A1c has actually gone up to 9.3.  Patient admits to dietary indiscretion and she has also gained some weight.  Overall she is feeling very well, has good energy, and would like to return to work.  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 Patient is under care of nephrology as well as endocrinology.  She will be seeing them soon.  However in the meantime she can increase her basal insulin by 2 units.  Patient also encouraged to control her diet very strictly.    No other concerns at this time.   Past Medical History:  Diagnosis Date   Acute renal failure (ARF)    Anemia    Diabetes mellitus without complication 10/18/1999   High cholesterol    Hypertension     Past Surgical History:  Procedure Laterality Date   ABDOMINAL HYSTERECTOMY  1981   BREAST BIOPSY Left 2005   neg core   BREAST BIOPSY Right 2015   neg core-Fibroadenoma   COLONOSCOPY WITH PROPOFOL N/A 01/07/2016   Procedure: COLONOSCOPY WITH PROPOFOL;  Surgeon: Wallace Cullens, MD;  Location: [facility] ENDOSCOPY;  Service: Gastroenterology;  Laterality: N/A;   COLONOSCOPY WITH PROPOFOL N/A 05/09/2018   Procedure: COLONOSCOPY WITH PROPOFOL;  Surgeon: Toledo, Boykin Nearing, MD;  Location: [facility] ENDOSCOPY;  Service: Gastroenterology;  Laterality: N/A;   ESOPHAGOGASTRODUODENOSCOPY (EGD) WITH PROPOFOL N/A 05/09/2018   Procedure: ESOPHAGOGASTRODUODENOSCOPY (EGD) WITH PROPOFOL;  Surgeon: Toledo, Boykin Nearing, MD;  Location: [facility] ENDOSCOPY;  Service: Gastroenterology;  Laterality:  N/A;   ESOPHAGOGASTRODUODENOSCOPY (EGD) WITH PROPOFOL N/A 08/05/2022   Procedure: ESOPHAGOGASTRODUODENOSCOPY (EGD) WITH PROPOFOL;  Surgeon: Midge Minium, MD;  Location: [facility] ENDOSCOPY;  Service: Endoscopy;  Laterality: N/A;   MICROLARYNGOSCOPY WITH CO2 LASER AND EXCISION OF VOCAL CORD LESION  1979   OOPHORECTOMY     one left, don't know which one   TUBAL LIGATION  1975    Social History   Socioeconomic History   Marital status: Widowed    Spouse name: Not on file   Number of children: Not on file   Years of education: Not on file   Highest education level: Not on file  Occupational History   Not on file  Tobacco Use   Smoking status: Never   Smokeless tobacco: Never  Vaping Use   Vaping Use: Never used  Substance and Sexual Activity   Alcohol use: No   Drug use: No   Sexual activity: Not on file  Other Topics Concern   Not on file  Social History Narrative   Not on file   Social Determinants of Health   Financial Resource Strain: Not on file  Food Insecurity: No Food Insecurity (08/05/2022)   Hunger Vital Sign    Worried About Running Out of Food in the Last Year: Never true    Ran Out of Food in the Last Year: Never true  Transportation Needs: No Transportation Needs (08/05/2022)   PRAPARE - Transportation    Lack of Transportation (  Medical): No    Lack of Transportation (Non-Medical): No  Physical Activity: Not on file  Stress: Not on file  Social Connections: Not on file  Intimate Partner Violence: Not At Risk (08/05/2022)   Humiliation, Afraid, Rape, and Kick questionnaire    Fear of Current or Ex-Partner: No    Emotionally Abused: No    Physically Abused: No    Sexually Abused: No    Family History  Problem Relation Age of Onset   Hypertension Mother    Stroke Mother    Gout Father    Colon cancer Sister    Pancreatic cancer Daughter    Breast cancer Neg Hx     No Known Allergies  Review of Systems  Constitutional:  Negative for chills,  diaphoresis, fever, malaise/fatigue and weight loss.  HENT: Negative.    Eyes:  Negative for blurred vision, double vision, discharge and redness.  Respiratory:  Negative for cough, hemoptysis, sputum production and shortness of breath.   Cardiovascular:  Negative for chest pain, palpitations, orthopnea, claudication and PND.  Gastrointestinal:  Negative for abdominal pain, constipation, diarrhea, heartburn, melena, nausea and vomiting.  Genitourinary:  Negative for dysuria, frequency and urgency.  Musculoskeletal:  Negative for back pain, joint pain, myalgias and neck pain.  Skin:  Negative for rash.  Neurological:  Negative for dizziness, tingling, tremors, focal weakness, seizures, weakness and headaches.  Psychiatric/Behavioral:  Negative for depression and memory loss. The patient is not nervous/anxious and does not have insomnia.        Objective:   BP 128/78   Pulse 72   Ht 5\' 2"  (1.575 m)   Wt 120 lb 9.6 oz (54.7 kg)   SpO2 97%   BMI 22.06 kg/m   Vitals:   02/03/23 0913  BP: 128/78  Pulse: 72  Height: 5\' 2"  (1.575 m)  Weight: 120 lb 9.6 oz (54.7 kg)  SpO2: 97%  BMI (Calculated): 22.05    Physical Exam Vitals and nursing note reviewed.  Constitutional:      Appearance: She is normal weight.  Cardiovascular:     Rate and Rhythm: Normal rate and regular rhythm.     Pulses: Normal pulses.     Heart sounds: Normal heart sounds.  Pulmonary:     Effort: Pulmonary effort is normal.     Breath sounds: Normal breath sounds.  Abdominal:     Palpations: Abdomen is soft.  Musculoskeletal:        General: Normal range of motion.     Cervical back: Normal range of motion.  Skin:    General: Skin is warm.  Neurological:     General: No focal deficit present.     Mental Status: She is alert and oriented to person, place, and time.     Sensory: No sensory deficit.     Gait: Gait normal.     Deep Tendon Reflexes: Reflexes normal.      Results for orders placed or  performed in visit on 02/03/23  POCT CBG (Fasting - Glucose)  Result Value Ref Range   Glucose Fasting, POC 120 (A) 70 - 99 mg/dL    Recent Results (from the past 2160 hour(s))  POCT CBG (Fasting - Glucose)     Status: Abnormal   Collection Time: 12/15/22  9:37 AM  Result Value Ref Range   Glucose Fasting, POC 61 (A) 70 - 99 mg/dL  Hemoglobin W0J     Status: Abnormal   Collection Time: 02/01/23 12:07 PM  Result Value  Ref Range   Hgb A1c MFr Bld 9.3 (H) 4.8 - 5.6 %    Comment:          Prediabetes: 5.7 - 6.4          Diabetes: >6.4          Glycemic control for adults with diabetes: <7.0    Est. average glucose Bld gHb Est-mCnc 220 mg/dL  TSH     Status: None   Collection Time: 02/01/23 12:07 PM  Result Value Ref Range   TSH 0.826 0.450 - 4.500 uIU/mL  CMP14+EGFR     Status: Abnormal   Collection Time: 02/01/23 12:07 PM  Result Value Ref Range   Glucose 74 70 - 99 mg/dL   BUN 49 (H) 8 - 27 mg/dL   Creatinine, Ser 1.61 (H) 0.57 - 1.00 mg/dL   eGFR 19 (L) >09 UE/AVW/0.98   BUN/Creatinine Ratio 20 12 - 28   Sodium 142 134 - 144 mmol/L   Potassium 5.0 3.5 - 5.2 mmol/L   Chloride 106 96 - 106 mmol/L   CO2 23 20 - 29 mmol/L   Calcium 9.4 8.7 - 10.3 mg/dL   Total Protein 6.2 6.0 - 8.5 g/dL   Albumin 3.9 3.8 - 4.8 g/dL   Globulin, Total 2.3 1.5 - 4.5 g/dL   Albumin/Globulin Ratio 1.7 1.2 - 2.2   Bilirubin Total 0.5 0.0 - 1.2 mg/dL   Alkaline Phosphatase 120 44 - 121 IU/L   AST 26 0 - 40 IU/L   ALT 19 0 - 32 IU/L  Lipid panel     Status: None   Collection Time: 02/01/23 12:07 PM  Result Value Ref Range   Cholesterol, Total 165 100 - 199 mg/dL   Triglycerides 51 0 - 149 mg/dL   HDL 75 >11 mg/dL   VLDL Cholesterol Cal 11 5 - 40 mg/dL   LDL Chol Calc (NIH) 79 0 - 99 mg/dL   Chol/HDL Ratio 2.2 0.0 - 4.4 ratio    Comment:                                   T. Chol/HDL Ratio                                             Men  Women                               1/2 Avg.Risk  3.4     3.3                                   Avg.Risk  5.0    4.4                                2X Avg.Risk  9.6    7.1                                3X Avg.Risk 23.4   11.0   POCT CBG (Fasting - Glucose)     Status: Abnormal  Collection Time: 02/03/23  9:17 AM  Result Value Ref Range   Glucose Fasting, POC 120 (A) 70 - 99 mg/dL      Assessment & Plan:  Patient will continue all her medications as such.  Encouraged to improve her dietary control.  Also to increase her basal insulin by 2 units.  Patient will further discuss with her endocrinologist about her regimen. Problem List Items Addressed This Visit     Essential hypertension, benign   Type 2 diabetes mellitus with hyperglycemia, without long-term current use of insulin - Primary   Relevant Orders   POCT CBG (Fasting - Glucose) (Completed)   HLD (hyperlipidemia)   Absolute anemia   Gastroesophageal reflux disease with esophagitis without hemorrhage    Follow up 1 month.   Total time spent: 30 minutes  Margaretann Loveless, MD  02/03/2023

## 2023-02-08 ENCOUNTER — Ambulatory Visit
Admission: RE | Admit: 2023-02-08 | Discharge: 2023-02-08 | Disposition: A | Discharge status: Home / Self Care | Payer: Medicare Other | Source: Ambulatory Visit | Attending: Internal Medicine | Admitting: Internal Medicine

## 2023-02-08 DIAGNOSIS — Z1231 Encounter for screening mammogram for malignant neoplasm of breast: Secondary | ICD-10-CM | POA: Diagnosis not present

## 2023-03-06 ENCOUNTER — Ambulatory Visit (INDEPENDENT_AMBULATORY_CARE_PROVIDER_SITE_OTHER): Payer: Medicare Other | Admitting: Internal Medicine

## 2023-03-06 ENCOUNTER — Encounter: Payer: Self-pay | Admitting: Internal Medicine

## 2023-03-06 VITALS — BP 120/64 | HR 70 | Ht 62.0 in | Wt 120.0 lb

## 2023-03-06 DIAGNOSIS — E1165 Type 2 diabetes mellitus with hyperglycemia: Secondary | ICD-10-CM | POA: Diagnosis not present

## 2023-03-06 DIAGNOSIS — E782 Mixed hyperlipidemia: Secondary | ICD-10-CM | POA: Diagnosis not present

## 2023-03-06 DIAGNOSIS — I10 Essential (primary) hypertension: Secondary | ICD-10-CM | POA: Diagnosis not present

## 2023-03-06 DIAGNOSIS — K2100 Gastro-esophageal reflux disease with esophagitis, without bleeding: Secondary | ICD-10-CM

## 2023-03-06 DIAGNOSIS — N1832 Chronic kidney disease, stage 3b: Secondary | ICD-10-CM

## 2023-03-06 LAB — POCT CBG (FASTING - GLUCOSE)-MANUAL ENTRY: Glucose Fasting, POC: 66 mg/dL — AB (ref 70–99)

## 2023-03-06 NOTE — Progress Notes (Signed)
Established Patient Office Visit  Subjective:  Patient ID: Patricia Cross, female    DOB: [birth year]  Age: 77 y.o. MRN: [id number]  Chief Complaint  Patient presents with   Follow-up    1 month follow up    Patient comes in for her follow-up.  She is feeling quite well and has returned to work.  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  Her blood sugar is low right now but she had skipped breakfast.  Overall her numbers are looking good at home.  She has an appointment to see her endocrine and nephrologist next month.    No other concerns at this time.   Past Medical History:  Diagnosis Date   Acute renal failure (ARF) (HCC)    Anemia    Diabetes mellitus without complication (HCC) 10/18/1999   High cholesterol    Hypertension     Past Surgical History:  Procedure Laterality Date   ABDOMINAL HYSTERECTOMY  1981   BREAST BIOPSY Left 2005   neg core   BREAST BIOPSY Right 2015   neg core-Fibroadenoma   COLONOSCOPY WITH PROPOFOL N/A 01/07/2016   Procedure: COLONOSCOPY WITH PROPOFOL;  Surgeon: Wallace Cullens, MD;  Location: [facility] ENDOSCOPY;  Service: Gastroenterology;  Laterality: N/A;   COLONOSCOPY WITH PROPOFOL N/A 05/09/2018   Procedure: COLONOSCOPY WITH PROPOFOL;  Surgeon: Toledo, Boykin Nearing, MD;  Location: [facility] ENDOSCOPY;  Service: Gastroenterology;  Laterality: N/A;   ESOPHAGOGASTRODUODENOSCOPY (EGD) WITH PROPOFOL N/A 05/09/2018   Procedure: ESOPHAGOGASTRODUODENOSCOPY (EGD) WITH PROPOFOL;  Surgeon: Toledo, Boykin Nearing, MD;  Location: [facility] ENDOSCOPY;  Service: Gastroenterology;  Laterality: N/A;   ESOPHAGOGASTRODUODENOSCOPY (EGD) WITH PROPOFOL N/A 08/05/2022   Procedure: ESOPHAGOGASTRODUODENOSCOPY (EGD) WITH PROPOFOL;  Surgeon: Midge Minium, MD;  Location: [facility] ENDOSCOPY;  Service: Endoscopy;  Laterality: N/A;   MICROLARYNGOSCOPY WITH CO2 LASER AND EXCISION OF VOCAL CORD LESION  1979   OOPHORECTOMY     one left, don't know  which one   TUBAL LIGATION  1975    Social History   Socioeconomic History   Marital status: Widowed    Spouse name: Not on file   Number of children: Not on file   Years of education: Not on file   Highest education level: Not on file  Occupational History   Not on file  Tobacco Use   Smoking status: Never   Smokeless tobacco: Never  Vaping Use   Vaping Use: Never used  Substance and Sexual Activity   Alcohol use: No   Drug use: No   Sexual activity: Not on file  Other Topics Concern   Not on file  Social History Narrative   Not on file   Social Determinants of Health   Financial Resource Strain: Not on file  Food Insecurity: No Food Insecurity (08/05/2022)   Hunger Vital Sign    Worried About Running Out of Food in the Last Year: Never true    Ran Out of Food in the Last Year: Never true  Transportation Needs: No Transportation Needs (08/05/2022)   PRAPARE - Administrator, Civil Service (Medical): No    Lack of Transportation (Non-Medical): No  Physical Activity: Not on file  Stress: Not on file  Social Connections: Not on file  Intimate Partner Violence: Not At Risk (08/05/2022)   Humiliation, Afraid, Rape, and Kick questionnaire    Fear of Current or Ex-Partner: No    Emotionally Abused: No  Physically Abused: No    Sexually Abused: No    Family History  Problem Relation Age of Onset   Hypertension Mother    Stroke Mother    Gout Father    Colon cancer Sister    Pancreatic cancer Daughter    Breast cancer Neg Hx     No Known Allergies  Review of Systems  Constitutional:  Negative for chills, diaphoresis, fever, malaise/fatigue and weight loss.  HENT: Negative.    Eyes: Negative.   Respiratory:  Negative for cough, shortness of breath and wheezing.   Cardiovascular:  Negative for chest pain, palpitations, leg swelling and PND.  Gastrointestinal:  Negative for abdominal pain, blood in stool, constipation, diarrhea, heartburn, nausea  and vomiting.  Genitourinary:  Negative for dysuria, flank pain, frequency and urgency.  Musculoskeletal:  Negative for back pain, falls, myalgias and neck pain.  Neurological:  Negative for dizziness, tingling, sensory change, seizures, weakness and headaches.  Psychiatric/Behavioral:  Negative for depression and hallucinations. The patient is not nervous/anxious.        Objective:   BP 120/64   Pulse 70   Ht 5\' 2"  (1.575 m)   Wt 120 lb (54.4 kg)   SpO2 97%   BMI 21.95 kg/m   Vitals:   03/06/23 1119  BP: 120/64  Pulse: 70  Height: 5\' 2"  (1.575 m)  Weight: 120 lb (54.4 kg)  SpO2: 97%  BMI (Calculated): 21.94    Physical Exam Vitals and nursing note reviewed.  Constitutional:      Appearance: Normal appearance. She is normal weight.  Cardiovascular:     Rate and Rhythm: Normal rate and regular rhythm.     Pulses: Normal pulses.     Heart sounds: Normal heart sounds. No murmur heard. Pulmonary:     Effort: Pulmonary effort is normal.     Breath sounds: Normal breath sounds. No wheezing, rhonchi or rales.  Abdominal:     General: Bowel sounds are normal. There is no distension.     Palpations: Abdomen is soft.     Tenderness: There is no right CVA tenderness, left CVA tenderness or guarding.  Musculoskeletal:        General: No swelling or signs of injury. Normal range of motion.     Cervical back: Normal range of motion and neck supple.     Right lower leg: No edema.     Left lower leg: No edema.  Skin:    General: Skin is warm.  Neurological:     General: No focal deficit present.     Mental Status: She is alert and oriented to person, place, and time.  Psychiatric:        Mood and Affect: Mood normal.        Behavior: Behavior normal.      Results for orders placed or performed in visit on 03/06/23  POCT CBG (Fasting - Glucose)  Result Value Ref Range   Glucose Fasting, POC 66 (A) 70 - 99 mg/dL        Assessment & Plan:  Patient advised to  continue taking her medications.  She is to  monitor her blood sugars at home. Problem List Items Addressed This Visit     Essential hypertension, benign   Type 2 diabetes mellitus with hyperglycemia, without long-term current use of insulin (HCC) - Primary   Relevant Orders   POCT CBG (Fasting - Glucose) (Completed)   HLD (hyperlipidemia)   Gastroesophageal reflux disease with esophagitis without hemorrhage  Stage 3b chronic kidney disease (HCC)    Return in about 3 months (around 06/06/2023).   Total time spent: 25 minutes  Margaretann Loveless, MD  03/06/2023   This document may have been prepared by [facility] Voice Recognition software and as such may include unintentional dictation errors.

## 2023-05-02 ENCOUNTER — Other Ambulatory Visit: Payer: Self-pay | Admitting: Internal Medicine

## 2023-05-02 DIAGNOSIS — E782 Mixed hyperlipidemia: Secondary | ICD-10-CM

## 2023-06-02 ENCOUNTER — Other Ambulatory Visit: Payer: Self-pay

## 2023-06-02 ENCOUNTER — Other Ambulatory Visit: Payer: Medicare Other

## 2023-06-02 DIAGNOSIS — D508 Other iron deficiency anemias: Secondary | ICD-10-CM

## 2023-06-02 DIAGNOSIS — I10 Essential (primary) hypertension: Secondary | ICD-10-CM

## 2023-06-02 DIAGNOSIS — E782 Mixed hyperlipidemia: Secondary | ICD-10-CM

## 2023-06-02 DIAGNOSIS — E1165 Type 2 diabetes mellitus with hyperglycemia: Secondary | ICD-10-CM

## 2023-06-03 LAB — CMP14+EGFR
ALT: 19 IU/L (ref 0–32)
AST: 22 IU/L (ref 0–40)
Albumin: 4 g/dL (ref 3.8–4.8)
Alkaline Phosphatase: 121 IU/L (ref 44–121)
BUN/Creatinine Ratio: 21 (ref 12–28)
BUN: 49 mg/dL — ABNORMAL HIGH (ref 8–27)
Bilirubin Total: 0.4 mg/dL (ref 0.0–1.2)
CO2: 24 mmol/L (ref 20–29)
Calcium: 9.3 mg/dL (ref 8.7–10.3)
Chloride: 107 mmol/L — ABNORMAL HIGH (ref 96–106)
Creatinine, Ser: 2.32 mg/dL — ABNORMAL HIGH (ref 0.57–1.00)
Globulin, Total: 2.5 g/dL (ref 1.5–4.5)
Glucose: 84 mg/dL (ref 70–99)
Potassium: 5.2 mmol/L (ref 3.5–5.2)
Sodium: 144 mmol/L (ref 134–144)
Total Protein: 6.5 g/dL (ref 6.0–8.5)
eGFR: 21 mL/min/{1.73_m2} — ABNORMAL LOW (ref >59)

## 2023-06-03 LAB — LIPID PANEL W/O CHOL/HDL RATIO
Cholesterol, Total: 167 mg/dL (ref 100–199)
HDL: 59 mg/dL (ref >39)
LDL Chol Calc (NIH): 96 mg/dL (ref 0–99)
Triglycerides: 63 mg/dL (ref 0–149)
VLDL Cholesterol Cal: 12 mg/dL (ref 5–40)

## 2023-06-03 LAB — CBC WITH DIFFERENTIAL/PLATELET
Basophils Absolute: 0.1 10*3/uL (ref 0.0–0.2)
Basos: 1 %
EOS (ABSOLUTE): 0.3 10*3/uL (ref 0.0–0.4)
Eos: 4 %
Hematocrit: 34.9 % (ref 34.0–46.6)
Hemoglobin: 11.5 g/dL (ref 11.1–15.9)
Immature Grans (Abs): 0 10*3/uL (ref 0.0–0.1)
Immature Granulocytes: 0 %
Lymphocytes Absolute: 1.4 10*3/uL (ref 0.7–3.1)
Lymphs: 21 %
MCH: 31.3 pg (ref 26.6–33.0)
MCHC: 33 g/dL (ref 31.5–35.7)
MCV: 95 fL (ref 79–97)
Monocytes Absolute: 0.7 10*3/uL (ref 0.1–0.9)
Monocytes: 10 %
Neutrophils Absolute: 4.5 10*3/uL (ref 1.4–7.0)
Neutrophils: 64 %
Platelets: 250 10*3/uL (ref 150–450)
RBC: 3.68 x10E6/uL — ABNORMAL LOW (ref 3.77–5.28)
RDW: 13.9 % (ref 11.7–15.4)
WBC: 6.9 10*3/uL (ref 3.4–10.8)

## 2023-06-03 LAB — HEMOGLOBIN A1C
Est. average glucose Bld gHb Est-mCnc: 203 mg/dL
Hgb A1c MFr Bld: 8.7 % — ABNORMAL HIGH (ref 4.8–5.6)

## 2023-06-06 ENCOUNTER — Ambulatory Visit (INDEPENDENT_AMBULATORY_CARE_PROVIDER_SITE_OTHER): Payer: Medicare Other | Admitting: Internal Medicine

## 2023-06-06 ENCOUNTER — Encounter: Payer: Self-pay | Admitting: Internal Medicine

## 2023-06-06 VITALS — BP 150/70 | HR 69 | Ht 63.0 in | Wt 128.0 lb

## 2023-06-06 DIAGNOSIS — E782 Mixed hyperlipidemia: Secondary | ICD-10-CM | POA: Diagnosis not present

## 2023-06-06 DIAGNOSIS — D508 Other iron deficiency anemias: Secondary | ICD-10-CM

## 2023-06-06 DIAGNOSIS — E1165 Type 2 diabetes mellitus with hyperglycemia: Secondary | ICD-10-CM | POA: Diagnosis not present

## 2023-06-06 DIAGNOSIS — I10 Essential (primary) hypertension: Secondary | ICD-10-CM | POA: Diagnosis not present

## 2023-06-06 LAB — POCT CBG (FASTING - GLUCOSE)-MANUAL ENTRY: Glucose Fasting, POC: 280 mg/dL — AB (ref 70–99)

## 2023-06-06 MED ORDER — GLIPIZIDE 5 MG PO TABS
2.5000 mg | ORAL_TABLET | Freq: Two times a day (BID) | ORAL | 2 refills | Status: DC
Start: 2023-06-06 — End: 2023-07-11

## 2023-06-06 MED ORDER — LANTUS SOLOSTAR 100 UNIT/ML ~~LOC~~ SOPN
24.0000 [IU] | PEN_INJECTOR | Freq: Every day | SUBCUTANEOUS | 2 refills | Status: DC
Start: 2023-06-06 — End: 2023-09-08

## 2023-06-06 NOTE — Progress Notes (Signed)
Established Patient Office Visit  Subjective:  Patient ID: Patricia Cross, female    DOB: [birth year]  Age: 77 y.o. MRN: [id number]  Chief Complaint  Patient presents with   Follow-up    3 month follow up    Patient comes in for her follow-up.  She is feeling well and is eating much better than before.  Has gained weight and her blood pressure is also higher.  Currently she is only on Losartan 25 mg/day, will increased to 50 mg. Her recent hemoglobin A1c has come down to 8.7 from 9.3.  Her current Lantus dose is 22 units/day, will increase to 24 to 25 units/day. Patient will continue to monitor her fingerstick at home and will report to [place]. Otherwise feels very well.    No other concerns at this time.   Past Medical History:  Diagnosis Date   Acute renal failure (ARF) (HCC)    Anemia    Diabetes mellitus without complication (HCC) 10/18/1999   High cholesterol    Hypertension     Past Surgical History:  Procedure Laterality Date   ABDOMINAL HYSTERECTOMY  1981   BREAST BIOPSY Left 2005   neg core   BREAST BIOPSY Right 2015   neg core-Fibroadenoma   COLONOSCOPY WITH PROPOFOL N/A 01/07/2016   Procedure: COLONOSCOPY WITH PROPOFOL;  Surgeon: Wallace Cullens, MD;  Location: [facility] ENDOSCOPY;  Service: Gastroenterology;  Laterality: N/A;   COLONOSCOPY WITH PROPOFOL N/A 05/09/2018   Procedure: COLONOSCOPY WITH PROPOFOL;  Surgeon: Toledo, Boykin Nearing, MD;  Location: [facility] ENDOSCOPY;  Service: Gastroenterology;  Laterality: N/A;   ESOPHAGOGASTRODUODENOSCOPY (EGD) WITH PROPOFOL N/A 05/09/2018   Procedure: ESOPHAGOGASTRODUODENOSCOPY (EGD) WITH PROPOFOL;  Surgeon: Toledo, Boykin Nearing, MD;  Location: [facility] ENDOSCOPY;  Service: Gastroenterology;  Laterality: N/A;   ESOPHAGOGASTRODUODENOSCOPY (EGD) WITH PROPOFOL N/A 08/05/2022   Procedure: ESOPHAGOGASTRODUODENOSCOPY (EGD) WITH PROPOFOL;  Surgeon: Midge Minium, MD;  Location: [facility] ENDOSCOPY;  Service: Endoscopy;  Laterality: N/A;   MICROLARYNGOSCOPY  WITH CO2 LASER AND EXCISION OF VOCAL CORD LESION  1979   OOPHORECTOMY     one left, don't know which one   TUBAL LIGATION  1975    Social History   Socioeconomic History   Marital status: Widowed    Spouse name: Not on file   Number of children: Not on file   Years of education: Not on file   Highest education level: Not on file  Occupational History   Not on file  Tobacco Use   Smoking status: Never   Smokeless tobacco: Never  Vaping Use   Vaping status: Never Used  Substance and Sexual Activity   Alcohol use: No   Drug use: No   Sexual activity: Not on file  Other Topics Concern   Not on file  Social History Narrative   Not on file   Social Determinants of Health   Financial Resource Strain: Not on file  Food Insecurity: No Food Insecurity (08/05/2022)   Hunger Vital Sign    Worried About Running Out of Food in the Last Year: Never true    Ran Out of Food in the Last Year: Never true  Transportation Needs: No Transportation Needs (08/05/2022)   PRAPARE - Administrator, Civil Service (Medical): No    Lack of Transportation (Non-Medical): No  Physical Activity: Not on file  Stress: Not on file  Social Connections: Not on file  Intimate Partner Violence: Not At Risk (08/05/2022)   Humiliation, Afraid, Rape, and Kick questionnaire  Fear of Current or Ex-Partner: No    Emotionally Abused: No    Physically Abused: No    Sexually Abused: No    Family History  Problem Relation Age of Onset   Hypertension Mother    Stroke Mother    Gout Father    Colon cancer Sister    Pancreatic cancer Daughter    Breast cancer Neg Hx     No Known Allergies  Review of Systems  Constitutional: Negative.  Negative for chills, fever and malaise/fatigue.  HENT: Negative.    Eyes: Negative.   Respiratory: Negative.  Negative for cough and shortness of breath.   Cardiovascular: Negative.  Negative for chest pain, palpitations and leg swelling.  Gastrointestinal:  Negative.  Negative for abdominal pain, constipation, diarrhea, heartburn, nausea and vomiting.  Genitourinary: Negative.  Negative for dysuria and flank pain.  Musculoskeletal: Negative.  Negative for joint pain and myalgias.  Skin: Negative.   Neurological: Negative.  Negative for dizziness and headaches.  Endo/Heme/Allergies: Negative.   Psychiatric/Behavioral: Negative.  Negative for depression and suicidal ideas. The patient is not nervous/anxious.        Objective:   BP (!) 150/70   Pulse 69   Ht 5\' 3"  (1.6 m)   Wt 128 lb (58.1 kg)   SpO2 96%   BMI 22.67 kg/m   Vitals:   06/06/23 1033  BP: (!) 150/70  Pulse: 69  Height: 5\' 3"  (1.6 m)  Weight: 128 lb (58.1 kg)  SpO2: 96%  BMI (Calculated): 22.68    Physical Exam Vitals and nursing note reviewed.  Constitutional:      Appearance: Normal appearance.  HENT:     Head: Normocephalic and atraumatic.     Nose: Nose normal.     Mouth/Throat:     Mouth: Mucous membranes are moist.     Pharynx: Oropharynx is clear.  Eyes:     Conjunctiva/sclera: Conjunctivae normal.     Pupils: Pupils are equal, round, and reactive to light.  Cardiovascular:     Rate and Rhythm: Normal rate and regular rhythm.     Pulses: Normal pulses.     Heart sounds: Normal heart sounds. No murmur heard. Pulmonary:     Effort: Pulmonary effort is normal.     Breath sounds: Normal breath sounds. No wheezing.  Abdominal:     General: Bowel sounds are normal.     Palpations: Abdomen is soft.     Tenderness: There is no abdominal tenderness. There is no right CVA tenderness or left CVA tenderness.  Musculoskeletal:        General: Normal range of motion.     Cervical back: Normal range of motion.     Right lower leg: No edema.     Left lower leg: No edema.  Skin:    General: Skin is warm and dry.  Neurological:     General: No focal deficit present.     Mental Status: She is alert and oriented to person, place, and time.  Psychiatric:         Mood and Affect: Mood normal.        Behavior: Behavior normal.      Results for orders placed or performed in visit on 06/06/23  POCT CBG (Fasting - Glucose)  Result Value Ref Range   Glucose Fasting, POC 280 (A) 70 - 99 mg/dL    Recent Results (from the past 2160 hour(s))  Hemoglobin A1c     Status: Abnormal   Collection  Time: 06/02/23 12:09 PM  Result Value Ref Range   Hgb A1c MFr Bld 8.7 (H) 4.8 - 5.6 %    Comment:          Prediabetes: 5.7 - 6.4          Diabetes: >6.4          Glycemic control for adults with diabetes: <7.0    Est. average glucose Bld gHb Est-mCnc 203 mg/dL  Lipid Panel w/o Chol/HDL Ratio     Status: None   Collection Time: 06/02/23 12:09 PM  Result Value Ref Range   Cholesterol, Total 167 100 - 199 mg/dL   Triglycerides 63 0 - 149 mg/dL   HDL 59 >29 mg/dL   VLDL Cholesterol Cal 12 5 - 40 mg/dL   LDL Chol Calc (NIH) 96 0 - 99 mg/dL  FAO13+YQMV     Status: Abnormal   Collection Time: 06/02/23 12:09 PM  Result Value Ref Range   Glucose 84 70 - 99 mg/dL   BUN 49 (H) 8 - 27 mg/dL   Creatinine, Ser 7.84 (H) 0.57 - 1.00 mg/dL   eGFR 21 (L) >69 GE/XBM/8.41   BUN/Creatinine Ratio 21 12 - 28   Sodium 144 134 - 144 mmol/L   Potassium 5.2 3.5 - 5.2 mmol/L   Chloride 107 (H) 96 - 106 mmol/L   CO2 24 20 - 29 mmol/L   Calcium 9.3 8.7 - 10.3 mg/dL   Total Protein 6.5 6.0 - 8.5 g/dL   Albumin 4.0 3.8 - 4.8 g/dL   Globulin, Total 2.5 1.5 - 4.5 g/dL   Bilirubin Total 0.4 0.0 - 1.2 mg/dL   Alkaline Phosphatase 121 44 - 121 IU/L   AST 22 0 - 40 IU/L   ALT 19 0 - 32 IU/L  CBC with Differential/Platelet     Status: Abnormal   Collection Time: 06/02/23 12:09 PM  Result Value Ref Range   WBC 6.9 3.4 - 10.8 x10E3/uL   RBC 3.68 (L) 3.77 - 5.28 x10E6/uL   Hemoglobin 11.5 11.1 - 15.9 g/dL   Hematocrit 32.4 40.1 - 46.6 %   MCV 95 79 - 97 fL   MCH 31.3 26.6 - 33.0 pg   MCHC 33.0 31.5 - 35.7 g/dL   RDW 02.7 25.3 - 66.4 %   Platelets 250 150 - 450 x10E3/uL    Neutrophils 64 Not Estab. %   Lymphs 21 Not Estab. %   Monocytes 10 Not Estab. %   Eos 4 Not Estab. %   Basos 1 Not Estab. %   Neutrophils Absolute 4.5 1.4 - 7.0 x10E3/uL   Lymphocytes Absolute 1.4 0.7 - 3.1 x10E3/uL   Monocytes Absolute 0.7 0.1 - 0.9 x10E3/uL   EOS (ABSOLUTE) 0.3 0.0 - 0.4 x10E3/uL   Basophils Absolute 0.1 0.0 - 0.2 x10E3/uL   Immature Granulocytes 0 Not Estab. %   Immature Grans (Abs) 0.0 0.0 - 0.1 x10E3/uL  POCT CBG (Fasting - Glucose)     Status: Abnormal   Collection Time: 06/06/23 10:39 AM  Result Value Ref Range   Glucose Fasting, POC 280 (A) 70 - 99 mg/dL      Assessment & Plan:  Increase dose of losartan to 50 mg/day.  Recheck her BUN and creatinine at follow-up. Also to increase her dose of Lantus to 24 units/day. Problem List Items Addressed This Visit     Essential hypertension, benign   Relevant Medications   losartan (COZAAR) 25 MG tablet   Type 2 diabetes mellitus  with hyperglycemia, without long-term current use of insulin (HCC) - Primary   Relevant Medications   FARXIGA 10 MG TABS tablet   losartan (COZAAR) 25 MG tablet   insulin glargine (LANTUS SOLOSTAR) 100 UNIT/ML Solostar Pen   glipiZIDE (GLUCOTROL) 5 MG tablet   Other Relevant Orders   POCT CBG (Fasting - Glucose) (Completed)   HLD (hyperlipidemia)   Relevant Medications   losartan (COZAAR) 25 MG tablet   Absolute anemia    Return in about 2 weeks (around 06/20/2023).   Total time spent: 30 minutes  Margaretann Loveless, MD  06/06/2023   This document may have been prepared by [facility] Voice Recognition software and as such may include unintentional dictation errors.

## 2023-06-20 ENCOUNTER — Ambulatory Visit (INDEPENDENT_AMBULATORY_CARE_PROVIDER_SITE_OTHER): Payer: Medicare Other | Admitting: Internal Medicine

## 2023-06-20 ENCOUNTER — Encounter: Payer: Self-pay | Admitting: Internal Medicine

## 2023-06-20 VITALS — BP 162/86 | HR 59 | Ht 63.0 in | Wt 129.8 lb

## 2023-06-20 DIAGNOSIS — E1165 Type 2 diabetes mellitus with hyperglycemia: Secondary | ICD-10-CM

## 2023-06-20 DIAGNOSIS — K2100 Gastro-esophageal reflux disease with esophagitis, without bleeding: Secondary | ICD-10-CM

## 2023-06-20 DIAGNOSIS — E782 Mixed hyperlipidemia: Secondary | ICD-10-CM

## 2023-06-20 DIAGNOSIS — I10 Essential (primary) hypertension: Secondary | ICD-10-CM | POA: Diagnosis not present

## 2023-06-20 DIAGNOSIS — N1832 Chronic kidney disease, stage 3b: Secondary | ICD-10-CM

## 2023-06-20 LAB — POCT CBG (FASTING - GLUCOSE)-MANUAL ENTRY: Glucose Fasting, POC: 132 mg/dL — AB (ref 70–99)

## 2023-06-20 MED ORDER — HYDRALAZINE HCL 25 MG PO TABS: 25.0000 mg | ORAL_TABLET | Freq: Two times a day (BID) | ORAL | 2 refills | Status: DC

## 2023-06-20 NOTE — Progress Notes (Signed)
Established Patient Office Visit  Subjective:  Patient ID: Patricia Cross, female    DOB: Nov 11, 1945  Age: 77 y.o. MRN: [id number]  Chief Complaint  Patient presents with   Follow-up    2 week follow up    Patient comes in for her follow-up of blood pressure.  At last visit the blood pressure was high as she had not taken her medications at that time.  Today it is still elevated.  She is now eating more and has gained a few pounds as well.  Patient is currently on Norvasc 10 mg and losartan 25 mg.  Her creatinine remains high. Will add hydralazine 25 mg twice a day, and will increase as tolerated. Patient however feels well, and her fingerstick glucose is much better today since her Lantus dose was increased to 24 units.    No other concerns at this time.   Past Medical History:  Diagnosis Date   Acute renal failure (ARF) (HCC)    Anemia    Diabetes mellitus without complication (HCC) 10/18/1999   High cholesterol    Hypertension     Past Surgical History:  Procedure Laterality Date   ABDOMINAL HYSTERECTOMY  1981   BREAST BIOPSY Left 2005   neg core   BREAST BIOPSY Right 2015   neg core-Fibroadenoma   COLONOSCOPY WITH PROPOFOL N/A 01/07/2016   Procedure: COLONOSCOPY WITH PROPOFOL;  Surgeon: Wallace Cullens, MD;  Location: [facility] ENDOSCOPY;  Service: Gastroenterology;  Laterality: N/A;   COLONOSCOPY WITH PROPOFOL N/A 05/09/2018   Procedure: COLONOSCOPY WITH PROPOFOL;  Surgeon: Toledo, Boykin Nearing, MD;  Location: [facility] ENDOSCOPY;  Service: Gastroenterology;  Laterality: N/A;   ESOPHAGOGASTRODUODENOSCOPY (EGD) WITH PROPOFOL N/A 05/09/2018   Procedure: ESOPHAGOGASTRODUODENOSCOPY (EGD) WITH PROPOFOL;  Surgeon: Toledo, Boykin Nearing, MD;  Location: [facility] ENDOSCOPY;  Service: Gastroenterology;  Laterality: N/A;   ESOPHAGOGASTRODUODENOSCOPY (EGD) WITH PROPOFOL N/A 08/05/2022   Procedure: ESOPHAGOGASTRODUODENOSCOPY (EGD) WITH PROPOFOL;  Surgeon: Midge Minium, MD;  Location: [facility] ENDOSCOPY;   Service: Endoscopy;  Laterality: N/A;   MICROLARYNGOSCOPY WITH CO2 LASER AND EXCISION OF VOCAL CORD LESION  1979   OOPHORECTOMY     one left, don't know which one   TUBAL LIGATION  1975    Social History   Socioeconomic History   Marital status: Widowed    Spouse name: Not on file   Number of children: Not on file   Years of education: Not on file   Highest education level: Not on file  Occupational History   Not on file  Tobacco Use   Smoking status: Never   Smokeless tobacco: Never  Vaping Use   Vaping status: Never Used  Substance and Sexual Activity   Alcohol use: No   Drug use: No   Sexual activity: Not on file  Other Topics Concern   Not on file  Social History Narrative   Not on file   Social Determinants of Health   Financial Resource Strain: Not on file  Food Insecurity: No Food Insecurity (08/05/2022)   Hunger Vital Sign    Worried About Running Out of Food in the Last Year: Never true    Ran Out of Food in the Last Year: Never true  Transportation Needs: No Transportation Needs (08/05/2022)   PRAPARE - Administrator, Civil Service (Medical): No    Lack of Transportation (Non-Medical): No  Physical Activity: Not on file  Stress: Not on file  Social Connections: Not on file  Intimate Partner Violence: Not At  Risk (08/05/2022)   Humiliation, Afraid, Rape, and Kick questionnaire    Fear of Current or Ex-Partner: No    Emotionally Abused: No    Physically Abused: No    Sexually Abused: No    Family History  Problem Relation Age of Onset   Hypertension Mother    Stroke Mother    Gout Father    Colon cancer Sister    Pancreatic cancer Daughter    Breast cancer Neg Hx     No Known Allergies  Review of Systems  Constitutional: Negative.  Negative for chills, diaphoresis, fever, malaise/fatigue and weight loss.  HENT: Negative.    Eyes: Negative.   Respiratory: Negative.  Negative for cough and shortness of breath.   Cardiovascular:  Negative.  Negative for chest pain, palpitations and leg swelling.  Gastrointestinal: Negative.  Negative for abdominal pain, constipation, diarrhea, heartburn, nausea and vomiting.  Genitourinary: Negative.  Negative for dysuria and flank pain.  Musculoskeletal: Negative.  Negative for joint pain and myalgias.  Skin: Negative.   Neurological: Negative.  Negative for dizziness and headaches.  Endo/Heme/Allergies: Negative.   Psychiatric/Behavioral: Negative.  Negative for depression and suicidal ideas. The patient is not nervous/anxious.        Objective:   BP (!) 162/86   Pulse (!) 59   Ht 5\' 3"  (1.6 m)   Wt 129 lb 12.8 oz (58.9 kg)   SpO2 97%   BMI 22.99 kg/m   Vitals:   06/20/23 0950  BP: (!) 162/86  Pulse: (!) 59  Height: 5\' 3"  (1.6 m)  Weight: 129 lb 12.8 oz (58.9 kg)  SpO2: 97%  BMI (Calculated): 23    Physical Exam Vitals and nursing note reviewed.  Constitutional:      Appearance: Normal appearance.  HENT:     Head: Normocephalic and atraumatic.     Nose: Nose normal.     Mouth/Throat:     Mouth: Mucous membranes are moist.     Pharynx: Oropharynx is clear.  Eyes:     Conjunctiva/sclera: Conjunctivae normal.     Pupils: Pupils are equal, round, and reactive to light.  Cardiovascular:     Rate and Rhythm: Normal rate and regular rhythm.     Pulses: Normal pulses.     Heart sounds: Normal heart sounds. No murmur heard. Pulmonary:     Effort: Pulmonary effort is normal.     Breath sounds: Normal breath sounds. No wheezing.  Abdominal:     General: Bowel sounds are normal.     Palpations: Abdomen is soft.     Tenderness: There is no abdominal tenderness. There is no right CVA tenderness or left CVA tenderness.  Musculoskeletal:        General: Normal range of motion.     Cervical back: Normal range of motion.     Right lower leg: No edema.     Left lower leg: No edema.  Skin:    General: Skin is warm and dry.  Neurological:     General: No focal  deficit present.     Mental Status: She is alert and oriented to person, place, and time.  Psychiatric:        Mood and Affect: Mood normal.        Behavior: Behavior normal.      Results for orders placed or performed in visit on 06/20/23  POCT CBG (Fasting - Glucose)  Result Value Ref Range   Glucose Fasting, POC 132 (A) 70 - 99 mg/dL  Recent Results (from the past 2160 hour(s))  Hemoglobin A1c     Status: Abnormal   Collection Time: 06/02/23 12:09 PM  Result Value Ref Range   Hgb A1c MFr Bld 8.7 (H) 4.8 - 5.6 %    Comment:          Prediabetes: 5.7 - 6.4          Diabetes: >6.4          Glycemic control for adults with diabetes: <7.0    Est. average glucose Bld gHb Est-mCnc 203 mg/dL  Lipid Panel w/o Chol/HDL Ratio     Status: None   Collection Time: 06/02/23 12:09 PM  Result Value Ref Range   Cholesterol, Total 167 100 - 199 mg/dL   Triglycerides 63 0 - 149 mg/dL   HDL 59 >29 mg/dL   VLDL Cholesterol Cal 12 5 - 40 mg/dL   LDL Chol Calc (NIH) 96 0 - 99 mg/dL  FAO13+YQMV     Status: Abnormal   Collection Time: 06/02/23 12:09 PM  Result Value Ref Range   Glucose 84 70 - 99 mg/dL   BUN 49 (H) 8 - 27 mg/dL   Creatinine, Ser 7.84 (H) 0.57 - 1.00 mg/dL   eGFR 21 (L) >69 GE/XBM/8.41   BUN/Creatinine Ratio 21 12 - 28   Sodium 144 134 - 144 mmol/L   Potassium 5.2 3.5 - 5.2 mmol/L   Chloride 107 (H) 96 - 106 mmol/L   CO2 24 20 - 29 mmol/L   Calcium 9.3 8.7 - 10.3 mg/dL   Total Protein 6.5 6.0 - 8.5 g/dL   Albumin 4.0 3.8 - 4.8 g/dL   Globulin, Total 2.5 1.5 - 4.5 g/dL   Bilirubin Total 0.4 0.0 - 1.2 mg/dL   Alkaline Phosphatase 121 44 - 121 IU/L   AST 22 0 - 40 IU/L   ALT 19 0 - 32 IU/L  CBC with Differential/Platelet     Status: Abnormal   Collection Time: 06/02/23 12:09 PM  Result Value Ref Range   WBC 6.9 3.4 - 10.8 x10E3/uL   RBC 3.68 (L) 3.77 - 5.28 x10E6/uL   Hemoglobin 11.5 11.1 - 15.9 g/dL   Hematocrit 32.4 40.1 - 46.6 %   MCV 95 79 - 97 fL   MCH 31.3  26.6 - 33.0 pg   MCHC 33.0 31.5 - 35.7 g/dL   RDW 02.7 25.3 - 66.4 %   Platelets 250 150 - 450 x10E3/uL   Neutrophils 64 Not Estab. %   Lymphs 21 Not Estab. %   Monocytes 10 Not Estab. %   Eos 4 Not Estab. %   Basos 1 Not Estab. %   Neutrophils Absolute 4.5 1.4 - 7.0 x10E3/uL   Lymphocytes Absolute 1.4 0.7 - 3.1 x10E3/uL   Monocytes Absolute 0.7 0.1 - 0.9 x10E3/uL   EOS (ABSOLUTE) 0.3 0.0 - 0.4 x10E3/uL   Basophils Absolute 0.1 0.0 - 0.2 x10E3/uL   Immature Granulocytes 0 Not Estab. %   Immature Grans (Abs) 0.0 0.0 - 0.1 x10E3/uL  POCT CBG (Fasting - Glucose)     Status: Abnormal   Collection Time: 06/06/23 10:39 AM  Result Value Ref Range   Glucose Fasting, POC 280 (A) 70 - 99 mg/dL  POCT CBG (Fasting - Glucose)     Status: Abnormal   Collection Time: 06/20/23  9:54 AM  Result Value Ref Range   Glucose Fasting, POC 132 (A) 70 - 99 mg/dL      Assessment & Plan:  Add hydralazine 25 mg twice a day.  To continue other medications as such.  Patient will bring her home blood pressure monitor at next follow-up for calibration. Problem List Items Addressed This Visit     Essential hypertension, benign - Primary   Relevant Medications   hydrALAZINE (APRESOLINE) 25 MG tablet   Type 2 diabetes mellitus with hyperglycemia, without long-term current use of insulin (HCC)   Relevant Orders   POCT CBG (Fasting - Glucose) (Completed)   HLD (hyperlipidemia)   Relevant Medications   hydrALAZINE (APRESOLINE) 25 MG tablet   Gastroesophageal reflux disease with esophagitis without hemorrhage   Stage 3b chronic kidney disease (HCC)    Return in about 3 weeks (around 07/11/2023).   Total time spent: 25 minutes  Margaretann Loveless, MD  06/20/2023   This document may have been prepared by [facility] Voice Recognition software and as such may include unintentional dictation errors.

## 2023-07-11 ENCOUNTER — Ambulatory Visit (INDEPENDENT_AMBULATORY_CARE_PROVIDER_SITE_OTHER): Payer: Medicare Other | Admitting: Internal Medicine

## 2023-07-11 ENCOUNTER — Encounter: Payer: Self-pay | Admitting: Internal Medicine

## 2023-07-11 VITALS — BP 146/90 | HR 59 | Ht 63.0 in | Wt 129.4 lb

## 2023-07-11 DIAGNOSIS — E782 Mixed hyperlipidemia: Secondary | ICD-10-CM

## 2023-07-11 DIAGNOSIS — E1165 Type 2 diabetes mellitus with hyperglycemia: Secondary | ICD-10-CM

## 2023-07-11 DIAGNOSIS — K2100 Gastro-esophageal reflux disease with esophagitis, without bleeding: Secondary | ICD-10-CM | POA: Diagnosis not present

## 2023-07-11 DIAGNOSIS — I10 Essential (primary) hypertension: Secondary | ICD-10-CM | POA: Diagnosis not present

## 2023-07-11 LAB — POCT CBG (FASTING - GLUCOSE)-MANUAL ENTRY: Glucose Fasting, POC: 88 mg/dL (ref 70–99)

## 2023-07-11 MED ORDER — RELION ALCOHOL SWABS PADS: 1.0000 | MEDICATED_PAD | Freq: Two times a day (BID) | 3 refills | Status: AC

## 2023-07-11 MED ORDER — AMLODIPINE BESYLATE 10 MG PO TABS
10.0000 mg | ORAL_TABLET | Freq: Every day | ORAL | 3 refills | Status: DC
Start: 2023-07-11 — End: 2024-07-09

## 2023-07-11 NOTE — Progress Notes (Signed)
Established Patient Office Visit  Subjective:  Patient ID: Patricia Cross, female    DOB: [birth year]  Age: 77 y.o. MRN: [id number]  Chief Complaint  Patient presents with   Follow-up    3 week follow up    Patient comes in for a blood pressure check since Hydralazine was added to her regimen at her last visit.  Her initial blood pressure was 146/90 but then with her own home monitor it was about 170/90.  Gradually her blood pressure kept going up as we were trying to calibrate with our monitor.  Patient admitted that she had not taken her blood pressure medicines before leaving the house because she had not eaten breakfast yet.  And also when she gets nervous and excited, her BP goes up. Patient had no complaints of chest pain, no shortness of breath no palpitations and no headaches or dizziness. Advised to go home, eat her breakfast and take her blood pressure medicines.  Lie down and recheck in 2 hours. In future she should never leave home in the mornings without taking her medications    No other concerns at this time.   Past Medical History:  Diagnosis Date   Acute renal failure (ARF) (HCC)    Anemia    Diabetes mellitus without complication (HCC) 10/18/1999   High cholesterol    Hypertension     Past Surgical History:  Procedure Laterality Date   ABDOMINAL HYSTERECTOMY  1981   BREAST BIOPSY Left 2005   neg core   BREAST BIOPSY Right 2015   neg core-Fibroadenoma   COLONOSCOPY WITH PROPOFOL N/A 01/07/2016   Procedure: COLONOSCOPY WITH PROPOFOL;  Surgeon: Wallace Cullens, MD;  Location: [facility] ENDOSCOPY;  Service: Gastroenterology;  Laterality: N/A;   COLONOSCOPY WITH PROPOFOL N/A 05/09/2018   Procedure: COLONOSCOPY WITH PROPOFOL;  Surgeon: Toledo, Boykin Nearing, MD;  Location: [facility] ENDOSCOPY;  Service: Gastroenterology;  Laterality: N/A;   ESOPHAGOGASTRODUODENOSCOPY (EGD) WITH PROPOFOL N/A 05/09/2018   Procedure: ESOPHAGOGASTRODUODENOSCOPY (EGD) WITH PROPOFOL;  Surgeon: Toledo,  Boykin Nearing, MD;  Location: [facility] ENDOSCOPY;  Service: Gastroenterology;  Laterality: N/A;   ESOPHAGOGASTRODUODENOSCOPY (EGD) WITH PROPOFOL N/A 08/05/2022   Procedure: ESOPHAGOGASTRODUODENOSCOPY (EGD) WITH PROPOFOL;  Surgeon: Midge Minium, MD;  Location: [facility] ENDOSCOPY;  Service: Endoscopy;  Laterality: N/A;   MICROLARYNGOSCOPY WITH CO2 LASER AND EXCISION OF VOCAL CORD LESION  1979   OOPHORECTOMY     one left, don't know which one   TUBAL LIGATION  1975    Social History   Socioeconomic History   Marital status: Widowed    Spouse name: Not on file   Number of children: Not on file   Years of education: Not on file   Highest education level: Not on file  Occupational History   Not on file  Tobacco Use   Smoking status: Never   Smokeless tobacco: Never  Vaping Use   Vaping status: Never Used  Substance and Sexual Activity   Alcohol use: No   Drug use: No   Sexual activity: Not on file  Other Topics Concern   Not on file  Social History Narrative   Not on file   Social Determinants of Health   Financial Resource Strain: Not on file  Food Insecurity: No Food Insecurity (08/05/2022)   Hunger Vital Sign    Worried About Running Out of Food in the Last Year: Never true    Ran Out of Food in the Last Year: Never true  Transportation Needs: No Transportation Needs (08/05/2022)  PRAPARE - Administrator, Civil Service (Medical): No    Lack of Transportation (Non-Medical): No  Physical Activity: Not on file  Stress: Not on file  Social Connections: Not on file  Intimate Partner Violence: Not At Risk (08/05/2022)   Humiliation, Afraid, Rape, and Kick questionnaire    Fear of Current or Ex-Partner: No    Emotionally Abused: No    Physically Abused: No    Sexually Abused: No    Family History  Problem Relation Age of Onset   Hypertension Mother    Stroke Mother    Gout Father    Colon cancer Sister    Pancreatic cancer Daughter    Breast cancer Neg Hx      No Known Allergies  Review of Systems  Constitutional: Negative.  Negative for chills, fever and weight loss.  HENT: Negative.  Negative for hearing loss, sinus pain and sore throat.   Eyes: Negative.   Respiratory: Negative.  Negative for cough, shortness of breath and stridor.   Cardiovascular: Negative.  Negative for chest pain, palpitations and leg swelling.  Gastrointestinal: Negative.  Negative for abdominal pain, constipation, diarrhea, heartburn, nausea and vomiting.  Genitourinary: Negative.  Negative for dysuria and flank pain.  Musculoskeletal: Negative.  Negative for joint pain and myalgias.  Skin: Negative.   Neurological: Negative.  Negative for dizziness, tingling, tremors and headaches.  Endo/Heme/Allergies: Negative.   Psychiatric/Behavioral: Negative.  Negative for depression and suicidal ideas. The patient is not nervous/anxious.        Objective:   BP (!) 146/90   Pulse (!) 59   Ht 5\' 3"  (1.6 m)   Wt 129 lb 6.4 oz (58.7 kg)   SpO2 97%   BMI 22.92 kg/m   Vitals:   07/11/23 0958  BP: (!) 146/90  Pulse: (!) 59  Height: 5\' 3"  (1.6 m)  Weight: 129 lb 6.4 oz (58.7 kg)  SpO2: 97%  BMI (Calculated): 22.93    Physical Exam Vitals and nursing note reviewed.  Constitutional:      Appearance: Normal appearance.  HENT:     Head: Normocephalic and atraumatic.     Nose: Nose normal.     Mouth/Throat:     Mouth: Mucous membranes are moist.     Pharynx: Oropharynx is clear.  Eyes:     Conjunctiva/sclera: Conjunctivae normal.     Pupils: Pupils are equal, round, and reactive to light.  Cardiovascular:     Rate and Rhythm: Normal rate and regular rhythm.     Pulses: Normal pulses.     Heart sounds: Normal heart sounds. No murmur heard. Pulmonary:     Effort: Pulmonary effort is normal.     Breath sounds: Normal breath sounds. No wheezing.  Abdominal:     General: Bowel sounds are normal.     Palpations: Abdomen is soft.     Tenderness: There is no  abdominal tenderness. There is no right CVA tenderness or left CVA tenderness.  Musculoskeletal:        General: Normal range of motion.     Cervical back: Normal range of motion.     Right lower leg: No edema.     Left lower leg: No edema.  Skin:    General: Skin is warm and dry.  Neurological:     General: No focal deficit present.     Mental Status: She is alert and oriented to person, place, and time.  Psychiatric:  Mood and Affect: Mood normal.        Behavior: Behavior normal.      Results for orders placed or performed in visit on 07/11/23  POCT CBG (Fasting - Glucose)  Result Value Ref Range   Glucose Fasting, POC 88 70 - 99 mg/dL    Recent Results (from the past 2160 hour(s))  Hemoglobin A1c     Status: Abnormal   Collection Time: 06/02/23 12:09 PM  Result Value Ref Range   Hgb A1c MFr Bld 8.7 (H) 4.8 - 5.6 %    Comment:          Prediabetes: 5.7 - 6.4          Diabetes: >6.4          Glycemic control for adults with diabetes: <7.0    Est. average glucose Bld gHb Est-mCnc 203 mg/dL  Lipid Panel w/o Chol/HDL Ratio     Status: None   Collection Time: 06/02/23 12:09 PM  Result Value Ref Range   Cholesterol, Total 167 100 - 199 mg/dL   Triglycerides 63 0 - 149 mg/dL   HDL 59 >16 mg/dL   VLDL Cholesterol Cal 12 5 - 40 mg/dL   LDL Chol Calc (NIH) 96 0 - 99 mg/dL  [id number]     Status: Abnormal   Collection Time: 06/02/23 12:09 PM  Result Value Ref Range   Glucose 84 70 - 99 mg/dL   BUN 49 (H) 8 - 27 mg/dL   Creatinine, Ser 9.81 (H) 0.57 - 1.00 mg/dL   eGFR 21 (L) >19 JY/NWG/9.56   BUN/Creatinine Ratio 21 12 - 28   Sodium 144 134 - 144 mmol/L   Potassium 5.2 3.5 - 5.2 mmol/L   Chloride 107 (H) 96 - 106 mmol/L   CO2 24 20 - 29 mmol/L   Calcium 9.3 8.7 - 10.3 mg/dL   Total Protein 6.5 6.0 - 8.5 g/dL   Albumin 4.0 3.8 - 4.8 g/dL   Globulin, Total 2.5 1.5 - 4.5 g/dL   Bilirubin Total 0.4 0.0 - 1.2 mg/dL   Alkaline Phosphatase 121 44 - 121 IU/L   AST 22  0 - 40 IU/L   ALT 19 0 - 32 IU/L  CBC with Differential/Platelet     Status: Abnormal   Collection Time: 06/02/23 12:09 PM  Result Value Ref Range   WBC 6.9 3.4 - 10.8 x10E3/uL   RBC 3.68 (L) 3.77 - 5.28 x10E6/uL   Hemoglobin 11.5 11.1 - 15.9 g/dL   Hematocrit 21.3 08.6 - 46.6 %   MCV 95 79 - 97 fL   MCH 31.3 26.6 - 33.0 pg   MCHC 33.0 31.5 - 35.7 g/dL   RDW 57.8 46.9 - 62.9 %   Platelets 250 150 - 450 x10E3/uL   Neutrophils 64 Not Estab. %   Lymphs 21 Not Estab. %   Monocytes 10 Not Estab. %   Eos 4 Not Estab. %   Basos 1 Not Estab. %   Neutrophils Absolute 4.5 1.4 - 7.0 x10E3/uL   Lymphocytes Absolute 1.4 0.7 - 3.1 x10E3/uL   Monocytes Absolute 0.7 0.1 - 0.9 x10E3/uL   EOS (ABSOLUTE) 0.3 0.0 - 0.4 x10E3/uL   Basophils Absolute 0.1 0.0 - 0.2 x10E3/uL   Immature Granulocytes 0 Not Estab. %   Immature Grans (Abs) 0.0 0.0 - 0.1 x10E3/uL  POCT CBG (Fasting - Glucose)     Status: Abnormal   Collection Time: 06/06/23 10:39 AM  Result Value Ref Range  Glucose Fasting, POC 280 (A) 70 - 99 mg/dL  POCT CBG (Fasting - Glucose)     Status: Abnormal   Collection Time: 06/20/23  9:54 AM  Result Value Ref Range   Glucose Fasting, POC 132 (A) 70 - 99 mg/dL  POCT CBG (Fasting - Glucose)     Status: None   Collection Time: 07/11/23 10:04 AM  Result Value Ref Range   Glucose Fasting, POC 88 70 - 99 mg/dL      Assessment & Plan:  Continue all medications.  Monitor BP at home. Problem List Items Addressed This Visit     Essential hypertension, benign - Primary   Relevant Medications   amLODipine (NORVASC) 10 MG tablet   Type 2 diabetes mellitus with hyperglycemia, without long-term current use of insulin (HCC)   Relevant Medications   glipiZIDE (GLUCOTROL XL) 2.5 MG 24 hr tablet   Other Relevant Orders   POCT CBG (Fasting - Glucose) (Completed)   HLD (hyperlipidemia)   Relevant Medications   amLODipine (NORVASC) 10 MG tablet   Gastroesophageal reflux disease with esophagitis  without hemorrhage    Return in about 10 days (around 07/21/2023).   Total time spent: 30 minutes  Margaretann Loveless, MD  07/11/2023   This document may have been prepared by [facility] Voice Recognition software and as such may include unintentional dictation errors.

## 2023-07-11 NOTE — Progress Notes (Deleted)
New Patient Office Visit  Subjective    Patient ID: Patricia Cross, female    DOB: July 01, 1946  Age: 77 y.o. MRN: [id number]  CC:  Chief Complaint  Patient presents with  . Follow-up    3 week follow up    HPI Patricia Cross presents to establish care Previous Primary Care provider/office:   she {DOES NOT does:27190::"does not"} have additional concerns to discuss today.   HPI  Outpatient Encounter Medications as of 07/11/2023  Medication Sig  . ASPIRIN 81 PO Aspirin 81 MG Oral Tablet QTY: 0 tablet Days: 0 Refills: 0  Written: 09/07/18 Patient Instructions:  . FARXIGA 10 MG TABS tablet Take 10 mg by mouth daily.  . ferrous sulfate 325 (65 FE) MG EC tablet Take 1 tablet (325 mg total) by mouth 2 (two) times daily with a meal.  . glipiZIDE (GLUCOTROL XL) 2.5 MG 24 hr tablet Take 2.5 mg by mouth 2 (two) times daily.  . hydrALAZINE (APRESOLINE) 25 MG tablet Take 1 tablet (25 mg total) by mouth 2 (two) times daily.  . insulin glargine (LANTUS SOLOSTAR) 100 UNIT/ML Solostar Pen Inject 24 Units into the skin daily.  Marland Kitchen losartan (COZAAR) 25 MG tablet Take 25 mg by mouth daily.  . metoCLOPramide (REGLAN) 5 MG tablet Take 1 tablet (5 mg total) by mouth 2 (two) times daily.  . Multiple Vitamins-Minerals (CENTRUM SILVER) tablet Centrum Silver Oral Tablet QTY: 0 tablet Days: 0 Refills: 0  Written: 11/12/15 Patient Instructions:  . rosuvastatin (CRESTOR) 40 MG tablet Take 1 tablet by mouth once daily  . amLODipine (NORVASC) 10 MG tablet Take 10 mg by mouth daily. (Patient not taking: Reported on 07/11/2023)  . pantoprazole (PROTONIX) 40 MG tablet Take 1 tablet (40 mg total) by mouth daily. (Patient not taking: Reported on 07/11/2023)  . [DISCONTINUED] glipiZIDE (GLUCOTROL) 5 MG tablet Take 0.5 tablets (2.5 mg total) by mouth 2 (two) times daily before a meal. (Patient not taking: Reported on 07/11/2023)   No facility-administered encounter medications on file as of 07/11/2023.    Past  Medical History:  Diagnosis Date  . Acute renal failure (ARF) (HCC)   . Anemia   . Diabetes mellitus without complication (HCC) 10/18/1999  . High cholesterol   . Hypertension     Past Surgical History:  Procedure Laterality Date  . ABDOMINAL HYSTERECTOMY  1981  . BREAST BIOPSY Left 2005   neg core  . BREAST BIOPSY Right 2015   neg core-Fibroadenoma  . COLONOSCOPY WITH PROPOFOL N/A 01/07/2016   Procedure: COLONOSCOPY WITH PROPOFOL;  Surgeon: Wallace Cullens, MD;  Location: [facility] ENDOSCOPY;  Service: Gastroenterology;  Laterality: N/A;  . COLONOSCOPY WITH PROPOFOL N/A 05/09/2018   Procedure: COLONOSCOPY WITH PROPOFOL;  Surgeon: Toledo, Boykin Nearing, MD;  Location: [facility] ENDOSCOPY;  Service: Gastroenterology;  Laterality: N/A;  . ESOPHAGOGASTRODUODENOSCOPY (EGD) WITH PROPOFOL N/A 05/09/2018   Procedure: ESOPHAGOGASTRODUODENOSCOPY (EGD) WITH PROPOFOL;  Surgeon: Toledo, Boykin Nearing, MD;  Location: [facility] ENDOSCOPY;  Service: Gastroenterology;  Laterality: N/A;  . ESOPHAGOGASTRODUODENOSCOPY (EGD) WITH PROPOFOL N/A 08/05/2022   Procedure: ESOPHAGOGASTRODUODENOSCOPY (EGD) WITH PROPOFOL;  Surgeon: Midge Minium, MD;  Location: [facility] ENDOSCOPY;  Service: Endoscopy;  Laterality: N/A;  . MICROLARYNGOSCOPY WITH CO2 LASER AND EXCISION OF VOCAL CORD LESION  1979  . OOPHORECTOMY     one left, don't know which one  . TUBAL LIGATION  1975    Family History  Problem Relation Age of Onset  . Hypertension Mother   . Stroke Mother   .  Gout Father   . Colon cancer Sister   . Pancreatic cancer Daughter   . Breast cancer Neg Hx     Social History   Socioeconomic History  . Marital status: Widowed    Spouse name: Not on file  . Number of children: Not on file  . Years of education: Not on file  . Highest education level: Not on file  Occupational History  . Not on file  Tobacco Use  . Smoking status: Never  . Smokeless tobacco: Never  Vaping Use  . Vaping status: Never Used  Substance and Sexual Activity   . Alcohol use: No  . Drug use: No  . Sexual activity: Not on file  Other Topics Concern  . Not on file  Social History Narrative  . Not on file   Social Determinants of Health   Financial Resource Strain: Not on file  Food Insecurity: No Food Insecurity (08/05/2022)   Hunger Vital Sign   . Worried About Programme researcher, broadcasting/film/video in the Last Year: Never true   . Ran Out of Food in the Last Year: Never true  Transportation Needs: No Transportation Needs (08/05/2022)   PRAPARE - Transportation   . Lack of Transportation (Medical): No   . Lack of Transportation (Non-Medical): No  Physical Activity: Not on file  Stress: Not on file  Social Connections: Not on file  Intimate Partner Violence: Not At Risk (08/05/2022)   Humiliation, Afraid, Rape, and Kick questionnaire   . Fear of Current or Ex-Partner: No   . Emotionally Abused: No   . Physically Abused: No   . Sexually Abused: No    ROS      Objective    BP (!) 146/90   Pulse (!) 59   Ht 5\' 3"  (1.6 m)   Wt 129 lb 6.4 oz (58.7 kg)   SpO2 97%   BMI 22.92 kg/m   Physical Exam     Assessment & Plan:   Problem List Items Addressed This Visit     Essential hypertension, benign   Type 2 diabetes mellitus with hyperglycemia, without long-term current use of insulin (HCC) - Primary   Relevant Medications   glipiZIDE (GLUCOTROL XL) 2.5 MG 24 hr tablet   Other Relevant Orders   POCT CBG (Fasting - Glucose) (Completed)    No follow-ups on file.   Total time spent: {AMA time spent:29001} minutes  Margaretann Loveless, MD  07/11/2023   This document may have been prepared by [facility] Voice Recognition software and as such may include unintentional dictation errors.

## 2023-07-21 ENCOUNTER — Ambulatory Visit: Payer: Medicare Other | Admitting: Internal Medicine

## 2023-07-25 ENCOUNTER — Ambulatory Visit (INDEPENDENT_AMBULATORY_CARE_PROVIDER_SITE_OTHER): Payer: Medicare Other | Admitting: Internal Medicine

## 2023-07-25 ENCOUNTER — Encounter: Payer: Self-pay | Admitting: Internal Medicine

## 2023-07-25 VITALS — BP 150/68 | HR 69 | Ht 63.0 in | Wt 129.6 lb

## 2023-07-25 DIAGNOSIS — K2100 Gastro-esophageal reflux disease with esophagitis, without bleeding: Secondary | ICD-10-CM | POA: Diagnosis not present

## 2023-07-25 DIAGNOSIS — I10 Essential (primary) hypertension: Secondary | ICD-10-CM

## 2023-07-25 DIAGNOSIS — E782 Mixed hyperlipidemia: Secondary | ICD-10-CM | POA: Diagnosis not present

## 2023-07-25 DIAGNOSIS — E1165 Type 2 diabetes mellitus with hyperglycemia: Secondary | ICD-10-CM | POA: Diagnosis not present

## 2023-07-25 DIAGNOSIS — N1832 Chronic kidney disease, stage 3b: Secondary | ICD-10-CM

## 2023-07-25 LAB — POCT CBG (FASTING - GLUCOSE)-MANUAL ENTRY: Glucose Fasting, POC: 146 mg/dL — AB (ref 70–99)

## 2023-07-25 MED ORDER — HYDRALAZINE HCL 50 MG PO TABS
50.0000 mg | ORAL_TABLET | Freq: Two times a day (BID) | ORAL | 3 refills | Status: DC
Start: 2023-07-25 — End: 2023-12-18

## 2023-07-25 NOTE — Progress Notes (Signed)
Established Patient Office Visit  Subjective:  Patient ID: Patricia Cross, female    DOB: 1946-02-19  Age: 77 y.o. MRN: [id number]  Chief Complaint  Patient presents with   Follow-up    10 day follow up    Patient comes in for her blood pressure follow-up.  It is still high at 150/68.  Today she has taken her medications early in the morning. Bring her medication bottles at this time but assures [place] that she is taking everything on her list. She is supposed to be on Amlodipine 10 mg/day, Losartan 25 mg/day, and Hydralazine was recently added to 25 mg twice a day. Last year most of her medications were stopped as she was admitted for hypotension, dehydration and acute renal failure. Since then her blood pressure is gradually rising and medications have been slowly added back in. Patient advised to increase her hydralazine to 50 mg twice a day. She has an appointment with the nephrologist next and is advised to take all her medication bottles with her to them. Patient however feels well and has no complaints of headaches, dizziness, chest pain or shortness of breath.  She likes going to work every morning.    No other concerns at this time.   Past Medical History:  Diagnosis Date   Acute renal failure (ARF) (HCC)    Anemia    Diabetes mellitus without complication (HCC) 10/18/1999   High cholesterol    Hypertension     Past Surgical History:  Procedure Laterality Date   ABDOMINAL HYSTERECTOMY  1981   BREAST BIOPSY Left 2005   neg core   BREAST BIOPSY Right 2015   neg core-Fibroadenoma   COLONOSCOPY WITH PROPOFOL N/A 01/07/2016   Procedure: COLONOSCOPY WITH PROPOFOL;  Surgeon: Wallace Cullens, MD;  Location: [facility] ENDOSCOPY;  Service: Gastroenterology;  Laterality: N/A;   COLONOSCOPY WITH PROPOFOL N/A 05/09/2018   Procedure: COLONOSCOPY WITH PROPOFOL;  Surgeon: Toledo, Boykin Nearing, MD;  Location: [facility] ENDOSCOPY;  Service: Gastroenterology;  Laterality: N/A;    ESOPHAGOGASTRODUODENOSCOPY (EGD) WITH PROPOFOL N/A 05/09/2018   Procedure: ESOPHAGOGASTRODUODENOSCOPY (EGD) WITH PROPOFOL;  Surgeon: Toledo, Boykin Nearing, MD;  Location: [facility] ENDOSCOPY;  Service: Gastroenterology;  Laterality: N/A;   ESOPHAGOGASTRODUODENOSCOPY (EGD) WITH PROPOFOL N/A 08/05/2022   Procedure: ESOPHAGOGASTRODUODENOSCOPY (EGD) WITH PROPOFOL;  Surgeon: Midge Minium, MD;  Location: [facility] ENDOSCOPY;  Service: Endoscopy;  Laterality: N/A;   MICROLARYNGOSCOPY WITH CO2 LASER AND EXCISION OF VOCAL CORD LESION  1979   OOPHORECTOMY     one left, don't know which one   TUBAL LIGATION  1975    Social History   Socioeconomic History   Marital status: Widowed    Spouse name: Not on file   Number of children: Not on file   Years of education: Not on file   Highest education level: Not on file  Occupational History   Not on file  Tobacco Use   Smoking status: Never   Smokeless tobacco: Never  Vaping Use   Vaping status: Never Used  Substance and Sexual Activity   Alcohol use: No   Drug use: No   Sexual activity: Not on file  Other Topics Concern   Not on file  Social History Narrative   Not on file   Social Determinants of Health   Financial Resource Strain: Not on file  Food Insecurity: No Food Insecurity (08/05/2022)   Hunger Vital Sign    Worried About Running Out of Food in the Last Year: Never true  Ran Out of Food in the Last Year: Never true  Transportation Needs: No Transportation Needs (08/05/2022)   PRAPARE - Administrator, Civil Service (Medical): No    Lack of Transportation (Non-Medical): No  Physical Activity: Not on file  Stress: Not on file  Social Connections: Not on file  Intimate Partner Violence: Not At Risk (08/05/2022)   Humiliation, Afraid, Rape, and Kick questionnaire    Fear of Current or Ex-Partner: No    Emotionally Abused: No    Physically Abused: No    Sexually Abused: No    Family History  Problem Relation Age of Onset    Hypertension Mother    Stroke Mother    Gout Father    Colon cancer Sister    Pancreatic cancer Daughter    Breast cancer Neg Hx     No Known Allergies  Review of Systems  Constitutional: Negative.  Negative for chills, fever, malaise/fatigue and weight loss.  HENT: Negative.  Negative for hearing loss and sore throat.   Eyes: Negative.   Respiratory: Negative.  Negative for cough and shortness of breath.   Cardiovascular: Negative.  Negative for chest pain, palpitations and leg swelling.  Gastrointestinal: Negative.  Negative for abdominal pain, blood in stool, constipation, diarrhea, heartburn, melena, nausea and vomiting.  Genitourinary: Negative.  Negative for dysuria and flank pain.  Musculoskeletal: Negative.  Negative for joint pain and myalgias.  Skin: Negative.   Neurological: Negative.  Negative for dizziness and headaches.  Endo/Heme/Allergies: Negative.   Psychiatric/Behavioral: Negative.  Negative for depression and suicidal ideas. The patient is not nervous/anxious.        Objective:   BP (!) 150/68   Pulse 69   Ht 5\' 3"  (1.6 m)   Wt 129 lb 9.6 oz (58.8 kg)   SpO2 97%   BMI 22.96 kg/m   Vitals:   07/25/23 0956  BP: (!) 150/68  Pulse: 69  Height: 5\' 3"  (1.6 m)  Weight: 129 lb 9.6 oz (58.8 kg)  SpO2: 97%  BMI (Calculated): 22.96    Physical Exam Vitals and nursing note reviewed.  Constitutional:      Appearance: Normal appearance.  HENT:     Head: Normocephalic and atraumatic.     Nose: Nose normal.     Mouth/Throat:     Mouth: Mucous membranes are moist.     Pharynx: Oropharynx is clear.  Eyes:     Conjunctiva/sclera: Conjunctivae normal.     Pupils: Pupils are equal, round, and reactive to light.  Cardiovascular:     Rate and Rhythm: Normal rate and regular rhythm.     Pulses: Normal pulses.     Heart sounds: Normal heart sounds. No murmur heard. Pulmonary:     Effort: Pulmonary effort is normal.     Breath sounds: Normal breath sounds.  No wheezing.  Abdominal:     General: Bowel sounds are normal.     Palpations: Abdomen is soft.     Tenderness: There is no abdominal tenderness. There is no right CVA tenderness or left CVA tenderness.  Musculoskeletal:        General: Normal range of motion.     Cervical back: Normal range of motion.     Right lower leg: No edema.     Left lower leg: No edema.  Skin:    General: Skin is warm and dry.  Neurological:     General: No focal deficit present.     Mental Status: She is  alert and oriented to person, place, and time.  Psychiatric:        Mood and Affect: Mood normal.        Behavior: Behavior normal.      Results for orders placed or performed in visit on 07/25/23  POCT CBG (Fasting - Glucose)  Result Value Ref Range   Glucose Fasting, POC 146 (A) 70 - 99 mg/dL    Recent Results (from the past 2160 hour(s))  Hemoglobin A1c     Status: Abnormal   Collection Time: 06/02/23 12:09 PM  Result Value Ref Range   Hgb A1c MFr Bld 8.7 (H) 4.8 - 5.6 %    Comment:          Prediabetes: 5.7 - 6.4          Diabetes: >6.4          Glycemic control for adults with diabetes: <7.0    Est. average glucose Bld gHb Est-mCnc 203 mg/dL  Lipid Panel w/o Chol/HDL Ratio     Status: None   Collection Time: 06/02/23 12:09 PM  Result Value Ref Range   Cholesterol, Total 167 100 - 199 mg/dL   Triglycerides 63 0 - 149 mg/dL   HDL 59 >54 mg/dL   VLDL Cholesterol Cal 12 5 - 40 mg/dL   LDL Chol Calc (NIH) 96 0 - 99 mg/dL  [id number]     Status: Abnormal   Collection Time: 06/02/23 12:09 PM  Result Value Ref Range   Glucose 84 70 - 99 mg/dL   BUN 49 (H) 8 - 27 mg/dL   Creatinine, Ser 8.29 (H) 0.57 - 1.00 mg/dL   eGFR 21 (L) >56 OZ/HYQ/6.57   BUN/Creatinine Ratio 21 12 - 28   Sodium 144 134 - 144 mmol/L   Potassium 5.2 3.5 - 5.2 mmol/L   Chloride 107 (H) 96 - 106 mmol/L   CO2 24 20 - 29 mmol/L   Calcium 9.3 8.7 - 10.3 mg/dL   Total Protein 6.5 6.0 - 8.5 g/dL   Albumin 4.0 3.8 - 4.8  g/dL   Globulin, Total 2.5 1.5 - 4.5 g/dL   Bilirubin Total 0.4 0.0 - 1.2 mg/dL   Alkaline Phosphatase 121 44 - 121 IU/L   AST 22 0 - 40 IU/L   ALT 19 0 - 32 IU/L  CBC with Differential/Platelet     Status: Abnormal   Collection Time: 06/02/23 12:09 PM  Result Value Ref Range   WBC 6.9 3.4 - 10.8 x10E3/uL   RBC 3.68 (L) 3.77 - 5.28 x10E6/uL   Hemoglobin 11.5 11.1 - 15.9 g/dL   Hematocrit 84.6 96.2 - 46.6 %   MCV 95 79 - 97 fL   MCH 31.3 26.6 - 33.0 pg   MCHC 33.0 31.5 - 35.7 g/dL   RDW 95.2 84.1 - 32.4 %   Platelets 250 150 - 450 x10E3/uL   Neutrophils 64 Not Estab. %   Lymphs 21 Not Estab. %   Monocytes 10 Not Estab. %   Eos 4 Not Estab. %   Basos 1 Not Estab. %   Neutrophils Absolute 4.5 1.4 - 7.0 x10E3/uL   Lymphocytes Absolute 1.4 0.7 - 3.1 x10E3/uL   Monocytes Absolute 0.7 0.1 - 0.9 x10E3/uL   EOS (ABSOLUTE) 0.3 0.0 - 0.4 x10E3/uL   Basophils Absolute 0.1 0.0 - 0.2 x10E3/uL   Immature Granulocytes 0 Not Estab. %   Immature Grans (Abs) 0.0 0.0 - 0.1 x10E3/uL  POCT CBG (Fasting - Glucose)  Status: Abnormal   Collection Time: 06/06/23 10:39 AM  Result Value Ref Range   Glucose Fasting, POC 280 (A) 70 - 99 mg/dL  POCT CBG (Fasting - Glucose)     Status: Abnormal   Collection Time: 06/20/23  9:54 AM  Result Value Ref Range   Glucose Fasting, POC 132 (A) 70 - 99 mg/dL  POCT CBG (Fasting - Glucose)     Status: None   Collection Time: 07/11/23 10:04 AM  Result Value Ref Range   Glucose Fasting, POC 88 70 - 99 mg/dL  POCT CBG (Fasting - Glucose)     Status: Abnormal   Collection Time: 07/25/23 10:00 AM  Result Value Ref Range   Glucose Fasting, POC 146 (A) 70 - 99 mg/dL      Assessment & Plan:  Patient will continue all her current medications. Increase hydralazine to 50 mg twice a day. Monitor blood pressure at home. Advised to bring in all her medications at home at her next visit Problem List Items Addressed This Visit     Essential hypertension, benign -  Primary   Relevant Medications   hydrALAZINE (APRESOLINE) 50 MG tablet   Type 2 diabetes mellitus with hyperglycemia, without long-term current use of insulin (HCC)   Relevant Orders   POCT CBG (Fasting - Glucose) (Completed)   HLD (hyperlipidemia)   Relevant Medications   hydrALAZINE (APRESOLINE) 50 MG tablet   Gastroesophageal reflux disease with esophagitis without hemorrhage   Stage 3b chronic kidney disease (HCC)    Return in about 1 month (around 08/25/2023).   Total time spent: 30 minutes  Margaretann Loveless, MD  07/25/2023   This document may have been prepared by [facility] Voice Recognition software and as such may include unintentional dictation errors.

## 2023-08-18 ENCOUNTER — Other Ambulatory Visit: Payer: Self-pay | Admitting: Internal Medicine

## 2023-08-18 DIAGNOSIS — R110 Nausea: Secondary | ICD-10-CM

## 2023-08-28 ENCOUNTER — Other Ambulatory Visit: Payer: Self-pay | Admitting: Internal Medicine

## 2023-08-28 DIAGNOSIS — E782 Mixed hyperlipidemia: Secondary | ICD-10-CM

## 2023-08-29 ENCOUNTER — Encounter: Payer: Self-pay | Admitting: Internal Medicine

## 2023-08-29 ENCOUNTER — Ambulatory Visit (INDEPENDENT_AMBULATORY_CARE_PROVIDER_SITE_OTHER): Payer: Medicare Other | Admitting: Internal Medicine

## 2023-08-29 VITALS — BP 130/70 | HR 70 | Ht 63.0 in | Wt 127.8 lb

## 2023-08-29 DIAGNOSIS — E1165 Type 2 diabetes mellitus with hyperglycemia: Secondary | ICD-10-CM | POA: Diagnosis not present

## 2023-08-29 DIAGNOSIS — D508 Other iron deficiency anemias: Secondary | ICD-10-CM

## 2023-08-29 DIAGNOSIS — E782 Mixed hyperlipidemia: Secondary | ICD-10-CM | POA: Diagnosis not present

## 2023-08-29 DIAGNOSIS — E1159 Type 2 diabetes mellitus with other circulatory complications: Secondary | ICD-10-CM

## 2023-08-29 DIAGNOSIS — I152 Hypertension secondary to endocrine disorders: Secondary | ICD-10-CM | POA: Insufficient documentation

## 2023-08-29 DIAGNOSIS — Z13820 Encounter for screening for osteoporosis: Secondary | ICD-10-CM | POA: Diagnosis not present

## 2023-08-29 DIAGNOSIS — N1832 Chronic kidney disease, stage 3b: Secondary | ICD-10-CM

## 2023-08-29 DIAGNOSIS — E1122 Type 2 diabetes mellitus with diabetic chronic kidney disease: Secondary | ICD-10-CM

## 2023-08-29 DIAGNOSIS — N183 Chronic kidney disease, stage 3 unspecified: Secondary | ICD-10-CM

## 2023-08-29 DIAGNOSIS — E1169 Type 2 diabetes mellitus with other specified complication: Secondary | ICD-10-CM

## 2023-08-29 LAB — GLUCOSE, POCT (MANUAL RESULT ENTRY): POC Glucose: 130 mg/dL — AB (ref 70–99)

## 2023-08-29 NOTE — Progress Notes (Signed)
Established Patient Office Visit  Subjective:  Patient ID: Patricia Cross, female    DOB: [birth year]  Age: 77 y.o. MRN: [id number]  Chief Complaint  Patient presents with   Follow-up    1 month    Patient comes in for her follow-up today.  She is feeling well and his blood pressure is looking much better now.  She will return fasting for her lab work.    No other concerns at this time.   Past Medical History:  Diagnosis Date   Acute renal failure (ARF) (HCC)    Anemia    Diabetes mellitus without complication (HCC) 10/18/1999   High cholesterol    Hypertension     Past Surgical History:  Procedure Laterality Date   ABDOMINAL HYSTERECTOMY  1981   BREAST BIOPSY Left 2005   neg core   BREAST BIOPSY Right 2015   neg core-Fibroadenoma   COLONOSCOPY WITH PROPOFOL N/A 01/07/2016   Procedure: COLONOSCOPY WITH PROPOFOL;  Surgeon: Wallace Cullens, MD;  Location: [facility] ENDOSCOPY;  Service: Gastroenterology;  Laterality: N/A;   COLONOSCOPY WITH PROPOFOL N/A 05/09/2018   Procedure: COLONOSCOPY WITH PROPOFOL;  Surgeon: Toledo, Boykin Nearing, MD;  Location: [facility] ENDOSCOPY;  Service: Gastroenterology;  Laterality: N/A;   ESOPHAGOGASTRODUODENOSCOPY (EGD) WITH PROPOFOL N/A 05/09/2018   Procedure: ESOPHAGOGASTRODUODENOSCOPY (EGD) WITH PROPOFOL;  Surgeon: Toledo, Boykin Nearing, MD;  Location: [facility] ENDOSCOPY;  Service: Gastroenterology;  Laterality: N/A;   ESOPHAGOGASTRODUODENOSCOPY (EGD) WITH PROPOFOL N/A 08/05/2022   Procedure: ESOPHAGOGASTRODUODENOSCOPY (EGD) WITH PROPOFOL;  Surgeon: Midge Minium, MD;  Location: [facility] ENDOSCOPY;  Service: Endoscopy;  Laterality: N/A;   MICROLARYNGOSCOPY WITH CO2 LASER AND EXCISION OF VOCAL CORD LESION  1979   OOPHORECTOMY     one left, don't know which one   TUBAL LIGATION  1975    Social History   Socioeconomic History   Marital status: Widowed    Spouse name: Not on file   Number of children: Not on file   Years of education: Not on file   Highest education  level: Not on file  Occupational History   Not on file  Tobacco Use   Smoking status: Never   Smokeless tobacco: Never  Vaping Use   Vaping status: Never Used  Substance and Sexual Activity   Alcohol use: No   Drug use: No   Sexual activity: Not on file  Other Topics Concern   Not on file  Social History Narrative   Not on file   Social Determinants of Health   Financial Resource Strain: Not on file  Food Insecurity: No Food Insecurity (08/05/2022)   Hunger Vital Sign    Worried About Running Out of Food in the Last Year: Never true    Ran Out of Food in the Last Year: Never true  Transportation Needs: No Transportation Needs (08/05/2022)   PRAPARE - Administrator, Civil Service (Medical): No    Lack of Transportation (Non-Medical): No  Physical Activity: Not on file  Stress: Not on file  Social Connections: Not on file  Intimate Partner Violence: Not At Risk (08/05/2022)   Humiliation, Afraid, Rape, and Kick questionnaire    Fear of Current or Ex-Partner: No    Emotionally Abused: No    Physically Abused: No    Sexually Abused: No    Family History  Problem Relation Age of Onset   Hypertension Mother    Stroke Mother    Gout Father    Colon cancer Sister  Pancreatic cancer Daughter    Breast cancer Neg Hx     No Known Allergies  Review of Systems  Constitutional: Negative.  Negative for chills, diaphoresis, fever, malaise/fatigue and weight loss.  HENT: Negative.    Eyes: Negative.   Respiratory: Negative.  Negative for cough and shortness of breath.   Cardiovascular: Negative.  Negative for chest pain, palpitations and leg swelling.  Gastrointestinal: Negative.  Negative for abdominal pain, constipation, diarrhea, heartburn, nausea and vomiting.  Genitourinary: Negative.  Negative for dysuria and flank pain.  Musculoskeletal: Negative.  Negative for joint pain and myalgias.  Skin: Negative.   Neurological: Negative.  Negative for dizziness,  tingling, tremors, sensory change and headaches.  Endo/Heme/Allergies: Negative.   Psychiatric/Behavioral: Negative.  Negative for depression and suicidal ideas. The patient is not nervous/anxious.      Objective:   BP 130/70   Pulse 70   Ht 5\' 3"  (1.6 m)   Wt 127 lb 12.8 oz (58 kg)   SpO2 97%   BMI 22.64 kg/m   Vitals:   08/29/23 1027  BP: 130/70  Pulse: 70  Height: 5\' 3"  (1.6 m)  Weight: 127 lb 12.8 oz (58 kg)  SpO2: 97%  BMI (Calculated): 22.64    Physical Exam Vitals and nursing note reviewed.  Constitutional:      Appearance: Normal appearance.  HENT:     Head: Normocephalic and atraumatic.     Nose: Nose normal.     Mouth/Throat:     Mouth: Mucous membranes are moist.     Pharynx: Oropharynx is clear.  Eyes:     Conjunctiva/sclera: Conjunctivae normal.     Pupils: Pupils are equal, round, and reactive to light.  Cardiovascular:     Rate and Rhythm: Normal rate and regular rhythm.     Pulses: Normal pulses.     Heart sounds: Normal heart sounds. No murmur heard. Pulmonary:     Effort: Pulmonary effort is normal.     Breath sounds: Normal breath sounds. No wheezing.  Abdominal:     General: Bowel sounds are normal.     Palpations: Abdomen is soft.     Tenderness: There is no abdominal tenderness. There is no right CVA tenderness or left CVA tenderness.  Musculoskeletal:        General: Normal range of motion.     Cervical back: Normal range of motion.     Right lower leg: No edema.     Left lower leg: No edema.  Skin:    General: Skin is warm and dry.  Neurological:     General: No focal deficit present.     Mental Status: She is alert and oriented to person, place, and time.  Psychiatric:        Mood and Affect: Mood normal.        Behavior: Behavior normal.      Results for orders placed or performed in visit on 08/29/23  POCT Glucose (CBG)  Result Value Ref Range   POC Glucose 130 (A) 70 - 99 mg/dl        Assessment & Plan:  Continue  current medications for now. Fasting labs. Schedule DEXA scan. Problem List Items Addressed This Visit     Type 2 diabetes mellitus with hyperglycemia, without long-term current use of insulin (HCC)   Relevant Orders   POCT Glucose (CBG) (Completed)   Absolute anemia   Relevant Orders   CBC with Diff   Stage 3b chronic kidney disease (HCC)  Relevant Orders   CBC with Diff   Hypertension associated with diabetes (HCC) - Primary   Relevant Orders   CMP14+EGFR   Combined hyperlipidemia associated with type 2 diabetes mellitus (HCC)   Relevant Orders   Lipid Panel w/o Chol/HDL Ratio   Diabetes mellitus with stage 3 chronic kidney disease (HCC)   Relevant Orders   Hemoglobin A1c   Other Visit Diagnoses     Screening for osteoporosis       Relevant Orders   DG Bone Density       Return in about 3 months (around 11/29/2023).   Total time spent: 30 minutes  Margaretann Loveless, MD  08/29/2023   This document may have been prepared by [facility] Voice Recognition software and as such may include unintentional dictation errors.

## 2023-09-04 ENCOUNTER — Other Ambulatory Visit: Payer: Medicare Other

## 2023-09-05 LAB — CBC WITH DIFFERENTIAL/PLATELET
Basophils Absolute: 0 10*3/uL (ref 0.0–0.2)
Basos: 1 %
EOS (ABSOLUTE): 0 10*3/uL (ref 0.0–0.4)
Eos: 1 %
Hematocrit: 37 % (ref 34.0–46.6)
Hemoglobin: 12 g/dL (ref 11.1–15.9)
Immature Grans (Abs): 0 10*3/uL (ref 0.0–0.1)
Immature Granulocytes: 0 %
Lymphocytes Absolute: 1.4 10*3/uL (ref 0.7–3.1)
Lymphs: 24 %
MCH: 31.3 pg (ref 26.6–33.0)
MCHC: 32.4 g/dL (ref 31.5–35.7)
MCV: 97 fL (ref 79–97)
Monocytes Absolute: 0.5 10*3/uL (ref 0.1–0.9)
Monocytes: 9 %
Neutrophils Absolute: 3.8 10*3/uL (ref 1.4–7.0)
Neutrophils: 65 %
Platelets: 244 10*3/uL (ref 150–450)
RBC: 3.83 x10E6/uL (ref 3.77–5.28)
RDW: 13.3 % (ref 11.7–15.4)
WBC: 5.8 10*3/uL (ref 3.4–10.8)

## 2023-09-05 LAB — LIPID PANEL W/O CHOL/HDL RATIO
Cholesterol, Total: 144 mg/dL (ref 100–199)
HDL: 60 mg/dL (ref >39)
LDL Chol Calc (NIH): 72 mg/dL (ref 0–99)
Triglycerides: 58 mg/dL (ref 0–149)
VLDL Cholesterol Cal: 12 mg/dL (ref 5–40)

## 2023-09-05 LAB — CMP14+EGFR
ALT: 24 [IU]/L (ref 0–32)
AST: 27 [IU]/L (ref 0–40)
Albumin: 4.4 g/dL (ref 3.8–4.8)
Alkaline Phosphatase: 106 [IU]/L (ref 44–121)
BUN/Creatinine Ratio: 18 (ref 12–28)
BUN: 46 mg/dL — ABNORMAL HIGH (ref 8–27)
Bilirubin Total: 0.4 mg/dL (ref 0.0–1.2)
CO2: 23 mmol/L (ref 20–29)
Calcium: 9.5 mg/dL (ref 8.7–10.3)
Chloride: 107 mmol/L — ABNORMAL HIGH (ref 96–106)
Creatinine, Ser: 2.57 mg/dL — ABNORMAL HIGH (ref 0.57–1.00)
Globulin, Total: 2.7 g/dL (ref 1.5–4.5)
Glucose: 65 mg/dL — ABNORMAL LOW (ref 70–99)
Potassium: 4.9 mmol/L (ref 3.5–5.2)
Sodium: 145 mmol/L — ABNORMAL HIGH (ref 134–144)
Total Protein: 7.1 g/dL (ref 6.0–8.5)
eGFR: 18 mL/min/{1.73_m2} — ABNORMAL LOW (ref >59)

## 2023-09-05 LAB — HEMOGLOBIN A1C
Est. average glucose Bld gHb Est-mCnc: 240 mg/dL
Hgb A1c MFr Bld: 10 % — ABNORMAL HIGH (ref 4.8–5.6)

## 2023-09-08 ENCOUNTER — Other Ambulatory Visit: Payer: Self-pay | Admitting: Internal Medicine

## 2023-09-08 DIAGNOSIS — E1165 Type 2 diabetes mellitus with hyperglycemia: Secondary | ICD-10-CM

## 2023-09-08 MED ORDER — LANTUS SOLOSTAR 100 UNIT/ML ~~LOC~~ SOPN: 28.0000 [IU] | PEN_INJECTOR | Freq: Every day | SUBCUTANEOUS | 2 refills | Status: DC

## 2023-09-18 ENCOUNTER — Other Ambulatory Visit: Payer: Self-pay | Admitting: Family

## 2023-09-18 DIAGNOSIS — R110 Nausea: Secondary | ICD-10-CM

## 2023-09-19 ENCOUNTER — Encounter: Payer: Self-pay | Admitting: Internal Medicine

## 2023-09-19 ENCOUNTER — Ambulatory Visit (INDEPENDENT_AMBULATORY_CARE_PROVIDER_SITE_OTHER): Payer: Medicare Other | Admitting: Internal Medicine

## 2023-09-19 VITALS — BP 128/60 | HR 83 | Ht 63.0 in | Wt 129.2 lb

## 2023-09-19 DIAGNOSIS — E782 Mixed hyperlipidemia: Secondary | ICD-10-CM

## 2023-09-19 DIAGNOSIS — E1169 Type 2 diabetes mellitus with other specified complication: Secondary | ICD-10-CM

## 2023-09-19 DIAGNOSIS — I152 Hypertension secondary to endocrine disorders: Secondary | ICD-10-CM

## 2023-09-19 DIAGNOSIS — E1159 Type 2 diabetes mellitus with other circulatory complications: Secondary | ICD-10-CM

## 2023-09-19 DIAGNOSIS — Z794 Long term (current) use of insulin: Secondary | ICD-10-CM | POA: Diagnosis not present

## 2023-09-19 DIAGNOSIS — E1165 Type 2 diabetes mellitus with hyperglycemia: Secondary | ICD-10-CM | POA: Diagnosis not present

## 2023-09-19 DIAGNOSIS — N1832 Chronic kidney disease, stage 3b: Secondary | ICD-10-CM

## 2023-09-19 LAB — GLUCOSE, POCT (MANUAL RESULT ENTRY): POC Glucose: 73 mg/dL (ref 70–99)

## 2023-09-19 MED ORDER — LOSARTAN POTASSIUM 50 MG PO TABS: 50.0000 mg | ORAL_TABLET | Freq: Every day | ORAL | 3 refills | Status: DC

## 2023-09-19 NOTE — Progress Notes (Signed)
Established Patient Office Visit  Subjective:  Patient ID: Patricia Cross, female    DOB: 04-Jun-1946  Age: 77 y.o. MRN: [id number]  Chief Complaint  Patient presents with   Follow-up    2 weeks    Patient comes in for follow-up and to discuss her lab results.  Her most recent hemoglobin A1c was up at 10.0.  Patient admits to dietary not discretions but however she has been more careful with her diet now.  Her Lantus was increased to 28 units and it has been shown an improvement in her fingerstick glucose.  Her blood pressure is also within normal limits these days.  Patient understands she has high creatinine and needs to be careful with her hydration.  She has an appointment to see the nephrologist within a week or so.    No other concerns at this time.   Past Medical History:  Diagnosis Date   Acute renal failure (ARF) (HCC)    Anemia    Diabetes mellitus without complication (HCC) 10/18/1999   High cholesterol    Hypertension     Past Surgical History:  Procedure Laterality Date   ABDOMINAL HYSTERECTOMY  1981   BREAST BIOPSY Left 2005   neg core   BREAST BIOPSY Right 2015   neg core-Fibroadenoma   COLONOSCOPY WITH PROPOFOL N/A 01/07/2016   Procedure: COLONOSCOPY WITH PROPOFOL;  Surgeon: Wallace Cullens, MD;  Location: [facility] ENDOSCOPY;  Service: Gastroenterology;  Laterality: N/A;   COLONOSCOPY WITH PROPOFOL N/A 05/09/2018   Procedure: COLONOSCOPY WITH PROPOFOL;  Surgeon: Toledo, Boykin Nearing, MD;  Location: [facility] ENDOSCOPY;  Service: Gastroenterology;  Laterality: N/A;   ESOPHAGOGASTRODUODENOSCOPY (EGD) WITH PROPOFOL N/A 05/09/2018   Procedure: ESOPHAGOGASTRODUODENOSCOPY (EGD) WITH PROPOFOL;  Surgeon: Toledo, Boykin Nearing, MD;  Location: [facility] ENDOSCOPY;  Service: Gastroenterology;  Laterality: N/A;   ESOPHAGOGASTRODUODENOSCOPY (EGD) WITH PROPOFOL N/A 08/05/2022   Procedure: ESOPHAGOGASTRODUODENOSCOPY (EGD) WITH PROPOFOL;  Surgeon: Midge Minium, MD;  Location: [facility] ENDOSCOPY;  Service:  Endoscopy;  Laterality: N/A;   MICROLARYNGOSCOPY WITH CO2 LASER AND EXCISION OF VOCAL CORD LESION  1979   OOPHORECTOMY     one left, don't know which one   TUBAL LIGATION  1975    Social History   Socioeconomic History   Marital status: Widowed    Spouse name: Not on file   Number of children: Not on file   Years of education: Not on file   Highest education level: Not on file  Occupational History   Not on file  Tobacco Use   Smoking status: Never   Smokeless tobacco: Never  Vaping Use   Vaping status: Never Used  Substance and Sexual Activity   Alcohol use: No   Drug use: No   Sexual activity: Not on file  Other Topics Concern   Not on file  Social History Narrative   Not on file   Social Determinants of Health   Financial Resource Strain: Not on file  Food Insecurity: No Food Insecurity (08/05/2022)   Hunger Vital Sign    Worried About Running Out of Food in the Last Year: Never true    Ran Out of Food in the Last Year: Never true  Transportation Needs: No Transportation Needs (08/05/2022)   PRAPARE - Administrator, Civil Service (Medical): No    Lack of Transportation (Non-Medical): No  Physical Activity: Not on file  Stress: Not on file  Social Connections: Not on file  Intimate Partner Violence: Not At Risk (08/05/2022)  Humiliation, Afraid, Rape, and Kick questionnaire    Fear of Current or Ex-Partner: No    Emotionally Abused: No    Physically Abused: No    Sexually Abused: No    Family History  Problem Relation Age of Onset   Hypertension Mother    Stroke Mother    Gout Father    Colon cancer Sister    Pancreatic cancer Daughter    Breast cancer Neg Hx     No Known Allergies  Outpatient Medications Prior to Visit  Medication Sig   amLODipine (NORVASC) 10 MG tablet Take 1 tablet (10 mg total) by mouth daily.   ASPIRIN 81 PO Aspirin 81 MG Oral Tablet QTY: 0 tablet Days: 0 Refills: 0  Written: 09/07/18 Patient Instructions:    FARXIGA 10 MG TABS tablet Take 10 mg by mouth daily.   ferrous sulfate 325 (65 FE) MG EC tablet Take 1 tablet (325 mg total) by mouth 2 (two) times daily with a meal.   glipiZIDE (GLUCOTROL XL) 2.5 MG 24 hr tablet Take 2.5 mg by mouth 2 (two) times daily.   hydrALAZINE (APRESOLINE) 50 MG tablet Take 1 tablet (50 mg total) by mouth in the morning and at bedtime.   insulin glargine (LANTUS SOLOSTAR) 100 UNIT/ML Solostar Pen Inject 28 Units into the skin at bedtime.   metoCLOPramide (REGLAN) 5 MG tablet Take 1 tablet by mouth twice daily   Multiple Vitamins-Minerals (CENTRUM SILVER) tablet Centrum Silver Oral Tablet QTY: 0 tablet Days: 0 Refills: 0  Written: 11/12/15 Patient Instructions:   pantoprazole (PROTONIX) 40 MG tablet Take 1 tablet (40 mg total) by mouth daily.   ReliOn Alcohol Swabs PADS 1 each by Does not apply route 2 (two) times daily.   rosuvastatin (CRESTOR) 40 MG tablet Take 1 tablet by mouth once daily   [DISCONTINUED] losartan (COZAAR) 25 MG tablet Take 25 mg by mouth daily.   No facility-administered medications prior to visit.    Review of Systems  Constitutional: Negative.  Negative for chills, fever, malaise/fatigue and weight loss.  HENT: Negative.  Negative for congestion and sinus pain.   Eyes: Negative.   Respiratory: Negative.  Negative for cough, shortness of breath and stridor.   Cardiovascular: Negative.  Negative for chest pain, palpitations and leg swelling.  Gastrointestinal: Negative.  Negative for abdominal pain, constipation, diarrhea, heartburn, nausea and vomiting.  Genitourinary: Negative.  Negative for dysuria and flank pain.  Musculoskeletal: Negative.  Negative for joint pain and myalgias.  Neurological:  Negative for dizziness, tingling, tremors, sensory change and headaches.  Endo/Heme/Allergies: Negative.   Psychiatric/Behavioral: Negative.  Negative for depression and suicidal ideas. The patient is not nervous/anxious.        Objective:   BP  128/60   Pulse 83   Ht 5\' 3"  (1.6 m)   Wt 129 lb 3.2 oz (58.6 kg)   SpO2 97%   BMI 22.89 kg/m   Vitals:   09/19/23 1001  BP: 128/60  Pulse: 83  Height: 5\' 3"  (1.6 m)  Weight: 129 lb 3.2 oz (58.6 kg)  SpO2: 97%  BMI (Calculated): 22.89    Physical Exam Vitals and nursing note reviewed.  Constitutional:      Appearance: Normal appearance.  HENT:     Head: Normocephalic and atraumatic.     Nose: Nose normal.     Mouth/Throat:     Mouth: Mucous membranes are moist.     Pharynx: Oropharynx is clear.  Eyes:     Conjunctiva/sclera: Conjunctivae  normal.     Pupils: Pupils are equal, round, and reactive to light.  Cardiovascular:     Rate and Rhythm: Normal rate and regular rhythm.     Pulses: Normal pulses.     Heart sounds: Normal heart sounds. No murmur heard. Pulmonary:     Effort: Pulmonary effort is normal.     Breath sounds: Normal breath sounds. No wheezing.  Abdominal:     General: Bowel sounds are normal.     Palpations: Abdomen is soft.     Tenderness: There is no abdominal tenderness. There is no right CVA tenderness or left CVA tenderness.  Musculoskeletal:        General: Normal range of motion.     Cervical back: Normal range of motion.     Right lower leg: No edema.     Left lower leg: No edema.  Skin:    General: Skin is warm and dry.  Neurological:     General: No focal deficit present.     Mental Status: She is alert and oriented to person, place, and time.  Psychiatric:        Mood and Affect: Mood normal.        Behavior: Behavior normal.      Results for orders placed or performed in visit on 09/19/23  POCT Glucose (CBG)  Result Value Ref Range   POC Glucose 73 70 - 99 mg/dl    Recent Results (from the past 2160 hour(s))  POCT CBG (Fasting - Glucose)     Status: None   Collection Time: 07/11/23 10:04 AM  Result Value Ref Range   Glucose Fasting, POC 88 70 - 99 mg/dL  POCT CBG (Fasting - Glucose)     Status: Abnormal   Collection  Time: 07/25/23 10:00 AM  Result Value Ref Range   Glucose Fasting, POC 146 (A) 70 - 99 mg/dL  POCT Glucose (CBG)     Status: Abnormal   Collection Time: 08/29/23 10:29 AM  Result Value Ref Range   POC Glucose 130 (A) 70 - 99 mg/dl  CBC with Diff     Status: None   Collection Time: 09/04/23  9:57 AM  Result Value Ref Range   WBC 5.8 3.4 - 10.8 x10E3/uL   RBC 3.83 3.77 - 5.28 x10E6/uL   Hemoglobin 12.0 11.1 - 15.9 g/dL   Hematocrit 41.6 60.6 - 46.6 %   MCV 97 79 - 97 fL   MCH 31.3 26.6 - 33.0 pg   MCHC 32.4 31.5 - 35.7 g/dL   RDW 30.1 60.1 - 09.3 %   Platelets 244 150 - 450 x10E3/uL   Neutrophils 65 Not Estab. %   Lymphs 24 Not Estab. %   Monocytes 9 Not Estab. %   Eos 1 Not Estab. %   Basos 1 Not Estab. %   Neutrophils Absolute 3.8 1.4 - 7.0 x10E3/uL   Lymphocytes Absolute 1.4 0.7 - 3.1 x10E3/uL   Monocytes Absolute 0.5 0.1 - 0.9 x10E3/uL   EOS (ABSOLUTE) 0.0 0.0 - 0.4 x10E3/uL   Basophils Absolute 0.0 0.0 - 0.2 x10E3/uL   Immature Granulocytes 0 Not Estab. %   Immature Grans (Abs) 0.0 0.0 - 0.1 x10E3/uL  CMP14+EGFR     Status: Abnormal   Collection Time: 09/04/23  9:57 AM  Result Value Ref Range   Glucose 65 (L) 70 - 99 mg/dL   BUN 46 (H) 8 - 27 mg/dL   Creatinine, Ser 2.35 (H) 0.57 - 1.00 mg/dL  eGFR 18 (L) >59 mL/min/1.73   BUN/Creatinine Ratio 18 12 - 28   Sodium 145 (H) 134 - 144 mmol/L   Potassium 4.9 3.5 - 5.2 mmol/L   Chloride 107 (H) 96 - 106 mmol/L   CO2 23 20 - 29 mmol/L   Calcium 9.5 8.7 - 10.3 mg/dL   Total Protein 7.1 6.0 - 8.5 g/dL   Albumin 4.4 3.8 - 4.8 g/dL   Globulin, Total 2.7 1.5 - 4.5 g/dL   Bilirubin Total 0.4 0.0 - 1.2 mg/dL   Alkaline Phosphatase 106 44 - 121 IU/L   AST 27 0 - 40 IU/L   ALT 24 0 - 32 IU/L  Hemoglobin A1c     Status: Abnormal   Collection Time: 09/04/23  9:57 AM  Result Value Ref Range   Hgb A1c MFr Bld 10.0 (H) 4.8 - 5.6 %    Comment:          Prediabetes: 5.7 - 6.4          Diabetes: >6.4          Glycemic control for  adults with diabetes: <7.0    Est. average glucose Bld gHb Est-mCnc 240 mg/dL  Lipid Panel w/o Chol/HDL Ratio     Status: None   Collection Time: 09/04/23  9:57 AM  Result Value Ref Range   Cholesterol, Total 144 100 - 199 mg/dL   Triglycerides 58 0 - 149 mg/dL   HDL 60 >16 mg/dL   VLDL Cholesterol Cal 12 5 - 40 mg/dL   LDL Chol Calc (NIH) 72 0 - 99 mg/dL  POCT Glucose (CBG)     Status: Normal   Collection Time: 09/19/23 10:05 AM  Result Value Ref Range   POC Glucose 73 70 - 99 mg/dl      Assessment & Plan:  Continue current medications for now. Strict diet control emphasized. Problem List Items Addressed This Visit     Type 2 diabetes mellitus with hyperglycemia, without long-term current use of insulin (HCC)   Relevant Medications   losartan (COZAAR) 50 MG tablet   Stage 3b chronic kidney disease (HCC)   Hypertension associated with diabetes (HCC) - Primary   Relevant Medications   losartan (COZAAR) 50 MG tablet   Combined hyperlipidemia associated with type 2 diabetes mellitus (HCC)   Relevant Medications   losartan (COZAAR) 50 MG tablet    Follow up as scheduled.  Total time spent: 30 minutes  Margaretann Loveless, MD  09/19/2023   This document may have been prepared by [facility] Voice Recognition software and as such may include unintentional dictation errors.

## 2023-09-21 ENCOUNTER — Other Ambulatory Visit: Payer: Self-pay | Admitting: Family

## 2023-09-21 DIAGNOSIS — R110 Nausea: Secondary | ICD-10-CM

## 2023-10-17 ENCOUNTER — Other Ambulatory Visit: Payer: Medicare Other

## 2023-11-13 ENCOUNTER — Other Ambulatory Visit: Payer: Self-pay | Admitting: Internal Medicine

## 2023-11-13 DIAGNOSIS — R110 Nausea: Secondary | ICD-10-CM

## 2023-11-30 ENCOUNTER — Encounter: Payer: Self-pay | Admitting: Internal Medicine

## 2023-11-30 ENCOUNTER — Ambulatory Visit: Payer: Medicare Other | Admitting: Internal Medicine

## 2023-11-30 VITALS — BP 134/64 | HR 78 | Ht 63.0 in | Wt 129.0 lb

## 2023-11-30 DIAGNOSIS — E1159 Type 2 diabetes mellitus with other circulatory complications: Secondary | ICD-10-CM | POA: Diagnosis not present

## 2023-11-30 DIAGNOSIS — I152 Hypertension secondary to endocrine disorders: Secondary | ICD-10-CM

## 2023-11-30 DIAGNOSIS — E782 Mixed hyperlipidemia: Secondary | ICD-10-CM

## 2023-11-30 DIAGNOSIS — Z794 Long term (current) use of insulin: Secondary | ICD-10-CM

## 2023-11-30 DIAGNOSIS — E1165 Type 2 diabetes mellitus with hyperglycemia: Secondary | ICD-10-CM | POA: Diagnosis not present

## 2023-11-30 DIAGNOSIS — E1169 Type 2 diabetes mellitus with other specified complication: Secondary | ICD-10-CM

## 2023-11-30 DIAGNOSIS — N1832 Chronic kidney disease, stage 3b: Secondary | ICD-10-CM

## 2023-11-30 LAB — POCT CBG (FASTING - GLUCOSE)-MANUAL ENTRY: Glucose Fasting, POC: 69 mg/dL — AB (ref 70–99)

## 2023-11-30 NOTE — Progress Notes (Signed)
Established Patient Office Visit  Subjective:  Patient ID: Patricia Cross, female    DOB: 1945-11-12  Age: 78 y.o. MRN: [id number]  Chief Complaint  Patient presents with   Follow-up    3 month follow up    Patient comes in for follow-up today.  Her initial blood pressure reading is high as she has not taken her medications yet.  She is fasting for blood work but her labs are not due for another week.  Repeat blood pressure looks better and her sugars are low, patient ate a candy while in the office.  However patient reports that her fasting blood sugars are always high, and she is currently taking 28 units of Lantus every morning.  Her last hemoglobin A1c was 10.0.  Would like to consult a different endocrinologist.  Meanwhile patient advised to increase her Lantus, will titrate slowly.  Needs to continue monitoring her blood sugar.    No other concerns at this time.   Past Medical History:  Diagnosis Date   Acute renal failure (ARF) (HCC)    Anemia    Diabetes mellitus without complication (HCC) 10/18/1999   High cholesterol    Hypertension     Past Surgical History:  Procedure Laterality Date   ABDOMINAL HYSTERECTOMY  1981   BREAST BIOPSY Left 2005   neg core   BREAST BIOPSY Right 2015   neg core-Fibroadenoma   COLONOSCOPY WITH PROPOFOL N/A 01/07/2016   Procedure: COLONOSCOPY WITH PROPOFOL;  Surgeon: Wallace Cullens, MD;  Location: [facility] ENDOSCOPY;  Service: Gastroenterology;  Laterality: N/A;   COLONOSCOPY WITH PROPOFOL N/A 05/09/2018   Procedure: COLONOSCOPY WITH PROPOFOL;  Surgeon: Toledo, Boykin Nearing, MD;  Location: [facility] ENDOSCOPY;  Service: Gastroenterology;  Laterality: N/A;   ESOPHAGOGASTRODUODENOSCOPY (EGD) WITH PROPOFOL N/A 05/09/2018   Procedure: ESOPHAGOGASTRODUODENOSCOPY (EGD) WITH PROPOFOL;  Surgeon: Toledo, Boykin Nearing, MD;  Location: [facility] ENDOSCOPY;  Service: Gastroenterology;  Laterality: N/A;   ESOPHAGOGASTRODUODENOSCOPY (EGD) WITH PROPOFOL N/A 08/05/2022    Procedure: ESOPHAGOGASTRODUODENOSCOPY (EGD) WITH PROPOFOL;  Surgeon: Midge Minium, MD;  Location: [facility] ENDOSCOPY;  Service: Endoscopy;  Laterality: N/A;   MICROLARYNGOSCOPY WITH CO2 LASER AND EXCISION OF VOCAL CORD LESION  1979   OOPHORECTOMY     one left, don't know which one   TUBAL LIGATION  1975    Social History   Socioeconomic History   Marital status: Widowed    Spouse name: Not on file   Number of children: Not on file   Years of education: Not on file   Highest education level: Not on file  Occupational History   Not on file  Tobacco Use   Smoking status: Never   Smokeless tobacco: Never  Vaping Use   Vaping status: Never Used  Substance and Sexual Activity   Alcohol use: No   Drug use: No   Sexual activity: Not on file  Other Topics Concern   Not on file  Social History Narrative   Not on file   Social Drivers of Health   Financial Resource Strain: Not on file  Food Insecurity: No Food Insecurity (08/05/2022)   Hunger Vital Sign    Worried About Running Out of Food in the Last Year: Never true    Ran Out of Food in the Last Year: Never true  Transportation Needs: No Transportation Needs (08/05/2022)   PRAPARE - Administrator, Civil Service (Medical): No    Lack of Transportation (Non-Medical): No  Physical Activity: Not on file  Stress:  Not on file  Social Connections: Not on file  Intimate Partner Violence: Not At Risk (08/05/2022)   Humiliation, Afraid, Rape, and Kick questionnaire    Fear of Current or Ex-Partner: No    Emotionally Abused: No    Physically Abused: No    Sexually Abused: No    Family History  Problem Relation Age of Onset   Hypertension Mother    Stroke Mother    Gout Father    Colon cancer Sister    Pancreatic cancer Daughter    Breast cancer Neg Hx     No Known Allergies  Outpatient Medications Prior to Visit  Medication Sig   amLODipine (NORVASC) 10 MG tablet Take 1 tablet (10 mg total) by mouth daily.    ASPIRIN 81 PO Aspirin 81 MG Oral Tablet QTY: 0 tablet Days: 0 Refills: 0  Written: 09/07/18 Patient Instructions:   FARXIGA 10 MG TABS tablet Take 10 mg by mouth daily.   ferrous sulfate 325 (65 FE) MG EC tablet Take 1 tablet (325 mg total) by mouth 2 (two) times daily with a meal.   glipiZIDE (GLUCOTROL XL) 2.5 MG 24 hr tablet Take 2.5 mg by mouth 2 (two) times daily.   hydrALAZINE (APRESOLINE) 50 MG tablet Take 1 tablet (50 mg total) by mouth in the morning and at bedtime.   insulin glargine (LANTUS SOLOSTAR) 100 UNIT/ML Solostar Pen Inject 28 Units into the skin at bedtime.   losartan (COZAAR) 50 MG tablet Take 1 tablet (50 mg total) by mouth daily.   metoCLOPramide (REGLAN) 5 MG tablet Take 1 tablet by mouth twice daily   Multiple Vitamins-Minerals (CENTRUM SILVER) tablet Centrum Silver Oral Tablet QTY: 0 tablet Days: 0 Refills: 0  Written: 11/12/15 Patient Instructions:   pantoprazole (PROTONIX) 40 MG tablet Take 1 tablet (40 mg total) by mouth daily.   ReliOn Alcohol Swabs PADS 1 each by Does not apply route 2 (two) times daily.   rosuvastatin (CRESTOR) 40 MG tablet Take 1 tablet by mouth once daily   No facility-administered medications prior to visit.    Review of Systems  Constitutional: Negative.  Negative for chills, fever, malaise/fatigue and weight loss.  HENT: Negative.  Negative for congestion, ear discharge, ear pain, nosebleeds and sinus pain.   Eyes: Negative.   Respiratory: Negative.  Negative for cough and shortness of breath.   Cardiovascular: Negative.  Negative for chest pain, palpitations and leg swelling.  Gastrointestinal: Negative.  Negative for abdominal pain, constipation, diarrhea, heartburn, nausea and vomiting.  Genitourinary: Negative.  Negative for dysuria and flank pain.  Musculoskeletal: Negative.  Negative for joint pain and myalgias.  Neurological: Negative.  Negative for dizziness, tingling, tremors, sensory change, speech change and headaches.   Endo/Heme/Allergies: Negative.   Psychiatric/Behavioral: Negative.  Negative for depression and suicidal ideas. The patient is not nervous/anxious.        Objective:   BP 134/64   Pulse 78   Ht 5\' 3"  (1.6 m)   Wt 129 lb (58.5 kg)   SpO2 96%   BMI 22.85 kg/m   Vitals:   11/30/23 0950 11/30/23 1026  BP: (!) 160/60 134/64  Pulse: 78   Height: 5\' 3"  (1.6 m)   Weight: 129 lb (58.5 kg)   SpO2: 96%   BMI (Calculated): 22.86     Physical Exam Vitals and nursing note reviewed.  Constitutional:      Appearance: Normal appearance.  HENT:     Head: Normocephalic and atraumatic.  Nose: Nose normal.     Mouth/Throat:     Mouth: Mucous membranes are moist.     Pharynx: Oropharynx is clear.  Eyes:     Conjunctiva/sclera: Conjunctivae normal.     Pupils: Pupils are equal, round, and reactive to light.  Cardiovascular:     Rate and Rhythm: Normal rate and regular rhythm.     Pulses: Normal pulses.     Heart sounds: Normal heart sounds. No murmur heard. Pulmonary:     Effort: Pulmonary effort is normal.     Breath sounds: Normal breath sounds. No wheezing.  Abdominal:     General: Bowel sounds are normal.     Palpations: Abdomen is soft.     Tenderness: There is no abdominal tenderness. There is no right CVA tenderness or left CVA tenderness.  Musculoskeletal:        General: Normal range of motion.     Cervical back: Normal range of motion.     Right lower leg: No edema.     Left lower leg: No edema.  Skin:    General: Skin is warm and dry.  Neurological:     General: No focal deficit present.     Mental Status: She is alert and oriented to person, place, and time.  Psychiatric:        Mood and Affect: Mood normal.        Behavior: Behavior normal.      Results for orders placed or performed in visit on 11/30/23  POCT CBG (Fasting - Glucose)  Result Value Ref Range   Glucose Fasting, POC 69 (A) 70 - 99 mg/dL    Recent Results (from the past 2160 hours)   CBC with Diff     Status: None   Collection Time: 09/04/23  9:57 AM  Result Value Ref Range   WBC 5.8 3.4 - 10.8 x10E3/uL   RBC 3.83 3.77 - 5.28 x10E6/uL   Hemoglobin 12.0 11.1 - 15.9 g/dL   Hematocrit 16.1 09.6 - 46.6 %   MCV 97 79 - 97 fL   MCH 31.3 26.6 - 33.0 pg   MCHC 32.4 31.5 - 35.7 g/dL   RDW 04.5 40.9 - 81.1 %   Platelets 244 150 - 450 x10E3/uL   Neutrophils 65 Not Estab. %   Lymphs 24 Not Estab. %   Monocytes 9 Not Estab. %   Eos 1 Not Estab. %   Basos 1 Not Estab. %   Neutrophils Absolute 3.8 1.4 - 7.0 x10E3/uL   Lymphocytes Absolute 1.4 0.7 - 3.1 x10E3/uL   Monocytes Absolute 0.5 0.1 - 0.9 x10E3/uL   EOS (ABSOLUTE) 0.0 0.0 - 0.4 x10E3/uL   Basophils Absolute 0.0 0.0 - 0.2 x10E3/uL   Immature Granulocytes 0 Not Estab. %   Immature Grans (Abs) 0.0 0.0 - 0.1 x10E3/uL  CMP14+EGFR     Status: Abnormal   Collection Time: 09/04/23  9:57 AM  Result Value Ref Range   Glucose 65 (L) 70 - 99 mg/dL   BUN 46 (H) 8 - 27 mg/dL   Creatinine, Ser 9.14 (H) 0.57 - 1.00 mg/dL   eGFR 18 (L) >78 GN/FAO/1.30   BUN/Creatinine Ratio 18 12 - 28   Sodium 145 (H) 134 - 144 mmol/L   Potassium 4.9 3.5 - 5.2 mmol/L   Chloride 107 (H) 96 - 106 mmol/L   CO2 23 20 - 29 mmol/L   Calcium 9.5 8.7 - 10.3 mg/dL   Total Protein 7.1 6.0 - 8.5 g/dL  Albumin 4.4 3.8 - 4.8 g/dL   Globulin, Total 2.7 1.5 - 4.5 g/dL   Bilirubin Total 0.4 0.0 - 1.2 mg/dL   Alkaline Phosphatase 106 44 - 121 IU/L   AST 27 0 - 40 IU/L   ALT 24 0 - 32 IU/L  Hemoglobin A1c     Status: Abnormal   Collection Time: 09/04/23  9:57 AM  Result Value Ref Range   Hgb A1c MFr Bld 10.0 (H) 4.8 - 5.6 %    Comment:          Prediabetes: 5.7 - 6.4          Diabetes: >6.4          Glycemic control for adults with diabetes: <7.0    Est. average glucose Bld gHb Est-mCnc 240 mg/dL  Lipid Panel w/o Chol/HDL Ratio     Status: None   Collection Time: 09/04/23  9:57 AM  Result Value Ref Range   Cholesterol, Total 144 100 - 199 mg/dL    Triglycerides 58 0 - 149 mg/dL   HDL 60 >16 mg/dL   VLDL Cholesterol Cal 12 5 - 40 mg/dL   LDL Chol Calc (NIH) 72 0 - 99 mg/dL  POCT Glucose (CBG)     Status: Normal   Collection Time: 09/19/23 10:05 AM  Result Value Ref Range   POC Glucose 73 70 - 99 mg/dl  POCT CBG (Fasting - Glucose)     Status: Abnormal   Collection Time: 11/30/23 10:27 AM  Result Value Ref Range   Glucose Fasting, POC 69 (A) 70 - 99 mg/dL      Assessment & Plan:  Continue current medications.  Referral sent to endocrinologist.  Check labs next week. Problem List Items Addressed This Visit     Type 2 diabetes mellitus with hyperglycemia, with long-term current use of insulin (HCC)   Relevant Orders   Ambulatory referral to Endocrinology   Hemoglobin A1c   POCT CBG (Fasting - Glucose) (Completed)   Stage 3b chronic kidney disease (HCC)   Relevant Orders   CBC with Diff   Hypertension associated with diabetes (HCC) - Primary   Relevant Orders   CMP14+EGFR   Combined hyperlipidemia associated with type 2 diabetes mellitus (HCC)   Relevant Orders   Lipid Panel w/o Chol/HDL Ratio    Return in about 3 weeks (around 12/21/2023).   Total time spent: 30 minutes  Margaretann Loveless, MD  11/30/2023   This document may have been prepared by [facility] Voice Recognition software and as such may include unintentional dictation errors.

## 2023-12-15 ENCOUNTER — Other Ambulatory Visit: Payer: Self-pay | Admitting: Internal Medicine

## 2023-12-15 DIAGNOSIS — I10 Essential (primary) hypertension: Secondary | ICD-10-CM

## 2023-12-29 ENCOUNTER — Ambulatory Visit (INDEPENDENT_AMBULATORY_CARE_PROVIDER_SITE_OTHER): Payer: Medicare Other | Admitting: Internal Medicine

## 2023-12-29 ENCOUNTER — Encounter: Payer: Self-pay | Admitting: Internal Medicine

## 2023-12-29 VITALS — BP 150/70 | HR 74 | Ht 63.0 in | Wt 127.2 lb

## 2023-12-29 DIAGNOSIS — I152 Hypertension secondary to endocrine disorders: Secondary | ICD-10-CM

## 2023-12-29 DIAGNOSIS — Z794 Long term (current) use of insulin: Secondary | ICD-10-CM | POA: Diagnosis not present

## 2023-12-29 DIAGNOSIS — E1169 Type 2 diabetes mellitus with other specified complication: Secondary | ICD-10-CM | POA: Diagnosis not present

## 2023-12-29 DIAGNOSIS — E1165 Type 2 diabetes mellitus with hyperglycemia: Secondary | ICD-10-CM

## 2023-12-29 DIAGNOSIS — E782 Mixed hyperlipidemia: Secondary | ICD-10-CM

## 2023-12-29 DIAGNOSIS — E1159 Type 2 diabetes mellitus with other circulatory complications: Secondary | ICD-10-CM | POA: Diagnosis not present

## 2023-12-29 DIAGNOSIS — Z1231 Encounter for screening mammogram for malignant neoplasm of breast: Secondary | ICD-10-CM

## 2023-12-29 LAB — POCT CBG (FASTING - GLUCOSE)-MANUAL ENTRY: Glucose Fasting, POC: 71 mg/dL (ref 70–99)

## 2023-12-29 NOTE — Progress Notes (Signed)
 Established Patient Office Visit  Subjective:  Patient ID: Patricia Cross, female    DOB: 09/07/1946  Age: 78 y.o. MRN: [id number]  Chief Complaint  Patient presents with   Follow-up    3 week follow up    Patient is here for her follow-up today.  She is generally feeling well and has no new complaints.  Her blood pressure is elevated as she has not taken her medicines yet.  She is fasting for blood work.  Her mammogram is due next month we will send in an order.    No other concerns at this time.   Past Medical History:  Diagnosis Date   Acute renal failure (ARF) (HCC)    Anemia    Diabetes mellitus without complication (HCC) 10/18/1999   High cholesterol    Hypertension     Past Surgical History:  Procedure Laterality Date   ABDOMINAL HYSTERECTOMY  1981   BREAST BIOPSY Left 2005   neg core   BREAST BIOPSY Right 2015   neg core-Fibroadenoma   COLONOSCOPY WITH PROPOFOL N/A 01/07/2016   Procedure: COLONOSCOPY WITH PROPOFOL;  Surgeon: Wallace Cullens, MD;  Location: [facility] ENDOSCOPY;  Service: Gastroenterology;  Laterality: N/A;   COLONOSCOPY WITH PROPOFOL N/A 05/09/2018   Procedure: COLONOSCOPY WITH PROPOFOL;  Surgeon: Toledo, Boykin Nearing, MD;  Location: [facility] ENDOSCOPY;  Service: Gastroenterology;  Laterality: N/A;   ESOPHAGOGASTRODUODENOSCOPY (EGD) WITH PROPOFOL N/A 05/09/2018   Procedure: ESOPHAGOGASTRODUODENOSCOPY (EGD) WITH PROPOFOL;  Surgeon: Toledo, Boykin Nearing, MD;  Location: [facility] ENDOSCOPY;  Service: Gastroenterology;  Laterality: N/A;   ESOPHAGOGASTRODUODENOSCOPY (EGD) WITH PROPOFOL N/A 08/05/2022   Procedure: ESOPHAGOGASTRODUODENOSCOPY (EGD) WITH PROPOFOL;  Surgeon: Midge Minium, MD;  Location: [facility] ENDOSCOPY;  Service: Endoscopy;  Laterality: N/A;   MICROLARYNGOSCOPY WITH CO2 LASER AND EXCISION OF VOCAL CORD LESION  1979   OOPHORECTOMY     one left, don't know which one   TUBAL LIGATION  1975    Social History   Socioeconomic History   Marital status: Widowed     Spouse name: Not on file   Number of children: Not on file   Years of education: Not on file   Highest education level: Not on file  Occupational History   Not on file  Tobacco Use   Smoking status: Never   Smokeless tobacco: Never  Vaping Use   Vaping status: Never Used  Substance and Sexual Activity   Alcohol use: No   Drug use: No   Sexual activity: Not on file  Other Topics Concern   Not on file  Social History Narrative   Not on file   Social Drivers of Health   Financial Resource Strain: Not on file  Food Insecurity: No Food Insecurity (08/05/2022)   Hunger Vital Sign    Worried About Running Out of Food in the Last Year: Never true    Ran Out of Food in the Last Year: Never true  Transportation Needs: No Transportation Needs (08/05/2022)   PRAPARE - Administrator, Civil Service (Medical): No    Lack of Transportation (Non-Medical): No  Physical Activity: Not on file  Stress: Not on file  Social Connections: Not on file  Intimate Partner Violence: Not At Risk (08/05/2022)   Humiliation, Afraid, Rape, and Kick questionnaire    Fear of Current or Ex-Partner: No    Emotionally Abused: No    Physically Abused: No    Sexually Abused: No    Family History  Problem Relation Age of  Onset   Hypertension Mother    Stroke Mother    Gout Father    Colon cancer Sister    Pancreatic cancer Daughter    Breast cancer Neg Hx     No Known Allergies  Outpatient Medications Prior to Visit  Medication Sig   amLODipine (NORVASC) 10 MG tablet Take 1 tablet (10 mg total) by mouth daily.   ASPIRIN 81 PO Aspirin 81 MG Oral Tablet QTY: 0 tablet Days: 0 Refills: 0  Written: 09/07/18 Patient Instructions:   FARXIGA 10 MG TABS tablet Take 10 mg by mouth daily.   ferrous sulfate 325 (65 FE) MG EC tablet Take 1 tablet (325 mg total) by mouth 2 (two) times daily with a meal.   glipiZIDE (GLUCOTROL XL) 2.5 MG 24 hr tablet Take 2.5 mg by mouth 2 (two) times daily.    hydrALAZINE (APRESOLINE) 50 MG tablet TAKE 1 TABLET BY MOUTH IN THE MORNING AND AT BEDTIME   losartan (COZAAR) 50 MG tablet Take 1 tablet (50 mg total) by mouth daily.   metoCLOPramide (REGLAN) 5 MG tablet Take 1 tablet by mouth twice daily   Multiple Vitamins-Minerals (CENTRUM SILVER) tablet Centrum Silver Oral Tablet QTY: 0 tablet Days: 0 Refills: 0  Written: 11/12/15 Patient Instructions:   pantoprazole (PROTONIX) 40 MG tablet Take 1 tablet (40 mg total) by mouth daily.   ReliOn Alcohol Swabs PADS 1 each by Does not apply route 2 (two) times daily.   rosuvastatin (CRESTOR) 40 MG tablet Take 1 tablet by mouth once daily   insulin glargine (LANTUS SOLOSTAR) 100 UNIT/ML Solostar Pen Inject 28 Units into the skin at bedtime.   No facility-administered medications prior to visit.    Review of Systems  Constitutional: Negative.  Negative for chills, fever, malaise/fatigue and weight loss.  HENT: Negative.  Negative for sinus pain.   Eyes: Negative.   Respiratory: Negative.  Negative for cough, shortness of breath and stridor.   Cardiovascular: Negative.  Negative for chest pain, palpitations and leg swelling.  Gastrointestinal: Negative.  Negative for abdominal pain, constipation, diarrhea, heartburn, nausea and vomiting.  Genitourinary: Negative.  Negative for dysuria and flank pain.  Musculoskeletal: Negative.  Negative for joint pain and myalgias.  Skin: Negative.   Neurological: Negative.  Negative for dizziness, tingling, tremors and headaches.  Endo/Heme/Allergies: Negative.   Psychiatric/Behavioral: Negative.  Negative for depression and suicidal ideas. The patient is not nervous/anxious.        Objective:   BP (!) 150/70   Pulse 74   Ht 5\' 3"  (1.6 m)   Wt 127 lb 3.2 oz (57.7 kg)   SpO2 98%   BMI 22.53 kg/m   Vitals:   12/29/23 0959  BP: (!) 150/70  Pulse: 74  Height: 5\' 3"  (1.6 m)  Weight: 127 lb 3.2 oz (57.7 kg)  SpO2: 98%  BMI (Calculated): 22.54    Physical  Exam Vitals and nursing note reviewed.  Constitutional:      Appearance: Normal appearance.  HENT:     Head: Normocephalic and atraumatic.     Nose: Nose normal.     Mouth/Throat:     Mouth: Mucous membranes are moist.     Pharynx: Oropharynx is clear.  Eyes:     Conjunctiva/sclera: Conjunctivae normal.     Pupils: Pupils are equal, round, and reactive to light.  Cardiovascular:     Rate and Rhythm: Normal rate and regular rhythm.     Pulses: Normal pulses.     Heart sounds:  Normal heart sounds. No murmur heard. Pulmonary:     Effort: Pulmonary effort is normal.     Breath sounds: Normal breath sounds. No wheezing.  Abdominal:     General: Bowel sounds are normal.     Palpations: Abdomen is soft.     Tenderness: There is no abdominal tenderness. There is no right CVA tenderness or left CVA tenderness.  Musculoskeletal:        General: Normal range of motion.     Cervical back: Normal range of motion.     Right lower leg: No edema.     Left lower leg: No edema.  Skin:    General: Skin is warm and dry.  Neurological:     General: No focal deficit present.     Mental Status: She is alert and oriented to person, place, and time.  Psychiatric:        Mood and Affect: Mood normal.        Behavior: Behavior normal.      Results for orders placed or performed in visit on 12/29/23  POCT CBG (Fasting - Glucose)  Result Value Ref Range   Glucose Fasting, POC 71 70 - 99 mg/dL    Recent Results (from the past 2160 hours)  POCT CBG (Fasting - Glucose)     Status: Abnormal   Collection Time: 11/30/23 10:27 AM  Result Value Ref Range   Glucose Fasting, POC 69 (A) 70 - 99 mg/dL  POCT CBG (Fasting - Glucose)     Status: None   Collection Time: 12/29/23 10:04 AM  Result Value Ref Range   Glucose Fasting, POC 71 70 - 99 mg/dL      Assessment & Plan:  Advised to monitor her blood pressure at home.  Continue all the medications.  Blood work today.  Not able to give a urine  specimen today for microalbumin.  Will check it next time. Problem List Items Addressed This Visit     Type 2 diabetes mellitus with hyperglycemia, with long-term current use of insulin (HCC)   Relevant Orders   POCT CBG (Fasting - Glucose) (Completed)   Hemoglobin A1c   Hypertension associated with diabetes (HCC) - Primary   Relevant Orders   CMP14+EGFR   CBC with Diff   Combined hyperlipidemia associated with type 2 diabetes mellitus (HCC)   Relevant Orders   CMP14+EGFR   Lipid Panel w/o Chol/HDL Ratio   Other Visit Diagnoses       Breast cancer screening by mammogram       Relevant Orders   MM 3D SCREENING MAMMOGRAM BILATERAL BREAST       Return in about 3 months (around 03/30/2024).   Total time spent: 30 minutes  Margaretann Loveless, MD  12/29/2023   This document may have been prepared by [facility] Voice Recognition software and as such may include unintentional dictation errors.

## 2023-12-30 LAB — LIPID PANEL W/O CHOL/HDL RATIO
Cholesterol, Total: 145 mg/dL (ref 100–199)
HDL: 59 mg/dL (ref >39)
LDL Chol Calc (NIH): 71 mg/dL (ref 0–99)
Triglycerides: 76 mg/dL (ref 0–149)
VLDL Cholesterol Cal: 15 mg/dL (ref 5–40)

## 2023-12-30 LAB — CBC WITH DIFFERENTIAL/PLATELET
Basophils Absolute: 0 10*3/uL (ref 0.0–0.2)
Basos: 1 %
EOS (ABSOLUTE): 0.1 10*3/uL (ref 0.0–0.4)
Eos: 1 %
Hematocrit: 38 % (ref 34.0–46.6)
Hemoglobin: 12.4 g/dL (ref 11.1–15.9)
Immature Grans (Abs): 0 10*3/uL (ref 0.0–0.1)
Immature Granulocytes: 1 %
Lymphocytes Absolute: 1.6 10*3/uL (ref 0.7–3.1)
Lymphs: 24 %
MCH: 30.2 pg (ref 26.6–33.0)
MCHC: 32.6 g/dL (ref 31.5–35.7)
MCV: 93 fL (ref 79–97)
Monocytes Absolute: 0.7 10*3/uL (ref 0.1–0.9)
Monocytes: 9 %
Neutrophils Absolute: 4.5 10*3/uL (ref 1.4–7.0)
Neutrophils: 64 %
Platelets: 234 10*3/uL (ref 150–450)
RBC: 4.11 x10E6/uL (ref 3.77–5.28)
RDW: 13 % (ref 11.7–15.4)
WBC: 6.9 10*3/uL (ref 3.4–10.8)

## 2023-12-30 LAB — CMP14+EGFR
ALT: 21 IU/L (ref 0–32)
AST: 21 IU/L (ref 0–40)
Albumin: 4.4 g/dL (ref 3.8–4.8)
Alkaline Phosphatase: 112 IU/L (ref 44–121)
BUN/Creatinine Ratio: 21 (ref 12–28)
BUN: 48 mg/dL — ABNORMAL HIGH (ref 8–27)
Bilirubin Total: 0.4 mg/dL (ref 0.0–1.2)
CO2: 21 mmol/L (ref 20–29)
Calcium: 9.1 mg/dL (ref 8.7–10.3)
Chloride: 100 mmol/L (ref 96–106)
Creatinine, Ser: 2.28 mg/dL — ABNORMAL HIGH (ref 0.57–1.00)
Globulin, Total: 2.3 g/dL (ref 1.5–4.5)
Glucose: 95 mg/dL (ref 70–99)
Potassium: 4.5 mmol/L (ref 3.5–5.2)
Sodium: 139 mmol/L (ref 134–144)
Total Protein: 6.7 g/dL (ref 6.0–8.5)
eGFR: 21 mL/min/{1.73_m2} — ABNORMAL LOW (ref >59)

## 2023-12-30 LAB — HEMOGLOBIN A1C
Est. average glucose Bld gHb Est-mCnc: 246 mg/dL
Hgb A1c MFr Bld: 10.2 % — ABNORMAL HIGH (ref 4.8–5.6)

## 2024-01-17 ENCOUNTER — Other Ambulatory Visit: Payer: Self-pay | Admitting: Cardiology

## 2024-01-17 DIAGNOSIS — I10 Essential (primary) hypertension: Secondary | ICD-10-CM

## 2024-01-18 ENCOUNTER — Encounter: Payer: Self-pay | Admitting: Internal Medicine

## 2024-01-18 ENCOUNTER — Ambulatory Visit (INDEPENDENT_AMBULATORY_CARE_PROVIDER_SITE_OTHER): Admitting: Internal Medicine

## 2024-01-18 VITALS — BP 162/64 | HR 73 | Ht 63.0 in | Wt 125.8 lb

## 2024-01-18 DIAGNOSIS — E1169 Type 2 diabetes mellitus with other specified complication: Secondary | ICD-10-CM | POA: Diagnosis not present

## 2024-01-18 DIAGNOSIS — E1165 Type 2 diabetes mellitus with hyperglycemia: Secondary | ICD-10-CM | POA: Diagnosis not present

## 2024-01-18 DIAGNOSIS — Z1231 Encounter for screening mammogram for malignant neoplasm of breast: Secondary | ICD-10-CM

## 2024-01-18 DIAGNOSIS — Z794 Long term (current) use of insulin: Secondary | ICD-10-CM | POA: Diagnosis not present

## 2024-01-18 DIAGNOSIS — E1159 Type 2 diabetes mellitus with other circulatory complications: Secondary | ICD-10-CM

## 2024-01-18 DIAGNOSIS — I152 Hypertension secondary to endocrine disorders: Secondary | ICD-10-CM

## 2024-01-18 DIAGNOSIS — N1832 Chronic kidney disease, stage 3b: Secondary | ICD-10-CM

## 2024-01-18 DIAGNOSIS — E782 Mixed hyperlipidemia: Secondary | ICD-10-CM

## 2024-01-18 LAB — POCT CBG (FASTING - GLUCOSE)-MANUAL ENTRY: Glucose Fasting, POC: 70 mg/dL (ref 70–99)

## 2024-01-18 MED ORDER — INSULIN LISPRO (1 UNIT DIAL) 100 UNIT/ML (KWIKPEN): PEN_INJECTOR | SUBCUTANEOUS | 11 refills | Status: AC

## 2024-01-18 MED ORDER — HYDRALAZINE HCL 100 MG PO TABS: 100.0000 mg | ORAL_TABLET | Freq: Two times a day (BID) | ORAL | 11 refills | Status: AC

## 2024-01-18 NOTE — Progress Notes (Signed)
 Established Patient Office Visit  Subjective:  Patient ID: Patricia Cross, female    DOB: Sep 20, 1946  Age: 78 y.o. MRN: [id number]  Chief Complaint  Patient presents with   Follow-up    Discuss lab results    Patient comes in for her follow-up today.  Her blood pressure is very high, reports of an incidence this morning while at work.  But her previous blood pressure reading was also elevated.  Patient was advised to come in to discuss her lab results.  Her hemoglobin A1c is at 10.2 now.  She has an appointment to see her endocrinologist next week.  Patient reports that her fasting sugars are usually good but as she eats it goes high.  She used to have Humalog pen which she was using for sliding scale coverage with lunch and supper, but has run out of that.  Will send in a refill.  Previously she was advised to increase her Lantus to 32 units but was still using 28 units.  She is continues to take her [place] and Glucotrol.    No other concerns at this time.   Past Medical History:  Diagnosis Date   Acute renal failure (ARF) (HCC)    Anemia    Diabetes mellitus without complication (HCC) 10/18/1999   High cholesterol    Hypertension     Past Surgical History:  Procedure Laterality Date   ABDOMINAL HYSTERECTOMY  1981   BREAST BIOPSY Left 2005   neg core   BREAST BIOPSY Right 2015   neg core-Fibroadenoma   COLONOSCOPY WITH PROPOFOL N/A 01/07/2016   Procedure: COLONOSCOPY WITH PROPOFOL;  Surgeon: Wallace Cullens, MD;  Location: [facility] ENDOSCOPY;  Service: Gastroenterology;  Laterality: N/A;   COLONOSCOPY WITH PROPOFOL N/A 05/09/2018   Procedure: COLONOSCOPY WITH PROPOFOL;  Surgeon: Toledo, Boykin Nearing, MD;  Location: [facility] ENDOSCOPY;  Service: Gastroenterology;  Laterality: N/A;   ESOPHAGOGASTRODUODENOSCOPY (EGD) WITH PROPOFOL N/A 05/09/2018   Procedure: ESOPHAGOGASTRODUODENOSCOPY (EGD) WITH PROPOFOL;  Surgeon: Toledo, Boykin Nearing, MD;  Location: [facility] ENDOSCOPY;  Service: Gastroenterology;   Laterality: N/A;   ESOPHAGOGASTRODUODENOSCOPY (EGD) WITH PROPOFOL N/A 08/05/2022   Procedure: ESOPHAGOGASTRODUODENOSCOPY (EGD) WITH PROPOFOL;  Surgeon: Midge Minium, MD;  Location: [facility] ENDOSCOPY;  Service: Endoscopy;  Laterality: N/A;   MICROLARYNGOSCOPY WITH CO2 LASER AND EXCISION OF VOCAL CORD LESION  1979   OOPHORECTOMY     one left, don't know which one   TUBAL LIGATION  1975    Social History   Socioeconomic History   Marital status: Widowed    Spouse name: Not on file   Number of children: Not on file   Years of education: Not on file   Highest education level: Not on file  Occupational History   Not on file  Tobacco Use   Smoking status: Never   Smokeless tobacco: Never  Vaping Use   Vaping status: Never Used  Substance and Sexual Activity   Alcohol use: No   Drug use: No   Sexual activity: Not on file  Other Topics Concern   Not on file  Social History Narrative   Not on file   Social Drivers of Health   Financial Resource Strain: Not on file  Food Insecurity: No Food Insecurity (08/05/2022)   Hunger Vital Sign    Worried About Running Out of Food in the Last Year: Never true    Ran Out of Food in the Last Year: Never true  Transportation Needs: No Transportation Needs (08/05/2022)   PRAPARE -  Administrator, Civil Service (Medical): No    Lack of Transportation (Non-Medical): No  Physical Activity: Not on file  Stress: Not on file  Social Connections: Not on file  Intimate Partner Violence: Not At Risk (08/05/2022)   Humiliation, Afraid, Rape, and Kick questionnaire    Fear of Current or Ex-Partner: No    Emotionally Abused: No    Physically Abused: No    Sexually Abused: No    Family History  Problem Relation Age of Onset   Hypertension Mother    Stroke Mother    Gout Father    Colon cancer Sister    Pancreatic cancer Daughter    Breast cancer Neg Hx     No Known Allergies  Outpatient Medications Prior to Visit  Medication Sig    amLODipine (NORVASC) 10 MG tablet Take 1 tablet (10 mg total) by mouth daily.   ASPIRIN 81 PO Aspirin 81 MG Oral Tablet QTY: 0 tablet Days: 0 Refills: 0  Written: 09/07/18 Patient Instructions:   FARXIGA 10 MG TABS tablet Take 10 mg by mouth daily.   ferrous sulfate 325 (65 FE) MG EC tablet Take 1 tablet (325 mg total) by mouth 2 (two) times daily with a meal.   glipiZIDE (GLUCOTROL XL) 2.5 MG 24 hr tablet Take 2.5 mg by mouth 2 (two) times daily.   losartan (COZAAR) 50 MG tablet Take 1 tablet (50 mg total) by mouth daily.   metoCLOPramide (REGLAN) 5 MG tablet Take 1 tablet by mouth twice daily   Multiple Vitamins-Minerals (CENTRUM SILVER) tablet Centrum Silver Oral Tablet QTY: 0 tablet Days: 0 Refills: 0  Written: 11/12/15 Patient Instructions:   pantoprazole (PROTONIX) 40 MG tablet Take 1 tablet (40 mg total) by mouth daily.   ReliOn Alcohol Swabs PADS 1 each by Does not apply route 2 (two) times daily.   rosuvastatin (CRESTOR) 40 MG tablet Take 1 tablet by mouth once daily   [DISCONTINUED] hydrALAZINE (APRESOLINE) 50 MG tablet TAKE 1 TABLET BY MOUTH IN THE MORNING AND AT BEDTIME   insulin glargine (LANTUS SOLOSTAR) 100 UNIT/ML Solostar Pen Inject 28 Units into the skin at bedtime. (Patient taking differently: Inject 32 Units into the skin at bedtime.)   No facility-administered medications prior to visit.    Review of Systems  Constitutional: Negative.  Negative for chills, fever, malaise/fatigue and weight loss.  HENT: Negative.  Negative for sore throat.   Eyes: Negative.   Respiratory: Negative.  Negative for cough and shortness of breath.   Cardiovascular: Negative.  Negative for chest pain, palpitations and leg swelling.  Gastrointestinal: Negative.  Negative for abdominal pain, constipation, diarrhea, heartburn, nausea and vomiting.  Genitourinary: Negative.  Negative for dysuria and flank pain.  Musculoskeletal: Negative.  Negative for joint pain and myalgias.  Skin: Negative.    Neurological: Negative.  Negative for dizziness and headaches.  Endo/Heme/Allergies: Negative.   Psychiatric/Behavioral: Negative.  Negative for depression and suicidal ideas. The patient is not nervous/anxious.        Objective:   BP (!) 162/64   Pulse 73   Ht 5\' 3"  (1.6 m)   Wt 125 lb 12.8 oz (57.1 kg)   SpO2 94%   BMI 22.28 kg/m   Vitals:   01/18/24 1020  BP: (!) 162/64  Pulse: 73  Height: 5\' 3"  (1.6 m)  Weight: 125 lb 12.8 oz (57.1 kg)  SpO2: 94%  BMI (Calculated): 22.29    Physical Exam Vitals and nursing note reviewed.  Constitutional:  Appearance: Normal appearance.  HENT:     Head: Normocephalic and atraumatic.     Nose: Nose normal.     Mouth/Throat:     Mouth: Mucous membranes are moist.     Pharynx: Oropharynx is clear.  Eyes:     Conjunctiva/sclera: Conjunctivae normal.     Pupils: Pupils are equal, round, and reactive to light.  Cardiovascular:     Rate and Rhythm: Normal rate and regular rhythm.     Pulses: Normal pulses.     Heart sounds: Normal heart sounds. No murmur heard. Pulmonary:     Effort: Pulmonary effort is normal.     Breath sounds: Normal breath sounds. No wheezing.  Abdominal:     General: Bowel sounds are normal.     Palpations: Abdomen is soft.     Tenderness: There is no abdominal tenderness. There is no right CVA tenderness or left CVA tenderness.  Musculoskeletal:        General: Normal range of motion.     Cervical back: Normal range of motion.     Right lower leg: No edema.     Left lower leg: No edema.  Skin:    General: Skin is warm and dry.  Neurological:     General: No focal deficit present.     Mental Status: She is alert and oriented to person, place, and time.  Psychiatric:        Mood and Affect: Mood normal.        Behavior: Behavior normal.      Results for orders placed or performed in visit on 01/18/24  POCT CBG (Fasting - Glucose)  Result Value Ref Range   Glucose Fasting, POC 70 70 - 99  mg/dL    Recent Results (from the past 2160 hours)  POCT CBG (Fasting - Glucose)     Status: Abnormal   Collection Time: 11/30/23 10:27 AM  Result Value Ref Range   Glucose Fasting, POC 69 (A) 70 - 99 mg/dL  POCT CBG (Fasting - Glucose)     Status: None   Collection Time: 12/29/23 10:04 AM  Result Value Ref Range   Glucose Fasting, POC 71 70 - 99 mg/dL  [id number]     Status: Abnormal   Collection Time: 12/29/23 10:29 AM  Result Value Ref Range   Glucose 95 70 - 99 mg/dL   BUN 48 (H) 8 - 27 mg/dL   Creatinine, Ser 7.82 (H) 0.57 - 1.00 mg/dL   eGFR 21 (L) >95 AO/ZHY/8.65   BUN/Creatinine Ratio 21 12 - 28   Sodium 139 134 - 144 mmol/L   Potassium 4.5 3.5 - 5.2 mmol/L   Chloride 100 96 - 106 mmol/L   CO2 21 20 - 29 mmol/L   Calcium 9.1 8.7 - 10.3 mg/dL   Total Protein 6.7 6.0 - 8.5 g/dL   Albumin 4.4 3.8 - 4.8 g/dL   Globulin, Total 2.3 1.5 - 4.5 g/dL   Bilirubin Total 0.4 0.0 - 1.2 mg/dL   Alkaline Phosphatase 112 44 - 121 IU/L   AST 21 0 - 40 IU/L   ALT 21 0 - 32 IU/L  CBC with Diff     Status: None   Collection Time: 12/29/23 10:29 AM  Result Value Ref Range   WBC 6.9 3.4 - 10.8 x10E3/uL   RBC 4.11 3.77 - 5.28 x10E6/uL   Hemoglobin 12.4 11.1 - 15.9 g/dL   Hematocrit 78.4 69.6 - 46.6 %   MCV 93 79 - 97  fL   MCH 30.2 26.6 - 33.0 pg   MCHC 32.6 31.5 - 35.7 g/dL   RDW 16.1 09.6 - 04.5 %   Platelets 234 150 - 450 x10E3/uL   Neutrophils 64 Not Estab. %   Lymphs 24 Not Estab. %   Monocytes 9 Not Estab. %   Eos 1 Not Estab. %   Basos 1 Not Estab. %   Neutrophils Absolute 4.5 1.4 - 7.0 x10E3/uL   Lymphocytes Absolute 1.6 0.7 - 3.1 x10E3/uL   Monocytes Absolute 0.7 0.1 - 0.9 x10E3/uL   EOS (ABSOLUTE) 0.1 0.0 - 0.4 x10E3/uL   Basophils Absolute 0.0 0.0 - 0.2 x10E3/uL   Immature Granulocytes 1 Not Estab. %   Immature Grans (Abs) 0.0 0.0 - 0.1 x10E3/uL  Lipid Panel w/o Chol/HDL Ratio     Status: None   Collection Time: 12/29/23 10:29 AM  Result Value Ref Range    Cholesterol, Total 145 100 - 199 mg/dL   Triglycerides 76 0 - 149 mg/dL   HDL 59 >40 mg/dL   VLDL Cholesterol Cal 15 5 - 40 mg/dL   LDL Chol Calc (NIH) 71 0 - 99 mg/dL  Hemoglobin J8J     Status: Abnormal   Collection Time: 12/29/23 10:29 AM  Result Value Ref Range   Hgb A1c MFr Bld 10.2 (H) 4.8 - 5.6 %    Comment:          Prediabetes: 5.7 - 6.4          Diabetes: >6.4          Glycemic control for adults with diabetes: <7.0    Est. average glucose Bld gHb Est-mCnc 246 mg/dL  POCT CBG (Fasting - Glucose)     Status: None   Collection Time: 01/18/24 10:24 AM  Result Value Ref Range   Glucose Fasting, POC 70 70 - 99 mg/dL      Assessment & Plan:  Patient advised of strict diet control for diabetes.  Resume Humalog sliding scale coverage with lunch and supper.  Lantus should be at 32 units at bedtime.  Also to monitor her blood pressure at home.  Meanwhile we will increase her hydralazine to 100 mg twice a day.  Patient advised to keep her appointment with the endocrinologist. Problem List Items Addressed This Visit     Type 2 diabetes mellitus with hyperglycemia, with long-term current use of insulin (HCC)   Relevant Medications   insulin lispro (HUMALOG KWIKPEN) 100 UNIT/ML KwikPen   Other Relevant Orders   POCT CBG (Fasting - Glucose) (Completed)   Stage 3b chronic kidney disease (HCC)   Hypertension associated with diabetes (HCC) - Primary   Relevant Medications   hydrALAZINE (APRESOLINE) 100 MG tablet   insulin lispro (HUMALOG KWIKPEN) 100 UNIT/ML KwikPen   Combined hyperlipidemia associated with type 2 diabetes mellitus (HCC)   Relevant Medications   hydrALAZINE (APRESOLINE) 100 MG tablet   insulin lispro (HUMALOG KWIKPEN) 100 UNIT/ML KwikPen   Other Visit Diagnoses       Breast cancer screening by mammogram       Relevant Orders   MM 3D SCREENING MAMMOGRAM BILATERAL BREAST       Return in about 2 weeks (around 02/01/2024).   Total time spent: 30  minutes  Margaretann Loveless, MD  01/18/2024   This document may have been prepared by [facility] Voice Recognition software and as such may include unintentional dictation errors.

## 2024-02-01 ENCOUNTER — Ambulatory Visit (INDEPENDENT_AMBULATORY_CARE_PROVIDER_SITE_OTHER): Admitting: Internal Medicine

## 2024-02-01 ENCOUNTER — Encounter: Payer: Self-pay | Admitting: Internal Medicine

## 2024-02-01 VITALS — BP 130/50 | HR 79 | Ht 63.0 in | Wt 128.2 lb

## 2024-02-01 DIAGNOSIS — I152 Hypertension secondary to endocrine disorders: Secondary | ICD-10-CM

## 2024-02-01 DIAGNOSIS — E1159 Type 2 diabetes mellitus with other circulatory complications: Secondary | ICD-10-CM

## 2024-02-01 DIAGNOSIS — E1165 Type 2 diabetes mellitus with hyperglycemia: Secondary | ICD-10-CM | POA: Diagnosis not present

## 2024-02-01 DIAGNOSIS — Z794 Long term (current) use of insulin: Secondary | ICD-10-CM

## 2024-02-01 DIAGNOSIS — N1832 Chronic kidney disease, stage 3b: Secondary | ICD-10-CM | POA: Diagnosis not present

## 2024-02-01 DIAGNOSIS — E782 Mixed hyperlipidemia: Secondary | ICD-10-CM

## 2024-02-01 DIAGNOSIS — E1169 Type 2 diabetes mellitus with other specified complication: Secondary | ICD-10-CM | POA: Diagnosis not present

## 2024-02-01 LAB — POCT CBG (FASTING - GLUCOSE)-MANUAL ENTRY: Glucose Fasting, POC: 289 mg/dL — AB (ref 70–99)

## 2024-02-01 NOTE — Progress Notes (Signed)
 Established Patient Office Visit  Subjective:  Patient ID: Patricia Cross, female    DOB: August 09, 1946  Age: 78 y.o. MRN: [id number]  Chief Complaint  Patient presents with   Follow-up    2 week follow up    Patient comes in for her follow-up today.  Her blood pressure is looking much better since the dose of hydralazine was increased to 100 mg to twice a day.  She is also taking the rest of her medications. Patient was evaluated by the endocrinologist.  She starts out with low to normal blood sugar readings in the morning but then they go up with mealtimes.  She has not increased her Lantus beyond 28 units, but has been advised to use her sliding scale Humalog with lunch and supper and at bedtime if needed.  Patient is doing better now.  However this morning her sugar is high since she just had her breakfast.  She has been advised to start using her CGM but is reluctant to do so.  She will think about it and let us  know.  Overall she is feeling well and has no new complaints.    No other concerns at this time.   Past Medical History:  Diagnosis Date   Acute renal failure (ARF) (HCC)    Anemia    Diabetes mellitus without complication (HCC) 10/18/1999   High cholesterol    Hypertension     Past Surgical History:  Procedure Laterality Date   ABDOMINAL HYSTERECTOMY  1981   BREAST BIOPSY Left 2005   neg core   BREAST BIOPSY Right 2015   neg core-Fibroadenoma   COLONOSCOPY WITH PROPOFOL N/A 01/07/2016   Procedure: COLONOSCOPY WITH PROPOFOL;  Surgeon: Stephens Eis, MD;  Location: [facility] ENDOSCOPY;  Service: Gastroenterology;  Laterality: N/A;   COLONOSCOPY WITH PROPOFOL N/A 05/09/2018   Procedure: COLONOSCOPY WITH PROPOFOL;  Surgeon: Toledo, Alphonsus Jeans, MD;  Location: [facility] ENDOSCOPY;  Service: Gastroenterology;  Laterality: N/A;   ESOPHAGOGASTRODUODENOSCOPY (EGD) WITH PROPOFOL N/A 05/09/2018   Procedure: ESOPHAGOGASTRODUODENOSCOPY (EGD) WITH PROPOFOL;  Surgeon: Toledo, Alphonsus Jeans, MD;   Location: [facility] ENDOSCOPY;  Service: Gastroenterology;  Laterality: N/A;   ESOPHAGOGASTRODUODENOSCOPY (EGD) WITH PROPOFOL N/A 08/05/2022   Procedure: ESOPHAGOGASTRODUODENOSCOPY (EGD) WITH PROPOFOL;  Surgeon: Marnee Sink, MD;  Location: [facility] ENDOSCOPY;  Service: Endoscopy;  Laterality: N/A;   MICROLARYNGOSCOPY WITH CO2 LASER AND EXCISION OF VOCAL CORD LESION  1979   OOPHORECTOMY     one left, don't know which one   TUBAL LIGATION  1975    Social History   Socioeconomic History   Marital status: Widowed    Spouse name: Not on file   Number of children: Not on file   Years of education: Not on file   Highest education level: Not on file  Occupational History   Not on file  Tobacco Use   Smoking status: Never   Smokeless tobacco: Never  Vaping Use   Vaping status: Never Used  Substance and Sexual Activity   Alcohol use: No   Drug use: No   Sexual activity: Not on file  Other Topics Concern   Not on file  Social History Narrative   Not on file   Social Drivers of Health   Financial Resource Strain: Not on file  Food Insecurity: No Food Insecurity (08/05/2022)   Hunger Vital Sign    Worried About Running Out of Food in the Last Year: Never true    Ran Out of Food in the Last Year:  Never true  Transportation Needs: No Transportation Needs (08/05/2022)   PRAPARE - Administrator, Civil Service (Medical): No    Lack of Transportation (Non-Medical): No  Physical Activity: Not on file  Stress: Not on file  Social Connections: Not on file  Intimate Partner Violence: Not At Risk (08/05/2022)   Humiliation, Afraid, Rape, and Kick questionnaire    Fear of Current or Ex-Partner: No    Emotionally Abused: No    Physically Abused: No    Sexually Abused: No    Family History  Problem Relation Age of Onset   Hypertension Mother    Stroke Mother    Gout Father    Colon cancer Sister    Pancreatic cancer Daughter    Breast cancer Neg Hx     No Known  Allergies  Outpatient Medications Prior to Visit  Medication Sig   amLODipine (NORVASC) 10 MG tablet Take 1 tablet (10 mg total) by mouth daily.   ASPIRIN 81 PO Aspirin 81 MG Oral Tablet QTY: 0 tablet Days: 0 Refills: 0  Written: 09/07/18 Patient Instructions:   FARXIGA 10 MG TABS tablet Take 10 mg by mouth daily.   ferrous sulfate 325 (65 FE) MG EC tablet Take 1 tablet (325 mg total) by mouth 2 (two) times daily with a meal.   glipiZIDE (GLUCOTROL XL) 2.5 MG 24 hr tablet Take 2.5 mg by mouth 2 (two) times daily.   hydrALAZINE (APRESOLINE) 100 MG tablet Take 1 tablet (100 mg total) by mouth 2 (two) times daily.   insulin lispro (HUMALOG KWIKPEN) 100 UNIT/ML KwikPen Use with sliding scale before Lunch and Supper   losartan (COZAAR) 50 MG tablet Take 1 tablet (50 mg total) by mouth daily.   metoCLOPramide (REGLAN) 5 MG tablet Take 1 tablet by mouth twice daily   Multiple Vitamins-Minerals (CENTRUM SILVER) tablet Centrum Silver Oral Tablet QTY: 0 tablet Days: 0 Refills: 0  Written: 11/12/15 Patient Instructions:   pantoprazole (PROTONIX) 40 MG tablet Take 1 tablet (40 mg total) by mouth daily.   ReliOn Alcohol Swabs PADS 1 each by Does not apply route 2 (two) times daily.   rosuvastatin (CRESTOR) 40 MG tablet Take 1 tablet by mouth once daily   insulin glargine (LANTUS SOLOSTAR) 100 UNIT/ML Solostar Pen Inject 28 Units into the skin at bedtime. (Patient taking differently: Inject 32 Units into the skin at bedtime.)   No facility-administered medications prior to visit.    Review of Systems  Constitutional: Negative.  Negative for chills, diaphoresis, fever, malaise/fatigue and weight loss.  HENT: Negative.  Negative for sinus pain and sore throat.   Eyes: Negative.   Respiratory: Negative.  Negative for cough and shortness of breath.   Cardiovascular: Negative.  Negative for chest pain, palpitations and leg swelling.  Gastrointestinal: Negative.  Negative for abdominal pain, constipation,  diarrhea, heartburn, nausea and vomiting.  Genitourinary: Negative.  Negative for dysuria and flank pain.  Musculoskeletal: Negative.  Negative for joint pain and myalgias.  Skin: Negative.   Neurological: Negative.  Negative for dizziness and headaches.  Endo/Heme/Allergies: Negative.   Psychiatric/Behavioral: Negative.  Negative for depression and suicidal ideas. The patient is not nervous/anxious.        Objective:   BP (!) 130/50   Pulse 79   Ht 5\' 3"  (1.6 m)   Wt 128 lb 3.2 oz (58.2 kg)   SpO2 94%   BMI 22.71 kg/m   Vitals:   02/01/24 0914  BP: (!) 130/50  Pulse:  79  Height: 5\' 3"  (1.6 m)  Weight: 128 lb 3.2 oz (58.2 kg)  SpO2: 94%  BMI (Calculated): 22.72    Physical Exam Vitals and nursing note reviewed.  Constitutional:      Appearance: Normal appearance.  HENT:     Head: Normocephalic and atraumatic.     Nose: Nose normal.     Mouth/Throat:     Mouth: Mucous membranes are moist.     Pharynx: Oropharynx is clear.  Eyes:     Conjunctiva/sclera: Conjunctivae normal.     Pupils: Pupils are equal, round, and reactive to light.  Cardiovascular:     Rate and Rhythm: Normal rate and regular rhythm.     Pulses: Normal pulses.     Heart sounds: Normal heart sounds. No murmur heard. Pulmonary:     Effort: Pulmonary effort is normal.     Breath sounds: Normal breath sounds. No wheezing.  Abdominal:     General: Bowel sounds are normal.     Palpations: Abdomen is soft.     Tenderness: There is no abdominal tenderness. There is no right CVA tenderness or left CVA tenderness.  Musculoskeletal:        General: Normal range of motion.     Cervical back: Normal range of motion.     Right lower leg: No edema.     Left lower leg: No edema.  Skin:    General: Skin is warm and dry.  Neurological:     General: No focal deficit present.     Mental Status: She is alert and oriented to person, place, and time.  Psychiatric:        Mood and Affect: Mood normal.         Behavior: Behavior normal.      Results for orders placed or performed in visit on 02/01/24  POCT CBG (Fasting - Glucose)  Result Value Ref Range   Glucose Fasting, POC 289 (A) 70 - 99 mg/dL    Recent Results (from the past 2160 hours)  POCT CBG (Fasting - Glucose)     Status: Abnormal   Collection Time: 11/30/23 10:27 AM  Result Value Ref Range   Glucose Fasting, POC 69 (A) 70 - 99 mg/dL  POCT CBG (Fasting - Glucose)     Status: None   Collection Time: 12/29/23 10:04 AM  Result Value Ref Range   Glucose Fasting, POC 71 70 - 99 mg/dL  [id number]     Status: Abnormal   Collection Time: 12/29/23 10:29 AM  Result Value Ref Range   Glucose 95 70 - 99 mg/dL   BUN 48 (H) 8 - 27 mg/dL   Creatinine, Ser 8.29 (H) 0.57 - 1.00 mg/dL   eGFR 21 (L) >56 OZ/HYQ/6.57   BUN/Creatinine Ratio 21 12 - 28   Sodium 139 134 - 144 mmol/L   Potassium 4.5 3.5 - 5.2 mmol/L   Chloride 100 96 - 106 mmol/L   CO2 21 20 - 29 mmol/L   Calcium 9.1 8.7 - 10.3 mg/dL   Total Protein 6.7 6.0 - 8.5 g/dL   Albumin 4.4 3.8 - 4.8 g/dL   Globulin, Total 2.3 1.5 - 4.5 g/dL   Bilirubin Total 0.4 0.0 - 1.2 mg/dL   Alkaline Phosphatase 112 44 - 121 IU/L   AST 21 0 - 40 IU/L   ALT 21 0 - 32 IU/L  CBC with Diff     Status: None   Collection Time: 12/29/23 10:29 AM  Result Value Ref  Range   WBC 6.9 3.4 - 10.8 x10E3/uL   RBC 4.11 3.77 - 5.28 x10E6/uL   Hemoglobin 12.4 11.1 - 15.9 g/dL   Hematocrit 16.1 09.6 - 46.6 %   MCV 93 79 - 97 fL   MCH 30.2 26.6 - 33.0 pg   MCHC 32.6 31.5 - 35.7 g/dL   RDW 04.5 40.9 - 81.1 %   Platelets 234 150 - 450 x10E3/uL   Neutrophils 64 Not Estab. %   Lymphs 24 Not Estab. %   Monocytes 9 Not Estab. %   Eos 1 Not Estab. %   Basos 1 Not Estab. %   Neutrophils Absolute 4.5 1.4 - 7.0 x10E3/uL   Lymphocytes Absolute 1.6 0.7 - 3.1 x10E3/uL   Monocytes Absolute 0.7 0.1 - 0.9 x10E3/uL   EOS (ABSOLUTE) 0.1 0.0 - 0.4 x10E3/uL   Basophils Absolute 0.0 0.0 - 0.2 x10E3/uL   Immature  Granulocytes 1 Not Estab. %   Immature Grans (Abs) 0.0 0.0 - 0.1 x10E3/uL  Lipid Panel w/o Chol/HDL Ratio     Status: None   Collection Time: 12/29/23 10:29 AM  Result Value Ref Range   Cholesterol, Total 145 100 - 199 mg/dL   Triglycerides 76 0 - 149 mg/dL   HDL 59 >91 mg/dL   VLDL Cholesterol Cal 15 5 - 40 mg/dL   LDL Chol Calc (NIH) 71 0 - 99 mg/dL  Hemoglobin Y7W     Status: Abnormal   Collection Time: 12/29/23 10:29 AM  Result Value Ref Range   Hgb A1c MFr Bld 10.2 (H) 4.8 - 5.6 %    Comment:          Prediabetes: 5.7 - 6.4          Diabetes: >6.4          Glycemic control for adults with diabetes: <7.0    Est. average glucose Bld gHb Est-mCnc 246 mg/dL  POCT CBG (Fasting - Glucose)     Status: None   Collection Time: 01/18/24 10:24 AM  Result Value Ref Range   Glucose Fasting, POC 70 70 - 99 mg/dL  POCT CBG (Fasting - Glucose)     Status: Abnormal   Collection Time: 02/01/24  9:21 AM  Result Value Ref Range   Glucose Fasting, POC 289 (A) 70 - 99 mg/dL      Assessment & Plan:  Patient advised to continue taking medications.  Also to monitor blood sugar.  Keep follow-up with endocrine and renal. Problem List Items Addressed This Visit     Type 2 diabetes mellitus with hyperglycemia, with long-term current use of insulin (HCC)   Relevant Orders   POCT CBG (Fasting - Glucose) (Completed)   Stage 3b chronic kidney disease (HCC)   Hypertension associated with diabetes (HCC) - Primary   Combined hyperlipidemia associated with type 2 diabetes mellitus (HCC)    Follow up 3 months.   Total time spent: 30 minutes  Aisha Hove, MD  02/01/2024   This document may have been prepared by [facility] Voice Recognition software and as such may include unintentional dictation errors.

## 2024-02-15 ENCOUNTER — Ambulatory Visit
Admission: RE | Admit: 2024-02-15 | Discharge: 2024-02-15 | Disposition: A | Discharge status: Home / Self Care | Source: Ambulatory Visit | Attending: Internal Medicine | Admitting: Internal Medicine

## 2024-02-15 DIAGNOSIS — Z1231 Encounter for screening mammogram for malignant neoplasm of breast: Secondary | ICD-10-CM | POA: Diagnosis present

## 2024-03-11 ENCOUNTER — Other Ambulatory Visit: Payer: Self-pay | Admitting: Internal Medicine

## 2024-03-11 DIAGNOSIS — E1165 Type 2 diabetes mellitus with hyperglycemia: Secondary | ICD-10-CM

## 2024-03-21 ENCOUNTER — Other Ambulatory Visit: Payer: Self-pay | Admitting: Internal Medicine

## 2024-04-01 ENCOUNTER — Ambulatory Visit: Admitting: Internal Medicine

## 2024-04-29 ENCOUNTER — Other Ambulatory Visit: Payer: Self-pay

## 2024-04-29 MED ORDER — GLIPIZIDE ER 2.5 MG PO TB24: 2.5000 mg | ORAL_TABLET | Freq: Two times a day (BID) | ORAL | 2 refills | Status: AC

## 2024-05-02 ENCOUNTER — Encounter: Payer: Self-pay | Admitting: Internal Medicine

## 2024-05-02 ENCOUNTER — Ambulatory Visit: Payer: Self-pay | Admitting: Internal Medicine

## 2024-05-02 ENCOUNTER — Ambulatory Visit (INDEPENDENT_AMBULATORY_CARE_PROVIDER_SITE_OTHER): Admitting: Internal Medicine

## 2024-05-02 VITALS — BP 132/58 | HR 74 | Ht 63.0 in | Wt 130.0 lb

## 2024-05-02 DIAGNOSIS — E1159 Type 2 diabetes mellitus with other circulatory complications: Secondary | ICD-10-CM

## 2024-05-02 DIAGNOSIS — E1169 Type 2 diabetes mellitus with other specified complication: Secondary | ICD-10-CM

## 2024-05-02 DIAGNOSIS — N1832 Chronic kidney disease, stage 3b: Secondary | ICD-10-CM

## 2024-05-02 DIAGNOSIS — E782 Mixed hyperlipidemia: Secondary | ICD-10-CM | POA: Diagnosis not present

## 2024-05-02 DIAGNOSIS — I152 Hypertension secondary to endocrine disorders: Secondary | ICD-10-CM

## 2024-05-02 DIAGNOSIS — Z794 Long term (current) use of insulin: Secondary | ICD-10-CM | POA: Diagnosis not present

## 2024-05-02 DIAGNOSIS — D508 Other iron deficiency anemias: Secondary | ICD-10-CM

## 2024-05-02 DIAGNOSIS — E1165 Type 2 diabetes mellitus with hyperglycemia: Secondary | ICD-10-CM | POA: Diagnosis not present

## 2024-05-02 LAB — POCT CBG (FASTING - GLUCOSE)-MANUAL ENTRY: Glucose Fasting, POC: 227 mg/dL — AB (ref 70–99)

## 2024-05-02 NOTE — Progress Notes (Signed)
 Established Patient Office Visit  Subjective:  Patient ID: Patricia Cross, female    DOB: 03/13/1946  Age: 78 y.o. MRN: [id number]  Chief Complaint  Patient presents with   Follow-up    3 month follow up    Patient comes in for follow-up today.  She is generally feeling well and offers no new complaints.  She has gained a few pounds.  She is under care of endocrinology for her diabetes, and nephrology for her CKD.   Her fingerstick glucose is high today, admits to dietary indiscretion.  Denies nausea or vomiting, no chest pain and no shortness of breath.    No other concerns at this time.   Past Medical History:  Diagnosis Date   Acute renal failure (ARF) (HCC)    Anemia    Diabetes mellitus without complication (HCC) 10/18/1999   High cholesterol    Hypertension     Past Surgical History:  Procedure Laterality Date   ABDOMINAL HYSTERECTOMY  1981   BREAST BIOPSY Left 2005   neg core   BREAST BIOPSY Right 2015   neg core-Fibroadenoma   COLONOSCOPY WITH PROPOFOL  N/A 01/07/2016   Procedure: COLONOSCOPY WITH PROPOFOL ;  Surgeon: Deward CINDERELLA Piedmont, MD;  Location: [facility] ENDOSCOPY;  Service: Gastroenterology;  Laterality: N/A;   COLONOSCOPY WITH PROPOFOL  N/A 05/09/2018   Procedure: COLONOSCOPY WITH PROPOFOL ;  Surgeon: Toledo, Ladell POUR, MD;  Location: [facility] ENDOSCOPY;  Service: Gastroenterology;  Laterality: N/A;   ESOPHAGOGASTRODUODENOSCOPY (EGD) WITH PROPOFOL  N/A 05/09/2018   Procedure: ESOPHAGOGASTRODUODENOSCOPY (EGD) WITH PROPOFOL ;  Surgeon: Toledo, Ladell POUR, MD;  Location: [facility] ENDOSCOPY;  Service: Gastroenterology;  Laterality: N/A;   ESOPHAGOGASTRODUODENOSCOPY (EGD) WITH PROPOFOL  N/A 08/05/2022   Procedure: ESOPHAGOGASTRODUODENOSCOPY (EGD) WITH PROPOFOL ;  Surgeon: Jinny Carmine, MD;  Location: [facility] ENDOSCOPY;  Service: Endoscopy;  Laterality: N/A;   MICROLARYNGOSCOPY WITH CO2 LASER AND EXCISION OF VOCAL CORD LESION  1979   OOPHORECTOMY     one left, don't know which one   TUBAL  LIGATION  1975    Social History   Socioeconomic History   Marital status: Widowed    Spouse name: Not on file   Number of children: Not on file   Years of education: Not on file   Highest education level: Not on file  Occupational History   Not on file  Tobacco Use   Smoking status: Never   Smokeless tobacco: Never  Vaping Use   Vaping status: Never Used  Substance and Sexual Activity   Alcohol  use: No   Drug use: No   Sexual activity: Not on file  Other Topics Concern   Not on file  Social History Narrative   Not on file   Social Drivers of Health   Financial Resource Strain: Not on file  Food Insecurity: No Food Insecurity (08/05/2022)   Hunger Vital Sign    Worried About Running Out of Food in the Last Year: Never true    Ran Out of Food in the Last Year: Never true  Transportation Needs: No Transportation Needs (08/05/2022)   PRAPARE - Administrator, Civil Service (Medical): No    Lack of Transportation (Non-Medical): No  Physical Activity: Not on file  Stress: Not on file  Social Connections: Not on file  Intimate Partner Violence: Not At Risk (08/05/2022)   Humiliation, Afraid, Rape, and Kick questionnaire    Fear of Current or Ex-Partner: No    Emotionally Abused: No    Physically Abused: No    Sexually  Abused: No    Family History  Problem Relation Age of Onset   Hypertension Mother    Stroke Mother    Gout Father    Colon cancer Sister    Pancreatic cancer Daughter    Breast cancer Neg Hx     No Known Allergies  Outpatient Medications Prior to Visit  Medication Sig   amLODipine  (NORVASC ) 10 MG tablet Take 1 tablet (10 mg total) by mouth daily.   ASPIRIN 81 PO Aspirin 81 MG Oral Tablet QTY: 0 tablet Days: 0 Refills: 0  Written: 09/07/18 Patient Instructions:   FARXIGA 10 MG TABS tablet Take 10 mg by mouth daily.   ferrous sulfate  325 (65 FE) MG EC tablet TAKE 1 TABLET BY MOUTH TWICE DAILY WITH A MEAL   glipiZIDE  (GLUCOTROL  XL)  2.5 MG 24 hr tablet Take 1 tablet (2.5 mg total) by mouth 2 (two) times daily.   hydrALAZINE  (APRESOLINE ) 100 MG tablet Take 1 tablet (100 mg total) by mouth 2 (two) times daily.   insulin  lispro (HUMALOG  KWIKPEN) 100 UNIT/ML KwikPen Use with sliding scale before Lunch and Supper   LANTUS  SOLOSTAR 100 UNIT/ML Solostar Pen Inject 32 Units into the skin at bedtime.   losartan  (COZAAR ) 50 MG tablet Take 1 tablet (50 mg total) by mouth daily.   metoCLOPramide  (REGLAN ) 5 MG tablet Take 1 tablet by mouth twice daily   Multiple Vitamins-Minerals (CENTRUM SILVER) tablet Centrum Silver Oral Tablet QTY: 0 tablet Days: 0 Refills: 0  Written: 11/12/15 Patient Instructions:   pantoprazole  (PROTONIX ) 40 MG tablet Take 1 tablet (40 mg total) by mouth daily.   ReliOn Alcohol  Swabs  PADS 1 each by Does not apply route 2 (two) times daily.   rosuvastatin  (CRESTOR ) 40 MG tablet Take 1 tablet by mouth once daily   No facility-administered medications prior to visit.    Review of Systems  Constitutional: Negative.  Negative for chills, diaphoresis, fever, malaise/fatigue and weight loss.  HENT: Negative.  Negative for sore throat.   Eyes: Negative.   Respiratory: Negative.  Negative for cough and shortness of breath.   Cardiovascular: Negative.  Negative for chest pain, palpitations and leg swelling.  Gastrointestinal: Negative.  Negative for abdominal pain, constipation, diarrhea, heartburn, nausea and vomiting.  Genitourinary: Negative.  Negative for dysuria and flank pain.  Musculoskeletal: Negative.  Negative for joint pain and myalgias.  Skin: Negative.   Neurological: Negative.  Negative for dizziness, tingling, tremors and headaches.  Endo/Heme/Allergies: Negative.   Psychiatric/Behavioral: Negative.  Negative for depression and suicidal ideas. The patient is not nervous/anxious.        Objective:   BP (!) 132/58   Pulse 74   Ht 5' 3 (1.6 m)   Wt 130 lb (59 kg)   SpO2 97%   BMI 23.03 kg/m    Vitals:   05/02/24 1029  BP: (!) 132/58  Pulse: 74  Height: 5' 3 (1.6 m)  Weight: 130 lb (59 kg)  SpO2: 97%  BMI (Calculated): 23.03    Physical Exam Vitals and nursing note reviewed.  Constitutional:      Appearance: Normal appearance.  HENT:     Head: Normocephalic and atraumatic.     Nose: Nose normal.     Mouth/Throat:     Mouth: Mucous membranes are moist.     Pharynx: Oropharynx is clear.  Eyes:     Conjunctiva/sclera: Conjunctivae normal.     Pupils: Pupils are equal, round, and reactive to light.  Cardiovascular:  Rate and Rhythm: Normal rate and regular rhythm.     Pulses: Normal pulses.     Heart sounds: Normal heart sounds. No murmur heard. Pulmonary:     Effort: Pulmonary effort is normal.     Breath sounds: Normal breath sounds. No wheezing.  Abdominal:     General: Bowel sounds are normal.     Palpations: Abdomen is soft.     Tenderness: There is no abdominal tenderness. There is no right CVA tenderness or left CVA tenderness.  Musculoskeletal:        General: Normal range of motion.     Cervical back: Normal range of motion.     Right lower leg: No edema.     Left lower leg: No edema.  Skin:    General: Skin is warm and dry.  Neurological:     General: No focal deficit present.     Mental Status: She is alert and oriented to person, place, and time.  Psychiatric:        Mood and Affect: Mood normal.        Behavior: Behavior normal.      Results for orders placed or performed in visit on 05/02/24  POCT CBG (Fasting - Glucose)  Result Value Ref Range   Glucose Fasting, POC 227 (A) 70 - 99 mg/dL    Recent Results (from the past 2160 hours)  POCT CBG (Fasting - Glucose)     Status: Abnormal   Collection Time: 05/02/24 10:33 AM  Result Value Ref Range   Glucose Fasting, POC 227 (A) 70 - 99 mg/dL      Assessment & Plan:  Continue current medication.  Check blood work today. Problem List Items Addressed This Visit     Type 2  diabetes mellitus with hyperglycemia, with long-term current use of insulin  (HCC)   Relevant Orders   POCT CBG (Fasting - Glucose) (Completed)   Hemoglobin A1c   Absolute anemia   Stage 3b chronic kidney disease (HCC)   Hypertension associated with diabetes (HCC) - Primary   Relevant Orders   CMP14+EGFR   Combined hyperlipidemia associated with type 2 diabetes mellitus (HCC)   Relevant Orders   CMP14+EGFR   Lipid Panel w/o Chol/HDL Ratio    Return in about 3 months (around 08/02/2024).   Total time spent: 30 minutes  FERNAND FREDY RAMAN, MD  05/02/2024   This document may have been prepared by [facility] Voice Recognition software and as such may include unintentional dictation errors.

## 2024-05-03 LAB — CMP14+EGFR
ALT: 28 IU/L (ref 0–32)
AST: 25 IU/L (ref 0–40)
Albumin: 4.3 g/dL (ref 3.8–4.8)
Alkaline Phosphatase: 124 IU/L — ABNORMAL HIGH (ref 44–121)
BUN/Creatinine Ratio: 18 (ref 12–28)
BUN: 57 mg/dL — ABNORMAL HIGH (ref 8–27)
Bilirubin Total: 0.4 mg/dL (ref 0.0–1.2)
CO2: 17 mmol/L — ABNORMAL LOW (ref 20–29)
Calcium: 9 mg/dL (ref 8.7–10.3)
Chloride: 106 mmol/L (ref 96–106)
Creatinine, Ser: 3.1 mg/dL — ABNORMAL HIGH (ref 0.57–1.00)
Globulin, Total: 2.6 g/dL (ref 1.5–4.5)
Glucose: 200 mg/dL — ABNORMAL HIGH (ref 70–99)
Potassium: 4.7 mmol/L (ref 3.5–5.2)
Sodium: 137 mmol/L (ref 134–144)
Total Protein: 6.9 g/dL (ref 6.0–8.5)
eGFR: 15 mL/min/1.73 — ABNORMAL LOW (ref >59)

## 2024-05-03 LAB — LIPID PANEL W/O CHOL/HDL RATIO
Cholesterol, Total: 142 mg/dL (ref 100–199)
HDL: 57 mg/dL (ref >39)
LDL Chol Calc (NIH): 69 mg/dL (ref 0–99)
Triglycerides: 84 mg/dL (ref 0–149)
VLDL Cholesterol Cal: 16 mg/dL (ref 5–40)

## 2024-05-03 LAB — HEMOGLOBIN A1C
Est. average glucose Bld gHb Est-mCnc: 206 mg/dL
Hgb A1c MFr Bld: 8.8 % — ABNORMAL HIGH (ref 4.8–5.6)

## 2024-05-06 NOTE — Progress Notes (Signed)
 Pt states she has an appointment in early August but hasn't seen them recently.

## 2024-06-19 ENCOUNTER — Other Ambulatory Visit: Payer: Self-pay | Admitting: Internal Medicine

## 2024-06-19 DIAGNOSIS — R110 Nausea: Secondary | ICD-10-CM

## 2024-06-24 ENCOUNTER — Other Ambulatory Visit: Payer: Self-pay | Admitting: Internal Medicine

## 2024-06-24 DIAGNOSIS — R110 Nausea: Secondary | ICD-10-CM

## 2024-07-08 ENCOUNTER — Other Ambulatory Visit: Payer: Self-pay | Admitting: Internal Medicine

## 2024-07-08 DIAGNOSIS — I10 Essential (primary) hypertension: Secondary | ICD-10-CM

## 2024-07-24 LAB — MICROALBUMIN / CREATININE URINE RATIO: Microalb Creat Ratio: 1205

## 2024-07-24 LAB — PROTEIN / CREATININE RATIO, URINE
Albumin, U: 53
Creatinine, Urine: 44

## 2024-08-02 ENCOUNTER — Ambulatory Visit: Admitting: Internal Medicine

## 2024-08-02 ENCOUNTER — Ambulatory Visit: Payer: Self-pay | Admitting: Internal Medicine

## 2024-08-02 ENCOUNTER — Encounter: Payer: Self-pay | Admitting: Internal Medicine

## 2024-08-02 VITALS — BP 160/76 | HR 76 | Ht 63.0 in | Wt 139.2 lb

## 2024-08-02 DIAGNOSIS — E1159 Type 2 diabetes mellitus with other circulatory complications: Secondary | ICD-10-CM

## 2024-08-02 DIAGNOSIS — Z794 Long term (current) use of insulin: Secondary | ICD-10-CM

## 2024-08-02 DIAGNOSIS — Z23 Encounter for immunization: Secondary | ICD-10-CM | POA: Diagnosis not present

## 2024-08-02 DIAGNOSIS — I152 Hypertension secondary to endocrine disorders: Secondary | ICD-10-CM

## 2024-08-02 DIAGNOSIS — E1169 Type 2 diabetes mellitus with other specified complication: Secondary | ICD-10-CM | POA: Diagnosis not present

## 2024-08-02 DIAGNOSIS — E782 Mixed hyperlipidemia: Secondary | ICD-10-CM

## 2024-08-02 DIAGNOSIS — N184 Chronic kidney disease, stage 4 (severe): Secondary | ICD-10-CM | POA: Insufficient documentation

## 2024-08-02 DIAGNOSIS — E1165 Type 2 diabetes mellitus with hyperglycemia: Secondary | ICD-10-CM | POA: Diagnosis not present

## 2024-08-02 LAB — POCT CBG (FASTING - GLUCOSE)-MANUAL ENTRY: Glucose Fasting, POC: 115 mg/dL — AB (ref 70–99)

## 2024-08-02 MED ORDER — FUROSEMIDE 20 MG PO TABS: 20.0000 mg | ORAL_TABLET | Freq: Every day | ORAL | 11 refills | Status: AC

## 2024-08-02 NOTE — Progress Notes (Signed)
 Established Patient Office Visit  Subjective:  Patient ID: Patricia Cross, female    DOB: Sep 27, 1946  Age: 78 y.o. MRN: [id number]  Chief Complaint  Patient presents with   Follow-up    3 month follow up    Patient is here today for follow up. She reports feeling well. She sees Nephrology and they had began discussing future need for Dialysis for worsening CKD stage 4. She also sees Endocrinology for diabetic management. She is taking her medications as prescribed without side effects. She has some BLE edema on examination today. Patient is not on any diuretics at this time. She denies chest pain, shortness of breath. Will start lasix 20 mg daily. Her eye exam was completed 02/2024. She is due for routine blood work today.  Patient wants a flu shot today. She got RSV vaccine lat year. She does not need any refills at this time.    No other concerns at this time.   Past Medical History:  Diagnosis Date   Acute renal failure (ARF)    Anemia    Diabetes mellitus without complication (HCC) 10/18/1999   High cholesterol    Hypertension     Past Surgical History:  Procedure Laterality Date   ABDOMINAL HYSTERECTOMY  1981   BREAST BIOPSY Left 2005   neg core   BREAST BIOPSY Right 2015   neg core-Fibroadenoma   COLONOSCOPY WITH PROPOFOL  N/A 01/07/2016   Procedure: COLONOSCOPY WITH PROPOFOL ;  Surgeon: Deward CINDERELLA Piedmont, MD;  Location: [facility];  Service: Gastroenterology;  Laterality: N/A;   COLONOSCOPY WITH PROPOFOL  N/A 05/09/2018   Procedure: COLONOSCOPY WITH PROPOFOL ;  Surgeon: Toledo, Ladell POUR, MD;  Location: [facility];  Service: Gastroenterology;  Laterality: N/A;   ESOPHAGOGASTRODUODENOSCOPY (EGD) WITH PROPOFOL  N/A 05/09/2018   Procedure: ESOPHAGOGASTRODUODENOSCOPY (EGD) WITH PROPOFOL ;  Surgeon: Toledo, Ladell POUR, MD;  Location: [facility];  Service: Gastroenterology;  Laterality: N/A;   ESOPHAGOGASTRODUODENOSCOPY (EGD) WITH PROPOFOL  N/A 08/05/2022   Procedure:  ESOPHAGOGASTRODUODENOSCOPY (EGD) WITH PROPOFOL ;  Surgeon: Jinny Carmine, MD;  Location: [facility];  Service: Endoscopy;  Laterality: N/A;   MICROLARYNGOSCOPY WITH CO2 LASER AND EXCISION OF VOCAL CORD LESION  1979   OOPHORECTOMY     one left, don't know which one   TUBAL LIGATION  1975    Social History   Socioeconomic History   Marital status: Widowed    Spouse name: Not on file   Number of children: Not on file   Years of education: Not on file   Highest education level: Not on file  Occupational History   Not on file  Tobacco Use   Smoking status: Never   Smokeless tobacco: Never  Vaping Use   Vaping status: Never Used  Substance and Sexual Activity   Alcohol  use: No   Drug use: No   Sexual activity: Not on file  Other Topics Concern   Not on file  Social History Narrative   Not on file   Social Drivers of Health   Financial Resource Strain: Not on file  Food Insecurity: No Food Insecurity (08/05/2022)   Hunger Vital Sign    Worried About Running Out of Food in the Last Year: Never true    Ran Out of Food in the Last Year: Never true  Transportation Needs: No Transportation Needs (08/05/2022)   PRAPARE - Administrator, Civil Service (Medical): No    Lack of Transportation (Non-Medical): No  Physical Activity: Not on file  Stress: Not on file  Social Connections: Not on file  Intimate Partner Violence: Not At Risk (08/05/2022)   Humiliation, Afraid, Rape, and Kick questionnaire    Fear of Current or Ex-Partner: No    Emotionally Abused: No    Physically Abused: No    Sexually Abused: No    Family History  Problem Relation Age of Onset   Hypertension Mother    Stroke Mother    Gout Father    Colon cancer Sister    Pancreatic cancer Daughter    Breast cancer Neg Hx     No Known Allergies  Outpatient Medications Prior to Visit  Medication Sig   amLODipine  (NORVASC ) 10 MG tablet Take 1 tablet by mouth once daily   ASPIRIN 81 PO Aspirin  81 MG Oral Tablet QTY: 0 tablet Days: 0 Refills: 0  Written: 09/07/18 Patient Instructions:   FARXIGA 10 MG TABS tablet Take 10 mg by mouth daily.   ferrous sulfate  325 (65 FE) MG EC tablet TAKE 1 TABLET BY MOUTH TWICE DAILY WITH A MEAL   glipiZIDE  (GLUCOTROL  XL) 2.5 MG 24 hr tablet Take 1 tablet (2.5 mg total) by mouth 2 (two) times daily.   hydrALAZINE  (APRESOLINE ) 100 MG tablet Take 1 tablet (100 mg total) by mouth 2 (two) times daily.   insulin  lispro (HUMALOG  KWIKPEN) 100 UNIT/ML KwikPen Use with sliding scale before Lunch and Supper   LANTUS  SOLOSTAR 100 UNIT/ML Solostar Pen Inject 32 Units into the skin at bedtime.   losartan  (COZAAR ) 50 MG tablet Take 1 tablet (50 mg total) by mouth daily.   metoCLOPramide  (REGLAN ) 5 MG tablet Take 1 tablet by mouth twice daily   Multiple Vitamins-Minerals (CENTRUM SILVER) tablet Centrum Silver Oral Tablet QTY: 0 tablet Days: 0 Refills: 0  Written: 11/12/15 Patient Instructions:   pantoprazole  (PROTONIX ) 40 MG tablet Take 1 tablet (40 mg total) by mouth daily.   ReliOn Alcohol  Swabs  PADS 1 each by Does not apply route 2 (two) times daily.   rosuvastatin  (CRESTOR ) 40 MG tablet Take 1 tablet by mouth once daily   No facility-administered medications prior to visit.    Review of Systems  Constitutional: Negative.  Negative for chills, fever and malaise/fatigue.  HENT: Negative.  Negative for congestion and sore throat.   Eyes: Negative.  Negative for blurred vision and pain.  Respiratory: Negative.  Negative for cough and shortness of breath.   Cardiovascular:  Positive for leg swelling. Negative for chest pain and palpitations.  Gastrointestinal: Negative.  Negative for abdominal pain, blood in stool, constipation, diarrhea, heartburn, melena, nausea and vomiting.  Genitourinary: Negative.  Negative for dysuria, flank pain, frequency and urgency.  Musculoskeletal: Negative.  Negative for joint pain and myalgias.  Skin: Negative.   Neurological:  Negative.  Negative for dizziness, tingling, sensory change, weakness and headaches.  Endo/Heme/Allergies: Negative.   Psychiatric/Behavioral: Negative.  Negative for depression and suicidal ideas. The patient is not nervous/anxious.        Objective:   BP (!) 160/76   Pulse 76   Ht 5' 3 (1.6 m)   Wt 139 lb 3.2 oz (63.1 kg)   SpO2 97%   BMI 24.66 kg/m   Vitals:   08/02/24 1015  BP: (!) 160/76  Pulse: 76  Height: 5' 3 (1.6 m)  Weight: 139 lb 3.2 oz (63.1 kg)  SpO2: 97%  BMI (Calculated): 24.66    Physical Exam Vitals and nursing note reviewed.  Constitutional:      Appearance: Normal appearance.  HENT:  Head: Normocephalic and atraumatic.     Nose: Nose normal.     Mouth/Throat:     Mouth: Mucous membranes are moist.     Pharynx: Oropharynx is clear.  Eyes:     Conjunctiva/sclera: Conjunctivae normal.     Pupils: Pupils are equal, round, and reactive to light.  Cardiovascular:     Rate and Rhythm: Normal rate and regular rhythm.     Pulses: Normal pulses.     Heart sounds: Normal heart sounds. No murmur heard. Pulmonary:     Effort: Pulmonary effort is normal.     Breath sounds: Normal breath sounds. No wheezing.  Abdominal:     General: Bowel sounds are normal.     Palpations: Abdomen is soft.     Tenderness: There is no abdominal tenderness. There is no right CVA tenderness or left CVA tenderness.  Musculoskeletal:        General: Normal range of motion.     Cervical back: Normal range of motion.     Right lower leg: 1+ Pitting Edema present.     Left lower leg: 1+ Pitting Edema present.  Skin:    General: Skin is warm and dry.  Neurological:     General: No focal deficit present.     Mental Status: She is alert and oriented to person, place, and time.  Psychiatric:        Mood and Affect: Mood normal.        Behavior: Behavior normal.      Results for orders placed or performed in visit on 08/02/24  Microalbumin / creatinine urine ratio   Result Value Ref Range   Microalb Creat Ratio 1,205   Protein / creatinine ratio, urine  Result Value Ref Range   Creatinine, Urine 44    Albumin, U 53   POCT CBG (Fasting - Glucose)  Result Value Ref Range   Glucose Fasting, POC 115 (A) 70 - 99 mg/dL    Recent Results (from the past 2160 hours)  Microalbumin / creatinine urine ratio     Status: None   Collection Time: 07/24/24 10:20 AM  Result Value Ref Range   Microalb Creat Ratio 1,205   Protein / creatinine ratio, urine     Status: None   Collection Time: 07/24/24 10:20 AM  Result Value Ref Range   Creatinine, Urine 44    Albumin, U 53   POCT CBG (Fasting - Glucose)     Status: Abnormal   Collection Time: 08/02/24 10:30 AM  Result Value Ref Range   Glucose Fasting, POC 115 (A) 70 - 99 mg/dL      Assessment & Plan:  Start lasix 20 mg daily. Continue other medications as prescribed. Routine fasting blood work to be collected today and FU with patient on results. Flu shot given today. Keep recommended specialists appointments as scheduled. Problem List Items Addressed This Visit     Type 2 diabetes mellitus with hyperglycemia, with long-term current use of insulin  (HCC) - Primary   Relevant Orders   POCT CBG (Fasting - Glucose) (Completed)   Hemoglobin A1c   Hypertension associated with diabetes (HCC)   Relevant Medications   furosemide (LASIX) 20 MG tablet   Other Relevant Orders   CMP14+EGFR   CBC with Diff   Combined hyperlipidemia associated with type 2 diabetes mellitus (HCC)   Relevant Medications   furosemide (LASIX) 20 MG tablet   Other Relevant Orders   CMP14+EGFR   Lipid Panel w/o Chol/HDL Ratio  Stage 4 chronic kidney disease (HCC)   Relevant Medications   furosemide (LASIX) 20 MG tablet   Other Relevant Orders   CBC with Diff   Needs flu shot   Relevant Orders   Flu Vaccine Trivalent High Dose (Fluad) (Completed)    Return in about 10 days (around 08/12/2024).   Total time spent: 30  minutes.  This time includes review of previous notes and results and patient face to face interaction during today's visit.    FERNAND FREDY RAMAN, MD  08/02/2024   This document may have been prepared by [facility] Voice Recognition software and as such may include unintentional dictation errors.

## 2024-08-03 LAB — LIPID PANEL W/O CHOL/HDL RATIO
Cholesterol, Total: 127 mg/dL (ref 100–199)
HDL: 56 mg/dL (ref >39)
LDL Chol Calc (NIH): 57 mg/dL (ref 0–99)
Triglycerides: 68 mg/dL (ref 0–149)
VLDL Cholesterol Cal: 14 mg/dL (ref 5–40)

## 2024-08-03 LAB — CMP14+EGFR
ALT: 22 IU/L (ref 0–32)
AST: 23 IU/L (ref 0–40)
Albumin: 4.3 g/dL (ref 3.8–4.8)
Alkaline Phosphatase: 133 IU/L (ref 49–135)
BUN/Creatinine Ratio: 16 (ref 12–28)
BUN: 44 mg/dL — ABNORMAL HIGH (ref 8–27)
Bilirubin Total: 0.4 mg/dL (ref 0.0–1.2)
CO2: 19 mmol/L — ABNORMAL LOW (ref 20–29)
Calcium: 9.3 mg/dL (ref 8.7–10.3)
Chloride: 109 mmol/L — ABNORMAL HIGH (ref 96–106)
Creatinine, Ser: 2.71 mg/dL — ABNORMAL HIGH (ref 0.57–1.00)
Globulin, Total: 2.6 g/dL (ref 1.5–4.5)
Glucose: 135 mg/dL — ABNORMAL HIGH (ref 70–99)
Potassium: 4.6 mmol/L (ref 3.5–5.2)
Sodium: 141 mmol/L (ref 134–144)
Total Protein: 6.9 g/dL (ref 6.0–8.5)
eGFR: 17 mL/min/1.73 — ABNORMAL LOW (ref >59)

## 2024-08-03 LAB — CBC WITH DIFFERENTIAL/PLATELET
Basophils Absolute: 0 x10E3/uL (ref 0.0–0.2)
Basos: 1 %
EOS (ABSOLUTE): 0 x10E3/uL (ref 0.0–0.4)
Eos: 1 %
Hematocrit: 35.7 % (ref 34.0–46.6)
Hemoglobin: 11.4 g/dL (ref 11.1–15.9)
Immature Grans (Abs): 0.1 x10E3/uL (ref 0.0–0.1)
Immature Granulocytes: 1 %
Lymphocytes Absolute: 1.6 x10E3/uL (ref 0.7–3.1)
Lymphs: 23 %
MCH: 30.3 pg (ref 26.6–33.0)
MCHC: 31.9 g/dL (ref 31.5–35.7)
MCV: 95 fL (ref 79–97)
Monocytes Absolute: 0.8 x10E3/uL (ref 0.1–0.9)
Monocytes: 12 %
Neutrophils Absolute: 4.2 x10E3/uL (ref 1.4–7.0)
Neutrophils: 62 %
Platelets: 301 x10E3/uL (ref 150–450)
RBC: 3.76 x10E6/uL — ABNORMAL LOW (ref 3.77–5.28)
RDW: 13.9 % (ref 11.7–15.4)
WBC: 6.8 x10E3/uL (ref 3.4–10.8)

## 2024-08-03 LAB — HEMOGLOBIN A1C
Est. average glucose Bld gHb Est-mCnc: 206 mg/dL
Hgb A1c MFr Bld: 8.8 % — ABNORMAL HIGH (ref 4.8–5.6)

## 2024-08-06 NOTE — Progress Notes (Signed)
 Left VM to return call

## 2024-08-15 ENCOUNTER — Ambulatory Visit (INDEPENDENT_AMBULATORY_CARE_PROVIDER_SITE_OTHER): Admitting: Internal Medicine

## 2024-08-15 ENCOUNTER — Ambulatory Visit: Payer: Self-pay | Admitting: Internal Medicine

## 2024-08-15 ENCOUNTER — Encounter: Payer: Self-pay | Admitting: Internal Medicine

## 2024-08-15 VITALS — BP 132/60 | HR 76 | Ht 63.0 in | Wt 137.6 lb

## 2024-08-15 DIAGNOSIS — Z794 Long term (current) use of insulin: Secondary | ICD-10-CM

## 2024-08-15 DIAGNOSIS — E1169 Type 2 diabetes mellitus with other specified complication: Secondary | ICD-10-CM | POA: Diagnosis not present

## 2024-08-15 DIAGNOSIS — E782 Mixed hyperlipidemia: Secondary | ICD-10-CM | POA: Diagnosis not present

## 2024-08-15 DIAGNOSIS — I152 Hypertension secondary to endocrine disorders: Secondary | ICD-10-CM

## 2024-08-15 DIAGNOSIS — E1159 Type 2 diabetes mellitus with other circulatory complications: Secondary | ICD-10-CM | POA: Diagnosis not present

## 2024-08-15 DIAGNOSIS — N184 Chronic kidney disease, stage 4 (severe): Secondary | ICD-10-CM

## 2024-08-15 DIAGNOSIS — E1165 Type 2 diabetes mellitus with hyperglycemia: Secondary | ICD-10-CM

## 2024-08-15 LAB — POCT CBG (FASTING - GLUCOSE)-MANUAL ENTRY: Glucose Fasting, POC: 106 mg/dL — AB (ref 70–99)

## 2024-08-15 NOTE — Progress Notes (Signed)
 Established Patient Office Visit  Subjective:  Patient ID: Patricia Cross, female    DOB: [birth year]  Age: 78 y.o. MRN: [id number]  Chief Complaint  Patient presents with   Follow-up    10 day follow up    Patient is here for her follow up. She was started on Lasix at her last visit for leg edema. 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 She is still having the BLE edema but reports it had improved when she was taking it. Recommend patient getting compression stockings to wear daily while on the bus to help with edema and start taking the Lasix 20 mg in the evening.  Denies chest pain, shortness of breath, palpitations.     No other concerns at this time.   Past Medical History:  Diagnosis Date   Acute renal failure (ARF)    Anemia    Diabetes mellitus without complication (HCC) 10/18/1999   High cholesterol    Hypertension     Past Surgical History:  Procedure Laterality Date   ABDOMINAL HYSTERECTOMY  1981   BREAST BIOPSY Left 2005   neg core   BREAST BIOPSY Right 2015   neg core-Fibroadenoma   COLONOSCOPY WITH PROPOFOL  N/A 01/07/2016   Procedure: COLONOSCOPY WITH PROPOFOL ;  Surgeon: Deward CINDERELLA Piedmont, MD;  Location: [facility];  Service: Gastroenterology;  Laterality: N/A;   COLONOSCOPY WITH PROPOFOL  N/A 05/09/2018   Procedure: COLONOSCOPY WITH PROPOFOL ;  Surgeon: Toledo, Ladell POUR, MD;  Location: [facility];  Service: Gastroenterology;  Laterality: N/A;   ESOPHAGOGASTRODUODENOSCOPY (EGD) WITH PROPOFOL  N/A 05/09/2018   Procedure: ESOPHAGOGASTRODUODENOSCOPY (EGD) WITH PROPOFOL ;  Surgeon: Toledo, Ladell POUR, MD;  Location: [facility];  Service: Gastroenterology;  Laterality: N/A;   ESOPHAGOGASTRODUODENOSCOPY (EGD) WITH PROPOFOL  N/A 08/05/2022   Procedure: ESOPHAGOGASTRODUODENOSCOPY (EGD) WITH PROPOFOL ;  Surgeon: Jinny Carmine, MD;  Location: [facility];  Service: Endoscopy;   Laterality: N/A;   MICROLARYNGOSCOPY WITH CO2 LASER AND EXCISION OF VOCAL CORD LESION  1979   OOPHORECTOMY     one left, don't know which one   TUBAL LIGATION  1975    Social History   Socioeconomic History   Marital status: Widowed    Spouse name: Not on file   Number of children: Not on file   Years of education: Not on file   Highest education level: Not on file  Occupational History   Not on file  Tobacco Use   Smoking status: Never   Smokeless tobacco: Never  Vaping Use   Vaping status: Never Used  Substance and Sexual Activity   Alcohol  use: No   Drug use: No   Sexual activity: Not on file  Other Topics Concern   Not on file  Social History Narrative   Not on file   Social Drivers of Health   Financial Resource Strain: Not on file  Food Insecurity: No Food Insecurity (08/05/2022)   Hunger Vital Sign    Worried About Running Out of Food in the Last Year: Never true    Ran Out of Food in the Last Year: Never true  Transportation Needs: No Transportation Needs (08/05/2022)   PRAPARE - Administrator, Civil Service (Medical): No    Lack of Transportation (Non-Medical): No  Physical Activity: Not on file  Stress: Not on file  Social Connections: Not on file  Intimate Partner Violence: Not At Risk (08/05/2022)  Humiliation, Afraid, Rape, and Kick questionnaire    Fear of Current or Ex-Partner: No    Emotionally Abused: No    Physically Abused: No    Sexually Abused: No    Family History  Problem Relation Age of Onset   Hypertension Mother    Stroke Mother    Gout Father    Colon cancer Sister    Pancreatic cancer Daughter    Breast cancer Neg Hx     No Known Allergies  Outpatient Medications Prior to Visit  Medication Sig   amLODipine  (NORVASC ) 10 MG tablet Take 1 tablet by mouth once daily   ASPIRIN 81 PO Aspirin 81 MG Oral Tablet QTY: 0 tablet Days: 0 Refills: 0  Written: 09/07/18 Patient Instructions:   FARXIGA 10 MG TABS tablet Take  10 mg by mouth daily.   ferrous sulfate  325 (65 FE) MG EC tablet TAKE 1 TABLET BY MOUTH TWICE DAILY WITH A MEAL   furosemide (LASIX) 20 MG tablet Take 1 tablet (20 mg total) by mouth daily.   glipiZIDE  (GLUCOTROL  XL) 2.5 MG 24 hr tablet Take 1 tablet (2.5 mg total) by mouth 2 (two) times daily.   hydrALAZINE  (APRESOLINE ) 100 MG tablet Take 1 tablet (100 mg total) by mouth 2 (two) times daily.   insulin  lispro (HUMALOG  KWIKPEN) 100 UNIT/ML KwikPen Use with sliding scale before Lunch and Supper   LANTUS  SOLOSTAR 100 UNIT/ML Solostar Pen Inject 32 Units into the skin at bedtime.   losartan  (COZAAR ) 50 MG tablet Take 1 tablet (50 mg total) by mouth daily.   metoCLOPramide  (REGLAN ) 5 MG tablet Take 1 tablet by mouth twice daily   Multiple Vitamins-Minerals (CENTRUM SILVER) tablet Centrum Silver Oral Tablet QTY: 0 tablet Days: 0 Refills: 0  Written: 11/12/15 Patient Instructions:   pantoprazole  (PROTONIX ) 40 MG tablet Take 1 tablet (40 mg total) by mouth daily.   ReliOn Alcohol  Swabs  PADS 1 each by Does not apply route 2 (two) times daily.   rosuvastatin  (CRESTOR ) 40 MG tablet Take 1 tablet by mouth once daily   No facility-administered medications prior to visit.    Review of Systems  Constitutional: Negative.  Negative for chills, fever and malaise/fatigue.  HENT: Negative.  Negative for congestion and sore throat.   Eyes: Negative.  Negative for blurred vision and pain.  Respiratory: Negative.  Negative for cough and shortness of breath.   Cardiovascular: Negative.  Negative for chest pain, palpitations and leg swelling.  Gastrointestinal: Negative.  Negative for abdominal pain, blood in stool, constipation, diarrhea, heartburn, melena, nausea and vomiting.  Genitourinary: Negative.  Negative for dysuria, flank pain, frequency and urgency.  Musculoskeletal: Negative.  Negative for joint pain and myalgias.  Skin: Negative.   Neurological: Negative.  Negative for dizziness, tingling, sensory  change, weakness and headaches.  Endo/Heme/Allergies: Negative.   Psychiatric/Behavioral: Negative.  Negative for depression and suicidal ideas. The patient is not nervous/anxious.        Objective:   BP 132/60   Pulse 76   Ht 5' 3 (1.6 m)   Wt 137 lb 9.6 oz (62.4 kg)   SpO2 96%   BMI 24.37 kg/m   Vitals:   08/15/24 1008  BP: 132/60  Pulse: 76  Height: 5' 3 (1.6 m)  Weight: 137 lb 9.6 oz (62.4 kg)  SpO2: 96%  BMI (Calculated): 24.38    Physical Exam Vitals and nursing note reviewed.  Constitutional:      Appearance: Normal appearance.  HENT:  Head: Normocephalic and atraumatic.     Nose: Nose normal.     Mouth/Throat:     Mouth: Mucous membranes are moist.     Pharynx: Oropharynx is clear.  Eyes:     Conjunctiva/sclera: Conjunctivae normal.     Pupils: Pupils are equal, round, and reactive to light.  Cardiovascular:     Rate and Rhythm: Normal rate and regular rhythm.     Pulses: Normal pulses.     Heart sounds: Normal heart sounds. No murmur heard. Pulmonary:     Effort: Pulmonary effort is normal.     Breath sounds: Normal breath sounds. No wheezing.  Abdominal:     General: Bowel sounds are normal.     Palpations: Abdomen is soft.     Tenderness: There is no abdominal tenderness. There is no right CVA tenderness or left CVA tenderness.  Musculoskeletal:        General: Normal range of motion.     Cervical back: Normal range of motion.     Right lower leg: 1+ Pitting Edema present.     Left lower leg: 1+ Pitting Edema present.  Skin:    General: Skin is warm and dry.  Neurological:     General: No focal deficit present.     Mental Status: She is alert and oriented to person, place, and time.  Psychiatric:        Mood and Affect: Mood normal.        Behavior: Behavior normal.      Results for orders placed or performed in visit on 08/15/24  POCT CBG (Fasting - Glucose)  Result Value Ref Range   Glucose Fasting, POC 106 (A) 70 - 99 mg/dL     Recent Results (from the past 2160 hours)  Microalbumin / creatinine urine ratio     Status: None   Collection Time: 07/24/24 10:20 AM  Result Value Ref Range   Microalb Creat Ratio 1,205   Protein / creatinine ratio, urine     Status: None   Collection Time: 07/24/24 10:20 AM  Result Value Ref Range   Creatinine, Urine 44    Albumin, U 53   POCT CBG (Fasting - Glucose)     Status: Abnormal   Collection Time: 08/02/24 10:30 AM  Result Value Ref Range   Glucose Fasting, POC 115 (A) 70 - 99 mg/dL  [id number]     Status: Abnormal   Collection Time: 08/02/24 11:20 AM  Result Value Ref Range   Glucose 135 (H) 70 - 99 mg/dL   BUN 44 (H) 8 - 27 mg/dL   Creatinine, Ser 7.28 (H) 0.57 - 1.00 mg/dL   eGFR 17 (L) >40 fO/fpw/8.26   BUN/Creatinine Ratio 16 12 - 28   Sodium 141 134 - 144 mmol/L   Potassium 4.6 3.5 - 5.2 mmol/L   Chloride 109 (H) 96 - 106 mmol/L   CO2 19 (L) 20 - 29 mmol/L   Calcium  9.3 8.7 - 10.3 mg/dL   Total Protein 6.9 6.0 - 8.5 g/dL   Albumin 4.3 3.8 - 4.8 g/dL   Globulin, Total 2.6 1.5 - 4.5 g/dL   Bilirubin Total 0.4 0.0 - 1.2 mg/dL   Alkaline Phosphatase 133 49 - 135 IU/L   AST 23 0 - 40 IU/L   ALT 22 0 - 32 IU/L  Lipid Panel w/o Chol/HDL Ratio     Status: None   Collection Time: 08/02/24 11:20 AM  Result Value Ref Range   Cholesterol, Total 127  100 - 199 mg/dL   Triglycerides 68 0 - 149 mg/dL   HDL 56 >60 mg/dL   VLDL Cholesterol Cal 14 5 - 40 mg/dL   LDL Chol Calc (NIH) 57 0 - 99 mg/dL  Hemoglobin J8r     Status: Abnormal   Collection Time: 08/02/24 11:20 AM  Result Value Ref Range   Hgb A1c MFr Bld 8.8 (H) 4.8 - 5.6 %    Comment:          Prediabetes: 5.7 - 6.4          Diabetes: >6.4          Glycemic control for adults with diabetes: <7.0    Est. average glucose Bld gHb Est-mCnc 206 mg/dL  CBC with Diff     Status: Abnormal   Collection Time: 08/02/24 11:20 AM  Result Value Ref Range   WBC 6.8 3.4 - 10.8 x10E3/uL   RBC 3.76 (L) 3.77 - 5.28  x10E6/uL   Hemoglobin 11.4 11.1 - 15.9 g/dL   Hematocrit 64.2 65.9 - 46.6 %   MCV 95 79 - 97 fL   MCH 30.3 26.6 - 33.0 pg   MCHC 31.9 31.5 - 35.7 g/dL   RDW 86.0 88.2 - 84.5 %   Platelets 301 150 - 450 x10E3/uL   Neutrophils 62 Not Estab. %   Lymphs 23 Not Estab. %   Monocytes 12 Not Estab. %   Eos 1 Not Estab. %   Basos 1 Not Estab. %   Neutrophils Absolute 4.2 1.4 - 7.0 x10E3/uL   Lymphocytes Absolute 1.6 0.7 - 3.1 x10E3/uL   Monocytes Absolute 0.8 0.1 - 0.9 x10E3/uL   EOS (ABSOLUTE) 0.0 0.0 - 0.4 x10E3/uL   Basophils Absolute 0.0 0.0 - 0.2 x10E3/uL   Immature Granulocytes 1 Not Estab. %   Immature Grans (Abs) 0.1 0.0 - 0.1 x10E3/uL  POCT CBG (Fasting - Glucose)     Status: Abnormal   Collection Time: 08/15/24 10:12 AM  Result Value Ref Range   Glucose Fasting, POC 106 (A) 70 - 99 mg/dL      Assessment & Plan:  Start taking lasix 20 mg in the evening. Recommended compression stockings daily. Reinforced healthy diet and exercise as tolerated. Check BMP when she returns. Problem List Items Addressed This Visit     Type 2 diabetes mellitus with hyperglycemia, with long-term current use of insulin  (HCC) - Primary   Relevant Orders   POCT CBG (Fasting - Glucose) (Completed)   Hypertension associated with diabetes (HCC)   Combined hyperlipidemia associated with type 2 diabetes mellitus (HCC)   Stage 4 chronic kidney disease (HCC)    Return in about 2 weeks (around 08/29/2024).   Total time spent: 25 minutes. This time includes review of previous notes and results and patient face to face interaction during today's visit.    FERNAND FREDY RAMAN, MD  08/15/2024   This document may have been prepared by [facility] Voice Recognition software and as such may include unintentional dictation errors.

## 2024-08-23 ENCOUNTER — Other Ambulatory Visit: Payer: Self-pay | Admitting: Internal Medicine

## 2024-08-23 DIAGNOSIS — R110 Nausea: Secondary | ICD-10-CM

## 2024-08-27 ENCOUNTER — Other Ambulatory Visit: Payer: Self-pay | Admitting: Internal Medicine

## 2024-08-27 DIAGNOSIS — E782 Mixed hyperlipidemia: Secondary | ICD-10-CM

## 2024-08-29 ENCOUNTER — Ambulatory Visit: Admitting: Internal Medicine

## 2024-09-24 ENCOUNTER — Other Ambulatory Visit: Payer: Self-pay | Admitting: Internal Medicine

## 2024-09-24 DIAGNOSIS — E1165 Type 2 diabetes mellitus with hyperglycemia: Secondary | ICD-10-CM

## 2024-10-23 ENCOUNTER — Other Ambulatory Visit: Payer: Self-pay | Admitting: Internal Medicine

## 2024-10-23 DIAGNOSIS — I152 Hypertension secondary to endocrine disorders: Secondary | ICD-10-CM

## 2024-11-11 ENCOUNTER — Other Ambulatory Visit: Payer: Self-pay | Admitting: Internal Medicine

## 2024-11-11 DIAGNOSIS — E1165 Type 2 diabetes mellitus with hyperglycemia: Secondary | ICD-10-CM
